# Patient Record
Sex: Female | Born: 1935 | Race: Black or African American | Hispanic: No | Smoking: Former smoker
Health system: Southern US, Community
[De-identification: ages and names within clinical notes are randomized; demographics above are authoritative.]

## PROBLEM LIST (undated history)

## (undated) DIAGNOSIS — G629 Polyneuropathy, unspecified: Secondary | ICD-10-CM

## (undated) DIAGNOSIS — C7931 Secondary malignant neoplasm of brain: Secondary | ICD-10-CM

## (undated) DIAGNOSIS — E785 Hyperlipidemia, unspecified: Secondary | ICD-10-CM

## (undated) DIAGNOSIS — M1990 Unspecified osteoarthritis, unspecified site: Secondary | ICD-10-CM

## (undated) DIAGNOSIS — Z905 Acquired absence of kidney: Secondary | ICD-10-CM

## (undated) DIAGNOSIS — G479 Sleep disorder, unspecified: Secondary | ICD-10-CM

## (undated) DIAGNOSIS — C3490 Malignant neoplasm of unspecified part of unspecified bronchus or lung: Secondary | ICD-10-CM

## (undated) DIAGNOSIS — R200 Anesthesia of skin: Secondary | ICD-10-CM

## (undated) DIAGNOSIS — Z5111 Encounter for antineoplastic chemotherapy: Secondary | ICD-10-CM

## (undated) DIAGNOSIS — K635 Polyp of colon: Secondary | ICD-10-CM

## (undated) DIAGNOSIS — C3492 Malignant neoplasm of unspecified part of left bronchus or lung: Principal | ICD-10-CM

## (undated) DIAGNOSIS — Z86711 Personal history of pulmonary embolism: Secondary | ICD-10-CM

## (undated) DIAGNOSIS — Z923 Personal history of irradiation: Secondary | ICD-10-CM

## (undated) HISTORY — DX: Malignant neoplasm of unspecified part of left bronchus or lung: C34.92

## (undated) HISTORY — DX: Polyp of colon: K63.5

## (undated) HISTORY — DX: Personal history of irradiation: Z92.3

## (undated) HISTORY — PX: ABDOMINAL HYSTERECTOMY: SHX81

## (undated) HISTORY — DX: Secondary malignant neoplasm of brain: C79.31

## (undated) HISTORY — DX: Encounter for antineoplastic chemotherapy: Z51.11

## (undated) HISTORY — PX: APPENDECTOMY: SHX54

## (undated) HISTORY — DX: Hyperlipidemia, unspecified: E78.5

## (undated) HISTORY — DX: Polyneuropathy, unspecified: G62.9

---

## 1991-10-21 HISTORY — PX: NEPHRECTOMY: SHX65

## 1992-10-20 DIAGNOSIS — Z905 Acquired absence of kidney: Secondary | ICD-10-CM

## 1992-10-20 HISTORY — DX: Acquired absence of kidney: Z90.5

## 2003-11-21 ENCOUNTER — Encounter: Admission: RE | Admit: 2003-11-21 | Discharge: 2003-11-21 | Payer: Self-pay | Admitting: Internal Medicine

## 2004-02-27 ENCOUNTER — Encounter: Admission: RE | Admit: 2004-02-27 | Discharge: 2004-02-27 | Payer: Self-pay | Admitting: Internal Medicine

## 2004-04-10 ENCOUNTER — Ambulatory Visit (HOSPITAL_COMMUNITY): Admission: RE | Admit: 2004-04-10 | Discharge: 2004-04-10 | Payer: Self-pay | Admitting: Gastroenterology

## 2005-03-06 ENCOUNTER — Encounter: Admission: RE | Admit: 2005-03-06 | Discharge: 2005-03-06 | Payer: Self-pay | Admitting: Internal Medicine

## 2005-10-31 ENCOUNTER — Encounter: Admission: RE | Admit: 2005-10-31 | Discharge: 2005-10-31 | Payer: Self-pay | Admitting: Internal Medicine

## 2006-04-03 ENCOUNTER — Encounter: Admission: RE | Admit: 2006-04-03 | Discharge: 2006-04-03 | Payer: Self-pay | Admitting: Internal Medicine

## 2007-05-10 ENCOUNTER — Encounter: Admission: RE | Admit: 2007-05-10 | Discharge: 2007-05-10 | Payer: Self-pay | Admitting: *Deleted

## 2008-04-01 ENCOUNTER — Emergency Department (HOSPITAL_COMMUNITY): Admission: EM | Admit: 2008-04-01 | Discharge: 2008-04-02 | Payer: Self-pay | Admitting: Emergency Medicine

## 2008-04-05 ENCOUNTER — Encounter: Admission: RE | Admit: 2008-04-05 | Discharge: 2008-04-05 | Payer: Self-pay | Admitting: Family Medicine

## 2008-05-10 ENCOUNTER — Encounter: Admission: RE | Admit: 2008-05-10 | Discharge: 2008-05-10 | Payer: Self-pay | Admitting: Family Medicine

## 2008-07-20 ENCOUNTER — Encounter: Admission: RE | Admit: 2008-07-20 | Discharge: 2008-08-21 | Payer: Self-pay | Admitting: Family Medicine

## 2009-10-20 DIAGNOSIS — Z86711 Personal history of pulmonary embolism: Secondary | ICD-10-CM

## 2009-10-20 HISTORY — DX: Personal history of pulmonary embolism: Z86.711

## 2009-11-05 ENCOUNTER — Encounter: Admission: RE | Admit: 2009-11-05 | Discharge: 2009-11-29 | Payer: Self-pay | Admitting: Family Medicine

## 2010-01-07 ENCOUNTER — Encounter: Admission: RE | Admit: 2010-01-07 | Discharge: 2010-01-07 | Payer: Self-pay | Admitting: Orthopedic Surgery

## 2010-02-23 ENCOUNTER — Encounter: Admission: RE | Admit: 2010-02-23 | Discharge: 2010-02-23 | Payer: Self-pay | Admitting: Orthopedic Surgery

## 2010-03-01 ENCOUNTER — Encounter: Admission: RE | Admit: 2010-03-01 | Discharge: 2010-03-01 | Payer: Self-pay | Admitting: Orthopedic Surgery

## 2010-03-15 ENCOUNTER — Encounter: Admission: RE | Admit: 2010-03-15 | Discharge: 2010-03-15 | Payer: Self-pay | Admitting: Orthopedic Surgery

## 2010-07-12 ENCOUNTER — Encounter: Admission: RE | Admit: 2010-07-12 | Discharge: 2010-07-12 | Payer: Self-pay | Admitting: Orthopedic Surgery

## 2010-08-06 ENCOUNTER — Inpatient Hospital Stay (HOSPITAL_COMMUNITY): Admission: EM | Admit: 2010-08-06 | Discharge: 2010-08-09 | Payer: Self-pay | Admitting: Emergency Medicine

## 2010-11-10 ENCOUNTER — Encounter: Payer: Self-pay | Admitting: Orthopedic Surgery

## 2011-01-01 LAB — POCT I-STAT, CHEM 8
BUN: 15 mg/dL (ref 6–23)
BUN: 17 mg/dL (ref 6–23)
Calcium, Ion: 1.04 mmol/L — ABNORMAL LOW (ref 1.12–1.32)
Calcium, Ion: 1.12 mmol/L (ref 1.12–1.32)
Chloride: 108 meq/L (ref 96–112)
Chloride: 111 mEq/L (ref 96–112)
Creatinine, Ser: 0.9 mg/dL (ref 0.4–1.2)
Creatinine, Ser: 1 mg/dL (ref 0.4–1.2)
Glucose, Bld: 102 mg/dL — ABNORMAL HIGH (ref 70–99)
Glucose, Bld: 121 mg/dL — ABNORMAL HIGH (ref 70–99)
HCT: 44 % (ref 36.0–46.0)
HCT: 45 % (ref 36.0–46.0)
Hemoglobin: 15 g/dL (ref 12.0–15.0)
Hemoglobin: 15.3 g/dL — ABNORMAL HIGH (ref 12.0–15.0)
Potassium: 3.9 meq/L (ref 3.5–5.1)
Potassium: 4 mEq/L (ref 3.5–5.1)
Sodium: 138 mEq/L (ref 135–145)
Sodium: 139 mEq/L (ref 135–145)
TCO2: 22 mmol/L (ref 0–100)
TCO2: 22 mmol/L (ref 0–100)

## 2011-01-01 LAB — BASIC METABOLIC PANEL
BUN: 16 mg/dL (ref 6–23)
CO2: 19 mEq/L (ref 19–32)
CO2: 25 mEq/L (ref 19–32)
Calcium: 9 mg/dL (ref 8.4–10.5)
Chloride: 109 mEq/L (ref 96–112)
Creatinine, Ser: 0.9 mg/dL (ref 0.4–1.2)
GFR calc Af Amer: >60 mL/min (ref >60)
Glucose, Bld: 106 mg/dL — ABNORMAL HIGH (ref 70–99)
Potassium: 4 mEq/L (ref 3.5–5.1)

## 2011-01-01 LAB — DIFFERENTIAL
Eosinophils Relative: 3 % (ref 0–5)
Lymphocytes Relative: 28 % (ref 12–46)
Lymphs Abs: 2.5 10*3/uL (ref 0.7–4.0)
Monocytes Relative: 11 % (ref 3–12)
Neutrophils Relative %: 58 % (ref 43–77)

## 2011-01-01 LAB — LUPUS ANTICOAGULANT PANEL
DRVVT: 49.7 s — ABNORMAL HIGH (ref 36.2–44.3)
Lupus Anticoagulant: NOT DETECTED
PTT Lupus Anticoagulant: 42.7 secs (ref 30.0–45.6)
dRVVT Incubated 1:1 Mix: 42.6 s (ref 36.2–44.3)

## 2011-01-01 LAB — POCT CARDIAC MARKERS
CKMB, poc: <1 ng/mL — ABNORMAL LOW (ref 1.0–8.0)
CKMB, poc: <1 ng/mL — ABNORMAL LOW (ref 1.0–8.0)
Myoglobin, poc: 62.6 ng/mL (ref 12–200)
Myoglobin, poc: 62.7 ng/mL (ref 12–200)
Troponin i, poc: <0.05 ng/mL (ref 0.00–0.09)
Troponin i, poc: <0.05 ng/mL (ref 0.00–0.09)

## 2011-01-01 LAB — CK TOTAL AND CKMB (NOT AT ARMC): CK, MB: 1.3 ng/mL (ref 0.3–4.0)

## 2011-01-01 LAB — BETA-2-GLYCOPROTEIN I ABS, IGG/M/A
Beta-2 Glyco I IgG: 1 G Units (ref <20)
Beta-2-Glycoprotein I IgA: 4 A Units (ref <20)
Beta-2-Glycoprotein I IgM: 1 M Units (ref <20)

## 2011-01-01 LAB — CARDIOLIPIN ANTIBODIES, IGG, IGM, IGA
Anticardiolipin IgA: 6 APL U/mL — ABNORMAL LOW (ref <22)
Anticardiolipin IgG: 31 GPL U/mL — ABNORMAL HIGH (ref <23)
Anticardiolipin IgM: 1 [MPL'U]/mL — ABNORMAL LOW (ref <11)

## 2011-01-01 LAB — CARDIAC PANEL(CRET KIN+CKTOT+MB+TROPI)
CK, MB: 1.3 ng/mL (ref 0.3–4.0)
Relative Index: INVALID (ref 0.0–2.5)
Relative Index: INVALID (ref 0.0–2.5)
Troponin I: 0.01 ng/mL (ref 0.00–0.06)

## 2011-01-01 LAB — PROTEIN C ACTIVITY: Protein C Activity: 127 % (ref 75–133)

## 2011-01-01 LAB — CBC
HCT: 41.8 % (ref 36.0–46.0)
HCT: 42.4 % (ref 36.0–46.0)
Hemoglobin: 14.7 g/dL (ref 12.0–15.0)
MCH: 31 pg (ref 26.0–34.0)
MCH: 32.1 pg (ref 26.0–34.0)
MCHC: 34.7 g/dL (ref 30.0–36.0)
MCV: 90 fL (ref 78.0–100.0)
MCV: 90.5 fL (ref 78.0–100.0)
Platelets: 184 10*3/uL (ref 150–400)
Platelets: 242 10*3/uL (ref 150–400)
RBC: 4.62 MIL/uL (ref 3.87–5.11)
RDW: 13.2 % (ref 11.5–15.5)
WBC: 8.2 10*3/uL (ref 4.0–10.5)
WBC: 8.9 10*3/uL (ref 4.0–10.5)

## 2011-01-01 LAB — TSH: TSH: 1.574 u[IU]/mL (ref 0.350–4.500)

## 2011-01-01 LAB — PROTIME-INR
INR: 1.12 (ref 0.00–1.49)
Prothrombin Time: 14.1 seconds (ref 11.6–15.2)
Prothrombin Time: 14.6 seconds (ref 11.6–15.2)
Prothrombin Time: 15.7 seconds — ABNORMAL HIGH (ref 11.6–15.2)

## 2011-01-01 LAB — ANTITHROMBIN III: AntiThromb III Func: 108 % (ref 76–126)

## 2011-01-01 LAB — LIPID PANEL
HDL: 45 mg/dL (ref >39)
Total CHOL/HDL Ratio: 3.8 RATIO

## 2011-01-01 LAB — PROTEIN C, TOTAL: Protein C, Total: 105 % (ref 70–140)

## 2011-01-01 LAB — APTT: aPTT: 33 seconds (ref 24–37)

## 2011-01-01 LAB — HOMOCYSTEINE: Homocysteine: 11 umol/L (ref 4.0–15.4)

## 2011-01-01 LAB — PROTHROMBIN GENE MUTATION

## 2011-01-01 LAB — TROPONIN I: Troponin I: 0.02 ng/mL (ref 0.00–0.06)

## 2011-01-01 LAB — PROTEIN S ACTIVITY: Protein S Activity: 117 % (ref 69–129)

## 2011-01-01 LAB — PROTEIN S, TOTAL: Protein S Ag, Total: 191 % — ABNORMAL HIGH (ref 70–140)

## 2011-01-01 LAB — FACTOR 5 LEIDEN

## 2011-03-07 NOTE — Op Note (Signed)
[name], [name] NO.:  [id number]   MEDICAL RECORD NO.:  [id number]                   PATIENT TYPE:  AMB   LOCATION:  ENDO                                 FACILITY:  [facility]   PHYSICIAN:  Graylin Shiver, M.D.                DATE OF BIRTH:  22-Jun-1928   DATE OF PROCEDURE:  04/10/2004  DATE OF DISCHARGE:                                 OPERATIVE REPORT   PROCEDURE PERFORMED:  Colonoscopy.   INDICATIONS FOR PROCEDURE:  History of colon polyps.  Family history of  colon cancer.   Informed consent was obtained after explanation of the risks of bleeding,  infection, and perforation.   PREMEDICATIONS:  Fentanyl 60 mcg  IV, Versed 6 mg IV.   DESCRIPTION OF PROCEDURE:  With the patient in the left lateral decubitus  position, a rectal exam was performed and no masses were felt.  The Olympus  colonoscope was inserted into the rectum and advanced around the colon to  the cecum.  Cecal landmarks were identified.  The cecum and ascending colon  were normal.  The transverse colon normal.  The descending colon normal.  The sigmoid showed diverticulosis.  The rectum was normal.  The patient  tolerated the procedure well without complications.   IMPRESSION:  Diverticulosis of the sigmoid.   RECOMMENDATIONS:  I would recommend a follow-up colonoscopy again in five  years.                                               Graylin Shiver, M.D.    [name]  D:  04/10/2004  T:  04/11/2004  Job:  [id number]   cc:   Marcene Duos, M.D.  [place]  [place]  [place] [address]  Fax: [phone]

## 2011-04-28 ENCOUNTER — Other Ambulatory Visit: Payer: Self-pay | Admitting: Family Medicine

## 2011-04-28 DIAGNOSIS — Z780 Asymptomatic menopausal state: Secondary | ICD-10-CM

## 2011-04-28 DIAGNOSIS — Z1231 Encounter for screening mammogram for malignant neoplasm of breast: Secondary | ICD-10-CM

## 2011-05-21 ENCOUNTER — Ambulatory Visit
Admission: RE | Admit: 2011-05-21 | Discharge: 2011-05-21 | Disposition: A | Discharge status: Home / Self Care | Payer: BC Managed Care – PPO | Source: Ambulatory Visit | Attending: Family Medicine | Admitting: Family Medicine

## 2011-05-21 DIAGNOSIS — Z1231 Encounter for screening mammogram for malignant neoplasm of breast: Secondary | ICD-10-CM

## 2011-05-21 DIAGNOSIS — Z780 Asymptomatic menopausal state: Secondary | ICD-10-CM

## 2012-06-09 ENCOUNTER — Other Ambulatory Visit: Payer: Self-pay | Admitting: Family Medicine

## 2012-06-09 DIAGNOSIS — Z1231 Encounter for screening mammogram for malignant neoplasm of breast: Secondary | ICD-10-CM

## 2012-06-15 ENCOUNTER — Ambulatory Visit
Admission: RE | Admit: 2012-06-15 | Discharge: 2012-06-15 | Disposition: A | Discharge status: Home / Self Care | Payer: Medicare Other | Source: Ambulatory Visit | Attending: Family Medicine | Admitting: Family Medicine

## 2012-06-15 DIAGNOSIS — Z1231 Encounter for screening mammogram for malignant neoplasm of breast: Secondary | ICD-10-CM

## 2013-01-05 ENCOUNTER — Other Ambulatory Visit: Payer: Self-pay | Admitting: Neurology

## 2013-01-05 ENCOUNTER — Other Ambulatory Visit: Payer: Self-pay

## 2013-01-05 ENCOUNTER — Ambulatory Visit (INDEPENDENT_AMBULATORY_CARE_PROVIDER_SITE_OTHER): Payer: Medicare Other | Admitting: Neurology

## 2013-01-05 ENCOUNTER — Encounter: Payer: Self-pay | Admitting: Neurology

## 2013-01-05 VITALS — BP 132/84 | HR 93 | Ht 67.0 in | Wt 273.0 lb

## 2013-01-05 DIAGNOSIS — G609 Hereditary and idiopathic neuropathy, unspecified: Secondary | ICD-10-CM

## 2013-01-05 NOTE — Patient Instructions (Addendum)
Laboratory evaluation today,  Return to clinic for EMG nerve conduction study. Tapering off Lyrica 75 mg over 1-2 weeks

## 2013-01-05 NOTE — Progress Notes (Signed)
[name] is apresent 79 years old right-handed Caucasian female, referred by her primary care physician Dr. Rosario Adie from [facility] for evaluation of peripheral neuropathy.  She has past medical history of bilateral pulmonary emboli, on chronic Coumadin treatment, hyperlipidemia, previous history of severe endometriosis, with total hysterectomy, also right nephrectomy due to erosion from endometriosis, chronic right hip pain, is considering right hip replacement  She reports 5 years history of carotid onset bilateral feet paresthesia, initial symptoms was left toes, plantar surface, over the past 4 years, gradually involving her right foot, occasionally tingling discomfort at the distal leg, she now complains of constant bilateral toes, plantar foot numbness, no pain, no needle prick sensation, no burning, she has mild gait difficulty due to the right hip pain, she denies significant neck pain, no incontinence, no finger paresthesia  Review of system: She complains of numbness, otherwise deny constitutional, cardiac, pulmonary, ENT, skin, respiratory, GI, endocrinology complains,   PHYSICAL EXAMINATOINS:  Generalized: In no acute distress  Neck: Supple, no carotid bruits   Cardiac: Regular rate rhythm  Pulmonary: Clear to auscultation bilaterally  Musculoskeletal: No deformity  Neurological examination  Mentation: Alert oriented to time, place, history taking, and causual conversation  Cranial nerve II-XII: Pupils were equal round reactive to light extraocular movements were full, visual field were full on confrontational test. facial sensation and strength were normal. hearing was intact to finger rubbing bilaterally. Uvula tongue midline.  head turning and shoulder shrug and were normal and symmetric.Tongue protrusion into cheek strength was normal.  Motor: normal tone, bulk and strength.  Sensory: mildly length dependent decreased light touch, pinprick, preserved vibratory  sensation, and proprioception at toes.  Coordination: Normal finger to nose, heel-to-shin bilaterally there was no truncal ataxia  Gait: Rising up from seated position by pushing in chair arm, normal stance, without trunk ataxia, moderate stride, dragging right leg, limp due to right hip pain. Romberg signs: Negative  Deep tendon reflexes: Brachioradialis 2/2, biceps 2/2, triceps 2/2, patellar 2/2, Achilles 2/2, plantar responses were flexor bilaterally.  Assessment and plan:  79 years old Caucasian female, with gradual onset bilateral feet paresthesia, most consistent with small fiber neuropathy, she wants to complete evaluation before proceeding to right hip replacement,   1, EMG nerve conduction study 2 laboratory evaluation.

## 2013-01-06 LAB — PROTEIN ELECTROPHORESIS, SERUM
A/G Ratio: 1.1 (ref 0.7–2.0)
Albumin ELP: 3.6 g/dL (ref 3.2–5.6)
Gamma Globulin: 1.4 g/dL (ref 0.5–1.6)
Globulin, Total: 3.3 g/dL (ref 2.0–4.5)
Total Protein: 6.9 g/dL (ref 6.0–8.5)

## 2013-01-06 LAB — CBC

## 2013-01-07 ENCOUNTER — Encounter: Payer: Self-pay | Admitting: Neurology

## 2013-01-07 LAB — CBC
HCT: 43.3 % (ref 34.0–46.6)
MCV: 92 fL (ref 79–97)
RBC: 4.71 x10E6/uL (ref 3.77–5.28)
RDW: 15.2 % (ref 12.3–15.4)
WBC: 7.8 10*3/uL (ref 3.4–10.8)

## 2013-01-07 LAB — SPECIMEN STATUS REPORT

## 2013-01-10 ENCOUNTER — Telehealth: Payer: Self-pay

## 2013-01-10 NOTE — Telephone Encounter (Signed)
Patient wants to no about her lab results what labs did she have done .

## 2013-01-10 NOTE — Telephone Encounter (Signed)
Please call her, CMP, CBC, protein electrophoresis, RPR, Vitamin B12 level were all normal.

## 2013-01-20 NOTE — Telephone Encounter (Signed)
Called and spoke to patient normal labs. Patient wants labs to be faxed to her PCP.

## 2013-01-28 ENCOUNTER — Encounter: Payer: Medicare Other | Admitting: Radiology

## 2013-01-28 ENCOUNTER — Ambulatory Visit (INDEPENDENT_AMBULATORY_CARE_PROVIDER_SITE_OTHER): Payer: Medicare Other | Admitting: Neurology

## 2013-01-28 ENCOUNTER — Encounter (INDEPENDENT_AMBULATORY_CARE_PROVIDER_SITE_OTHER): Payer: Medicare Other

## 2013-01-28 DIAGNOSIS — R200 Anesthesia of skin: Secondary | ICD-10-CM

## 2013-01-28 DIAGNOSIS — R209 Unspecified disturbances of skin sensation: Secondary | ICD-10-CM

## 2013-01-28 DIAGNOSIS — Z0289 Encounter for other administrative examinations: Secondary | ICD-10-CM

## 2013-01-28 NOTE — Procedures (Signed)
79 year old female, with chronic right hip pain, a candidate for right hip replacement, also presenting with two-month history of bilateral feet paresthesia, numbness tingling at the bottom of her feet, initially only involving her left foot, now also involving her right foot, she denies significant low back pain  Examination: Bilateral lower  Extremity motor strength is normal, mildly length dependent sensory changes, trace ankle reflex  Nerve conduction studies:  Bilateral peroneal sensory responses were normal. Bilateral peroneal to EDB motor responses were normal.  Bilateral tibial motor responses were normal.  bilateral tibial H. reflexes were normal and symmetric,  Electromyography:  Selected needle examination was performed at left lower extremity muscles, and left lumbosacral paraspinal muscles.  Needle examination of left tibialis anterior, tibialis posterior, peroneal longus, vastus lateralis was normal,  There was no spontaneous activity at left lumbosacral paraspinal muscles, left L4, L5, S1  In conclusion:  This is a normal study, there is no electrodiagnostic evidence of large fiber peripheral neuropathy, or left lumbosacral radiculopathy, patient's symptoms suggestive of a small fiber neuropathy

## 2013-02-09 ENCOUNTER — Other Ambulatory Visit (HOSPITAL_COMMUNITY): Payer: Self-pay | Admitting: Orthopaedic Surgery

## 2013-02-16 ENCOUNTER — Encounter (HOSPITAL_COMMUNITY): Payer: Self-pay | Admitting: Pharmacy Technician

## 2013-02-17 ENCOUNTER — Encounter (HOSPITAL_COMMUNITY): Payer: Self-pay

## 2013-02-17 ENCOUNTER — Other Ambulatory Visit (HOSPITAL_COMMUNITY): Payer: Self-pay | Admitting: *Deleted

## 2013-02-17 ENCOUNTER — Encounter (HOSPITAL_COMMUNITY)
Admission: RE | Admit: 2013-02-17 | Discharge: 2013-02-17 | Disposition: A | Discharge status: Home / Self Care | Payer: Medicare Other | Source: Ambulatory Visit | Attending: Orthopaedic Surgery | Admitting: Orthopaedic Surgery

## 2013-02-17 ENCOUNTER — Ambulatory Visit (HOSPITAL_COMMUNITY)
Admission: RE | Admit: 2013-02-17 | Discharge: 2013-02-17 | Disposition: A | Discharge status: Home / Self Care | Payer: Medicare Other | Source: Ambulatory Visit | Attending: Orthopaedic Surgery | Admitting: Orthopaedic Surgery

## 2013-02-17 DIAGNOSIS — Z01812 Encounter for preprocedural laboratory examination: Secondary | ICD-10-CM | POA: Insufficient documentation

## 2013-02-17 DIAGNOSIS — M169 Osteoarthritis of hip, unspecified: Secondary | ICD-10-CM | POA: Insufficient documentation

## 2013-02-17 DIAGNOSIS — Z01818 Encounter for other preprocedural examination: Secondary | ICD-10-CM | POA: Insufficient documentation

## 2013-02-17 DIAGNOSIS — Z86711 Personal history of pulmonary embolism: Secondary | ICD-10-CM | POA: Insufficient documentation

## 2013-02-17 DIAGNOSIS — M1610 Unilateral primary osteoarthritis, unspecified hip: Secondary | ICD-10-CM | POA: Insufficient documentation

## 2013-02-17 DIAGNOSIS — Z0183 Encounter for blood typing: Secondary | ICD-10-CM | POA: Insufficient documentation

## 2013-02-17 DIAGNOSIS — R82998 Other abnormal findings in urine: Secondary | ICD-10-CM | POA: Insufficient documentation

## 2013-02-17 DIAGNOSIS — R911 Solitary pulmonary nodule: Secondary | ICD-10-CM | POA: Insufficient documentation

## 2013-02-17 DIAGNOSIS — I700 Atherosclerosis of aorta: Secondary | ICD-10-CM | POA: Insufficient documentation

## 2013-02-17 DIAGNOSIS — Z87891 Personal history of nicotine dependence: Secondary | ICD-10-CM | POA: Insufficient documentation

## 2013-02-17 HISTORY — DX: Anesthesia of skin: R20.0

## 2013-02-17 HISTORY — DX: Personal history of pulmonary embolism: Z86.711

## 2013-02-17 HISTORY — DX: Sleep disorder, unspecified: G47.9

## 2013-02-17 HISTORY — DX: Acquired absence of kidney: Z90.5

## 2013-02-17 HISTORY — DX: Unspecified osteoarthritis, unspecified site: M19.90

## 2013-02-17 LAB — BASIC METABOLIC PANEL
Calcium: 9.6 mg/dL (ref 8.4–10.5)
GFR calc non Af Amer: 47 mL/min — ABNORMAL LOW (ref >90)
Potassium: 5.1 mEq/L (ref 3.5–5.1)
Sodium: 138 mEq/L (ref 135–145)

## 2013-02-17 LAB — URINALYSIS, ROUTINE W REFLEX MICROSCOPIC
Nitrite: NEGATIVE
Specific Gravity, Urine: 1.017 (ref 1.005–1.030)
pH: 5 (ref 5.0–8.0)

## 2013-02-17 LAB — PROTIME-INR
INR: 2.41 — ABNORMAL HIGH (ref 0.00–1.49)
Prothrombin Time: 25.1 seconds — ABNORMAL HIGH (ref 11.6–15.2)

## 2013-02-17 LAB — APTT: aPTT: 52 seconds — ABNORMAL HIGH (ref 24–37)

## 2013-02-17 LAB — CBC
Platelets: 249 10*3/uL (ref 150–400)
RBC: 4.86 MIL/uL (ref 3.87–5.11)
WBC: 7 10*3/uL (ref 4.0–10.5)

## 2013-02-17 LAB — URINE MICROSCOPIC-ADD ON

## 2013-02-17 NOTE — Patient Instructions (Addendum)
TIMBERLY YOTT  02/17/2013                           YOUR PROCEDURE IS SCHEDULED ON: 02/25/13               PLEASE REPORT TO SHORT STAY CENTER AT : 7:30 AM               CALL THIS NUMBER IF ANY PROBLEMS THE DAY OF SURGERY :               [phone]                      REMEMBER:   Do not eat food or drink liquids AFTER MIDNIGHT   Take these medicines the morning of surgery with A SIP OF WATER:  METOPROLOL   Do not wear jewelry, make-up   Do not wear lotions, powders, or perfumes.   Do not shave legs or underarms 12 hrs. before surgery (men may shave face)  Do not bring valuables to the hospital.  Contacts, dentures or bridgework may not be worn into surgery.  Leave suitcase in the car. After surgery it may be brought to your room.  For patients admitted to the hospital more than one night, checkout time is 11:00                          The day of discharge.   Patients discharged the day of surgery will not be allowed to drive home                             If going home same day of surgery, must have someone stay with you first                           24 hrs at home and arrange for some one to drive you home from hospital.    Special Instructions:   Please read over the following fact sheets that you were given:               1. MRSA  INFORMATION                      2.  PREPARING FOR SURGERY SHEET                                                X_____________________________________________________________________        Failure to follow these instructions may result in cancellation of your surgery

## 2013-02-18 NOTE — Progress Notes (Signed)
Abnormal UA faxed to Dr, Magnus Ivan -also spoke with Cordelia Pen at office - to review UA

## 2013-02-19 LAB — URINE CULTURE

## 2013-02-25 ENCOUNTER — Encounter (HOSPITAL_COMMUNITY): Payer: Self-pay | Admitting: Anesthesiology

## 2013-02-25 ENCOUNTER — Encounter (HOSPITAL_COMMUNITY)
Admission: RE | Disposition: A | Discharge status: Skilled Nursing Facility | Payer: Self-pay | Source: Ambulatory Visit | Attending: Orthopaedic Surgery

## 2013-02-25 ENCOUNTER — Inpatient Hospital Stay (HOSPITAL_COMMUNITY): Payer: Medicare Other

## 2013-02-25 ENCOUNTER — Inpatient Hospital Stay (HOSPITAL_COMMUNITY)
Admission: RE | Admit: 2013-02-25 | Discharge: 2013-02-28 | DRG: 470 | Disposition: A | Discharge status: Skilled Nursing Facility | Payer: Medicare Other | Source: Ambulatory Visit | Attending: Orthopaedic Surgery | Admitting: Orthopaedic Surgery

## 2013-02-25 ENCOUNTER — Encounter (HOSPITAL_COMMUNITY): Payer: Self-pay | Admitting: *Deleted

## 2013-02-25 ENCOUNTER — Inpatient Hospital Stay (HOSPITAL_COMMUNITY): Payer: Medicare Other | Admitting: Anesthesiology

## 2013-02-25 DIAGNOSIS — M1610 Unilateral primary osteoarthritis, unspecified hip: Principal | ICD-10-CM | POA: Diagnosis present

## 2013-02-25 DIAGNOSIS — G609 Hereditary and idiopathic neuropathy, unspecified: Secondary | ICD-10-CM | POA: Diagnosis present

## 2013-02-25 DIAGNOSIS — E785 Hyperlipidemia, unspecified: Secondary | ICD-10-CM | POA: Diagnosis present

## 2013-02-25 DIAGNOSIS — Z95810 Presence of automatic (implantable) cardiac defibrillator: Secondary | ICD-10-CM

## 2013-02-25 DIAGNOSIS — M169 Osteoarthritis of hip, unspecified: Secondary | ICD-10-CM

## 2013-02-25 DIAGNOSIS — Z880 Allergy status to penicillin: Secondary | ICD-10-CM

## 2013-02-25 DIAGNOSIS — Z86711 Personal history of pulmonary embolism: Secondary | ICD-10-CM

## 2013-02-25 DIAGNOSIS — D62 Acute posthemorrhagic anemia: Secondary | ICD-10-CM | POA: Diagnosis not present

## 2013-02-25 HISTORY — PX: TOTAL HIP ARTHROPLASTY: SHX124

## 2013-02-25 LAB — PROTIME-INR
INR: 1.15 (ref 0.00–1.49)
Prothrombin Time: 14.5 seconds (ref 11.6–15.2)

## 2013-02-25 SURGERY — ARTHROPLASTY, HIP, TOTAL, ANTERIOR APPROACH
Anesthesia: Spinal | Site: Hip | Laterality: Right | Wound class: Clean

## 2013-02-25 MED ORDER — POLYETHYLENE GLYCOL 3350 17 G PO PACK
17.0000 g | PACK | Freq: Every day | ORAL | Status: DC | PRN
  Filled 2013-02-25: qty 1

## 2013-02-25 MED ORDER — CLINDAMYCIN PHOSPHATE 900 MG/50ML IV SOLN
900.0000 mg | INTRAVENOUS | Status: AC
  Administered 2013-02-25: 900 mg via INTRAVENOUS
  Filled 2013-02-25: qty 50

## 2013-02-25 MED ORDER — WARFARIN - PHARMACIST DOSING INPATIENT: Freq: Every day | Status: DC

## 2013-02-25 MED ORDER — FLEET ENEMA 7-19 GM/118ML RE ENEM: 1.0000 | ENEMA | Freq: Once | RECTAL | Status: AC | PRN

## 2013-02-25 MED ORDER — ONDANSETRON HCL 4 MG PO TABS
4.0000 mg | ORAL_TABLET | Freq: Four times a day (QID) | ORAL | Status: DC | PRN
  Administered 2013-02-26: 4 mg via ORAL
  Filled 2013-02-25: qty 1

## 2013-02-25 MED ORDER — 0.9 % SODIUM CHLORIDE (POUR BTL) OPTIME
TOPICAL | Status: DC | PRN
  Administered 2013-02-25: 1000 mL

## 2013-02-25 MED ORDER — PHENOL 1.4 % MT LIQD
1.0000 | OROMUCOSAL | Status: DC | PRN
  Filled 2013-02-25: qty 177

## 2013-02-25 MED ORDER — DOCUSATE SODIUM 100 MG PO CAPS
100.0000 mg | ORAL_CAPSULE | Freq: Two times a day (BID) | ORAL | Status: DC
  Administered 2013-02-25 – 2013-02-28 (×7): 100 mg via ORAL
  Filled 2013-02-25 (×5): qty 1

## 2013-02-25 MED ORDER — METOCLOPRAMIDE HCL 5 MG/ML IJ SOLN: 5.0000 mg | Freq: Three times a day (TID) | INTRAMUSCULAR | Status: DC | PRN

## 2013-02-25 MED ORDER — HYDROMORPHONE HCL PF 1 MG/ML IJ SOLN: 0.5000 mg | INTRAMUSCULAR | Status: DC | PRN

## 2013-02-25 MED ORDER — DIPHENHYDRAMINE HCL 12.5 MG/5ML PO ELIX: 12.5000 mg | ORAL_SOLUTION | ORAL | Status: DC | PRN

## 2013-02-25 MED ORDER — ZOLPIDEM TARTRATE 5 MG PO TABS: 5.0000 mg | ORAL_TABLET | Freq: Every evening | ORAL | Status: DC | PRN

## 2013-02-25 MED ORDER — OXYCODONE HCL 5 MG PO TABS
5.0000 mg | ORAL_TABLET | ORAL | Status: DC | PRN
  Administered 2013-02-25 – 2013-02-26 (×2): 10 mg via ORAL
  Filled 2013-02-25 (×2): qty 2

## 2013-02-25 MED ORDER — WARFARIN SODIUM 7.5 MG PO TABS
7.5000 mg | ORAL_TABLET | Freq: Once | ORAL | Status: AC
  Administered 2013-02-25: 7.5 mg via ORAL
  Filled 2013-02-25: qty 1

## 2013-02-25 MED ORDER — METHOCARBAMOL 500 MG PO TABS
500.0000 mg | ORAL_TABLET | Freq: Four times a day (QID) | ORAL | Status: DC | PRN
  Administered 2013-02-25 – 2013-02-26 (×3): 500 mg via ORAL
  Filled 2013-02-25 (×3): qty 1

## 2013-02-25 MED ORDER — ACETAMINOPHEN 10 MG/ML IV SOLN
INTRAVENOUS | Status: DC | PRN
  Administered 2013-02-25: 1000 mg via INTRAVENOUS

## 2013-02-25 MED ORDER — SODIUM CHLORIDE 0.9 % IR SOLN
Status: DC | PRN
  Administered 2013-02-25: 1000 mL

## 2013-02-25 MED ORDER — ACETAMINOPHEN 650 MG RE SUPP: 650.0000 mg | Freq: Four times a day (QID) | RECTAL | Status: DC | PRN

## 2013-02-25 MED ORDER — OXYCODONE HCL ER 10 MG PO T12A
10.0000 mg | EXTENDED_RELEASE_TABLET | Freq: Two times a day (BID) | ORAL | Status: DC
  Administered 2013-02-25 – 2013-02-28 (×7): 10 mg via ORAL
  Filled 2013-02-25 (×8): qty 1

## 2013-02-25 MED ORDER — FENTANYL CITRATE 0.05 MG/ML IJ SOLN: 25.0000 ug | INTRAMUSCULAR | Status: DC | PRN

## 2013-02-25 MED ORDER — SODIUM CHLORIDE 0.9 % IV SOLN
INTRAVENOUS | Status: DC
  Administered 2013-02-25: 13:00:00 via INTRAVENOUS

## 2013-02-25 MED ORDER — FENTANYL CITRATE 0.05 MG/ML IJ SOLN
INTRAMUSCULAR | Status: DC | PRN
  Administered 2013-02-25: 50 ug via INTRAVENOUS

## 2013-02-25 MED ORDER — ONDANSETRON HCL 4 MG/2ML IJ SOLN
4.0000 mg | Freq: Four times a day (QID) | INTRAMUSCULAR | Status: DC | PRN
  Administered 2013-02-25: 4 mg via INTRAVENOUS
  Filled 2013-02-25: qty 2

## 2013-02-25 MED ORDER — ALUM & MAG HYDROXIDE-SIMETH 200-200-20 MG/5ML PO SUSP: 30.0000 mL | ORAL | Status: DC | PRN

## 2013-02-25 MED ORDER — PROPOFOL 10 MG/ML IV BOLUS
INTRAVENOUS | Status: DC | PRN
  Administered 2013-02-25: 25 mg via INTRAVENOUS

## 2013-02-25 MED ORDER — METOCLOPRAMIDE HCL 10 MG PO TABS: 5.0000 mg | ORAL_TABLET | Freq: Three times a day (TID) | ORAL | Status: DC | PRN

## 2013-02-25 MED ORDER — PROPOFOL INFUSION 10 MG/ML OPTIME
INTRAVENOUS | Status: DC | PRN
  Administered 2013-02-25: 140 ug/kg/min via INTRAVENOUS

## 2013-02-25 MED ORDER — PHENYLEPHRINE HCL 10 MG/ML IJ SOLN
10.0000 mg | INTRAVENOUS | Status: DC | PRN
  Administered 2013-02-25: 25 ug/min via INTRAVENOUS

## 2013-02-25 MED ORDER — ADULT MULTIVITAMIN W/MINERALS CH
1.0000 | ORAL_TABLET | Freq: Every day | ORAL | Status: DC
  Administered 2013-02-25 – 2013-02-28 (×4): 1 via ORAL
  Filled 2013-02-25 (×4): qty 1

## 2013-02-25 MED ORDER — BISACODYL 10 MG RE SUPP: 10.0000 mg | Freq: Every day | RECTAL | Status: DC | PRN

## 2013-02-25 MED ORDER — LACTATED RINGERS IV SOLN
INTRAVENOUS | Status: DC
  Administered 2013-02-25: 11:00:00 via INTRAVENOUS
  Administered 2013-02-25: 1000 mL via INTRAVENOUS
  Administered 2013-02-25: 11:00:00 via INTRAVENOUS

## 2013-02-25 MED ORDER — MEPERIDINE HCL 50 MG/ML IJ SOLN: 6.2500 mg | INTRAMUSCULAR | Status: DC | PRN

## 2013-02-25 MED ORDER — EPHEDRINE SULFATE 50 MG/ML IJ SOLN
INTRAMUSCULAR | Status: DC | PRN
  Administered 2013-02-25 (×3): 10 mg via INTRAVENOUS

## 2013-02-25 MED ORDER — MIDAZOLAM HCL 5 MG/5ML IJ SOLN
INTRAMUSCULAR | Status: DC | PRN
  Administered 2013-02-25: 2 mg via INTRAVENOUS

## 2013-02-25 MED ORDER — METOPROLOL SUCCINATE ER 25 MG PO TB24
25.0000 mg | ORAL_TABLET | Freq: Every morning | ORAL | Status: DC
  Administered 2013-02-27 – 2013-02-28 (×2): 25 mg via ORAL
  Filled 2013-02-25 (×4): qty 1

## 2013-02-25 MED ORDER — MENTHOL 3 MG MT LOZG
1.0000 | LOZENGE | OROMUCOSAL | Status: DC | PRN
  Filled 2013-02-25: qty 9

## 2013-02-25 MED ORDER — METHOCARBAMOL 100 MG/ML IJ SOLN
500.0000 mg | Freq: Four times a day (QID) | INTRAVENOUS | Status: DC | PRN
  Filled 2013-02-25: qty 5

## 2013-02-25 MED ORDER — FERROUS SULFATE 325 (65 FE) MG PO TABS
325.0000 mg | ORAL_TABLET | Freq: Three times a day (TID) | ORAL | Status: DC
  Administered 2013-02-25 – 2013-02-28 (×8): 325 mg via ORAL
  Filled 2013-02-25 (×12): qty 1

## 2013-02-25 MED ORDER — PROMETHAZINE HCL 25 MG/ML IJ SOLN: 6.2500 mg | INTRAMUSCULAR | Status: DC | PRN

## 2013-02-25 MED ORDER — CLINDAMYCIN PHOSPHATE 600 MG/50ML IV SOLN
600.0000 mg | Freq: Four times a day (QID) | INTRAVENOUS | Status: AC
  Administered 2013-02-25 (×2): 600 mg via INTRAVENOUS
  Filled 2013-02-25 (×3): qty 50

## 2013-02-25 MED ORDER — ACETAMINOPHEN 325 MG PO TABS: 650.0000 mg | ORAL_TABLET | Freq: Four times a day (QID) | ORAL | Status: DC | PRN

## 2013-02-25 MED ORDER — BUPIVACAINE HCL (PF) 0.5 % IJ SOLN
INTRAMUSCULAR | Status: DC | PRN
  Administered 2013-02-25: 3 mL

## 2013-02-25 SURGICAL SUPPLY — 36 items
BAG ZIPLOCK 12X15 (MISCELLANEOUS) ×2 IMPLANT
BLADE SAW SGTL 18X1.27X75 (BLADE) ×2 IMPLANT
CELLS DAT CNTRL 66122 CELL SVR (MISCELLANEOUS) ×1 IMPLANT
CLOTH BEACON ORANGE TIMEOUT ST (SAFETY) ×2 IMPLANT
DERMABOND ADVANCED (GAUZE/BANDAGES/DRESSINGS) ×1
DERMABOND ADVANCED .7 DNX12 (GAUZE/BANDAGES/DRESSINGS) ×1 IMPLANT
DRAPE C-ARM 42X72 X-RAY (DRAPES) ×2 IMPLANT
DRAPE STERI IOBAN 125X83 (DRAPES) ×2 IMPLANT
DRAPE U-SHAPE 47X51 STRL (DRAPES) ×6 IMPLANT
DRSG AQUACEL AG ADV 3.5X10 (GAUZE/BANDAGES/DRESSINGS) ×2 IMPLANT
DURAPREP 26ML APPLICATOR (WOUND CARE) ×2 IMPLANT
ELECT BLADE TIP CTD 4 INCH (ELECTRODE) ×2 IMPLANT
ELECT REM PT RETURN 9FT ADLT (ELECTROSURGICAL) ×2
ELECTRODE REM PT RTRN 9FT ADLT (ELECTROSURGICAL) ×1 IMPLANT
FACESHIELD LNG OPTICON STERILE (SAFETY) ×8 IMPLANT
GLOVE BIO SURGEON STRL SZ7.5 (GLOVE) ×2 IMPLANT
GLOVE BIOGEL PI IND STRL 8 (GLOVE) ×2 IMPLANT
GLOVE BIOGEL PI INDICATOR 8 (GLOVE) ×2
GLOVE ECLIPSE 8.0 STRL XLNG CF (GLOVE) ×2 IMPLANT
GOWN STRL REIN XL XLG (GOWN DISPOSABLE) ×6 IMPLANT
HANDPIECE INTERPULSE COAX TIP (DISPOSABLE) ×1
KIT BASIN OR (CUSTOM PROCEDURE TRAY) ×2 IMPLANT
PACK TOTAL JOINT (CUSTOM PROCEDURE TRAY) ×2 IMPLANT
PADDING CAST COTTON 6X4 STRL (CAST SUPPLIES) ×2 IMPLANT
RTRCTR WOUND ALEXIS 18CM MED (MISCELLANEOUS) ×2
SET HNDPC FAN SPRY TIP SCT (DISPOSABLE) ×1 IMPLANT
SUT ETHIBOND NAB CT1 #1 30IN (SUTURE) ×4 IMPLANT
SUT MNCRL AB 4-0 PS2 18 (SUTURE) ×2 IMPLANT
SUT NYLON 3 0 (SUTURE) IMPLANT
SUT VIC AB 0 CT1 36 (SUTURE) ×2 IMPLANT
SUT VIC AB 1 CT1 36 (SUTURE) ×2 IMPLANT
SUT VIC AB 2-0 CT1 27 (SUTURE) ×1
SUT VIC AB 2-0 CT1 TAPERPNT 27 (SUTURE) ×1 IMPLANT
TOWEL OR 17X26 10 PK STRL BLUE (TOWEL DISPOSABLE) ×2 IMPLANT
TOWEL OR NON WOVEN STRL DISP B (DISPOSABLE) ×2 IMPLANT
TRAY FOLEY CATH 14FRSI W/METER (CATHETERS) ×2 IMPLANT

## 2013-02-25 NOTE — Anesthesia Postprocedure Evaluation (Signed)
  Anesthesia Post-op Note  Patient: [name]  Procedure(s) Performed: Procedure(s) (LRB): RIGHT TOTAL HIP ARTHROPLASTY ANTERIOR APPROACH (Right)  Patient Location: PACU  Anesthesia Type: Spinal  Level of Consciousness: awake and alert   Airway and Oxygen Therapy: Patient Spontanous Breathing  Post-op Pain: mild  Post-op Assessment: Post-op Vital signs reviewed, Patient's Cardiovascular Status Stable, Respiratory Function Stable, Patent Airway and No signs of Nausea or vomiting  Last Vitals:  Filed Vitals:   02/25/13 1215  BP: 99/51  Pulse: 72  Temp:   Resp: 16    Post-op Vital Signs: stable   Complications: No apparent anesthesia complications

## 2013-02-25 NOTE — Progress Notes (Signed)
ANTICOAGULATION CONSULT NOTE - Initial Consult  Pharmacy Consult for Warfarin Indication: VTE prophylaxis  Allergies  Allergen Reactions  . Penicillins Rash    Patient Measurements: Height: 5\' 8"  (172.7 cm) Weight: 172 lb 2 oz (78.075 kg) IBW/kg (Calculated) : 63.9   Vital Signs: Temp: 97.5 F (36.4 C) (05/09 1344) Temp src: Oral (05/09 1344) BP: 119/66 mmHg (05/09 1344) Pulse Rate: 70 (05/09 1344)  Labs:  Recent Labs  02/25/13 0800  LABPROT 14.5  INR 1.15    Estimated Creatinine Clearance: 43.8 ml/min (by C-G formula based on Cr of 1.07).   Medical History: Past Medical History  Diagnosis Date  . Colonic polyp   . Hyperlipemia   . Peripheral neuropathy   . Personal history of PE (pulmonary embolism) 2011  . Numbness in feet   . 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 Arthritis   . Difficulty sleeping     Medications:  Scheduled:  . clindamycin (CLEOCIN) IV  600 mg Intravenous Q6H  . [COMPLETED] clindamycin (CLEOCIN) IV  900 mg Intravenous On Call to OR  . docusate sodium  100 mg Oral BID  . ferrous sulfate  325 mg Oral TID PC  . metoprolol succinate  25 mg Oral q morning - 10a  . multivitamin with minerals  1 tablet Oral Daily  . OxyCODONE  10 mg Oral Q12H   Infusions:  . sodium chloride 75 mL/hr at 02/25/13 1237  . [DISCONTINUED] lactated ringers     PRN: acetaminophen, acetaminophen, alum & mag hydroxide-simeth, bisacodyl, diphenhydrAMINE, HYDROmorphone (DILAUDID) injection, menthol-cetylpyridinium, methocarbamol (ROBAXIN) IV, methocarbamol, metoCLOPramide (REGLAN) injection, metoCLOPramide, ondansetron (ZOFRAN) IV, ondansetron, oxyCODONE, phenol, polyethylene glycol, sodium phosphate, zolpidem, [DISCONTINUED] 0.9 % irrigation (POUR BTL), [DISCONTINUED] fentaNYL [DISCONTINUED] meperidine (DEMEROL) injection, [DISCONTINUED] promethazine, [DISCONTINUED] sodium chloride irrigation  Assessment:  79 yo F with a history of PE in 2011 on chronic  anticoagulation with warfarin.  Patient is now s/p THA on 5/9 to resume warfarin.  Warfarin has been on hold since 5/4 in anticipation of surgery today.  Warfarin dose prior to admission = 5.5 mg daily   INR is at baseline (1.15)   Pre-op CBC WNL  Discussed prophylactic lovenox use until INR at goal with Dr. Magnus Ivan - Decision made to not cover with lovenox at this point but to let INR trend back to goal give that hx of PE is remote and not clearly defined.    Goal of Therapy:  INR 2-3 Monitor platelets by anticoagulation protocol: Yes   Plan:  1.) Warfarin 7.5 mg po x 1 tonight at 1800  2.) Daily PT/INR  Linnette Panella, Loma Messing PharmD Pager #: [phone] 2:16 PM 02/25/2013

## 2013-02-25 NOTE — Transfer of Care (Signed)
Immediate Anesthesia Transfer of Care Note  Patient: [name]  Procedure(s) Performed: Procedure(s): RIGHT TOTAL HIP ARTHROPLASTY ANTERIOR APPROACH (Right)  Patient Location: PACU  Anesthesia Type:Regional and Spinal  Level of Consciousness: awake, alert , oriented and patient cooperative  Airway & Oxygen Therapy: Patient Spontanous Breathing and Patient connected to face mask oxygen  Post-op Assessment: Report given to PACU RN and Post -op Vital signs reviewed and stable  Post vital signs: Reviewed and stable  Complications: No apparent anesthesia complications

## 2013-02-25 NOTE — Anesthesia Procedure Notes (Signed)
Spinal  Patient location during procedure: OR Staffing Anesthesiologist: Jacorion Klem Performed by: anesthesiologist  Preanesthetic Checklist Completed: patient identified, site marked, surgical consent, pre-op evaluation, timeout performed, IV checked, risks and benefits discussed and monitors and equipment checked Spinal Block Patient position: sitting Prep: Betadine Patient monitoring: heart rate, continuous pulse ox and blood pressure Approach: right paramedian Location: L2-3 Injection technique: single-shot Needle Needle type: Spinocan  Needle gauge: 22 G Needle length: 9 cm Additional Notes Expiration date of kit checked and confirmed. Patient tolerated procedure well, without complications.     

## 2013-02-25 NOTE — H&P (Signed)
TOTAL HIP ADMISSION H&P  Patient is admitted for right total hip arthroplasty.  Subjective:  Chief Complaint: right hip pain  HPI: [name], 79 y.o. female, has a history of pain and functional disability in the right hip(s) due to arthritis and patient has failed non-surgical conservative treatments for greater than 12 weeks to include NSAID's and/or analgesics, use of assistive devices, weight reduction as appropriate and activity modification.  Onset of symptoms was gradual starting 5 years ago with gradually worsening course since that time.The patient noted no past surgery on the right hip(s).  Patient currently rates pain in the right hip at 10 out of 10 with activity. Patient has night pain, worsening of pain with activity and weight bearing, trendelenberg gait, pain that interfers with activities of daily living, pain with passive range of motion and crepitus. Patient has evidence of subchondral cysts, subchondral sclerosis, periarticular osteophytes and joint space narrowing by imaging studies. This condition presents safety issues increasing the risk of falls.  There is no current active infection.  Patient Active Problem List   Diagnosis Date Noted  . Degenerative arthritis of hip 02/25/2013  . Peripheral neuropathy 01/05/2013  . Hip pain 01/05/2013   Past Medical History  Diagnosis Date  . Colonic polyp   . Hyperlipemia   . Peripheral neuropathy   . ICD (implantable cardiac defibrillator) in place     Had> heart rate >given metoprolol  . Personal history of PE (pulmonary embolism) 2011  . Numbness in feet   . 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 Arthritis   . Difficulty sleeping     Past Surgical History  Procedure Laterality Date  . Nephrectomy  1993    RT -   . Cesarean section    . Appendectomy    . Abdominal hysterectomy      No prescriptions prior to admission   Allergies  Allergen Reactions  . Penicillins Rash    History  Substance Use  Topics  . Smoking status: Former Smoker    Quit date: 02/18/1983  . Smokeless tobacco: Not on file  . Alcohol Use: Yes     Comment: rare    Family History  Problem Relation Age of Onset  . Hypertension Father   . Diabetes Father   . Hypertension Mother   . Colon cancer Sister      Review of Systems  Musculoskeletal: Positive for joint pain.  All other systems reviewed and are negative.    Objective:  Physical Exam  Constitutional: She is oriented to person, place, and time. She appears well-developed and well-nourished.  HENT:  Head: Normocephalic and atraumatic.  Eyes: EOM are normal. Pupils are equal, round, and reactive to light.  Neck: Normal range of motion. Neck supple.  Cardiovascular: Normal rate and regular rhythm.   Respiratory: Effort normal and breath sounds normal.  GI: Soft. Bowel sounds are normal.  Musculoskeletal:       Right hip: She exhibits decreased range of motion, decreased strength, bony tenderness and crepitus.  Neurological: She is alert and oriented to person, place, and time.  Skin: Skin is warm and dry.  Psychiatric: She has a normal mood and affect.    Vital signs in last 24 hours:    Labs:   Estimated body mass index is 42.75 kg/(m^2) as calculated from the following:   Height as of 01/05/13: 5\' 7"  (1.702 m).   Weight as of 01/05/13: 123.832 kg (273 lb).  Imaging Review Plain radiographs demonstrate severe degenerative joint disease of the right hip(s). The bone quality appears to be good for age and reported activity level.  Assessment/Plan:  End stage arthritis, right hip(s)  The patient history, physical examination, clinical judgement of the provider and imaging studies are consistent with end stage degenerative joint disease of the right hip(s) and total hip arthroplasty is deemed medically necessary. The treatment options including medical management, injection therapy, arthroscopy and arthroplasty were discussed at length.  The risks and benefits of total hip arthroplasty were presented and reviewed. The risks due to aseptic loosening, infection, stiffness, dislocation/subluxation,  thromboembolic complications and other imponderables were discussed.  The patient acknowledged the explanation, agreed to proceed with the plan and consent was signed. Patient is being admitted for inpatient treatment for surgery, pain control, PT, OT, prophylactic antibiotics, VTE prophylaxis, progressive ambulation and ADL's and discharge planning.The patient is planning to be discharged to skilled nursing facility

## 2013-02-25 NOTE — Progress Notes (Signed)
Utilization review completed.  

## 2013-02-25 NOTE — Brief Op Note (Signed)
02/25/2013  11:28 AM  PATIENT:  [name]  79 y.o. female  PRE-OPERATIVE DIAGNOSIS:  Right hip end-stage arthritis  POST-OPERATIVE DIAGNOSIS:  Right hip end-stage arthritis  PROCEDURE:  Procedure(s): RIGHT TOTAL HIP ARTHROPLASTY ANTERIOR APPROACH (Right)  SURGEON:  Surgeon(s) and Role:    * Kathryne Hitch, MD - Primary  PHYSICIAN ASSISTANT: Rexene Edison, PA-C  ANESTHESIA:   spinal  EBL:  Total I/O In: 2000 [I.V.:2000] Out: 375 [Urine:125; Blood:250]  BLOOD ADMINISTERED:none  DRAINS: none   LOCAL MEDICATIONS USED:  NONE  SPECIMEN:  No Specimen  DISPOSITION OF SPECIMEN:  N/A  COUNTS:  YES  TOURNIQUET:  * No tourniquets in log *  DICTATION: .Other Dictation: Dictation Number [id number]  PLAN OF CARE: Admit to inpatient   PATIENT DISPOSITION:  PACU - hemodynamically stable.   Delay start of Pharmacological VTE agent (>24hrs) due to surgical blood loss or risk of bleeding: no

## 2013-02-25 NOTE — Anesthesia Preprocedure Evaluation (Addendum)
Anesthesia Evaluation  Patient identified by MRN, date of birth, ID band Patient awake    Reviewed: Allergy & Precautions, H&P , NPO status , Patient's Chart, lab work & pertinent test results  Airway Mallampati: II TM Distance: >3 FB Neck ROM: Full    Dental no notable dental hx. (+) Partial Upper   Pulmonary neg pulmonary ROS, PE breath sounds clear to auscultation  Pulmonary exam normal       Cardiovascular negative cardio ROS  - Cardiac Defibrillator Rhythm:Regular Rate:Normal     Neuro/Psych negative neurological ROS  negative psych ROS   GI/Hepatic negative GI ROS, Neg liver ROS,   Endo/Other  negative endocrine ROS  Renal/GU negative Renal ROS  negative genitourinary   Musculoskeletal negative musculoskeletal ROS (+)   Abdominal   Peds negative pediatric ROS (+)  Hematology negative hematology ROS (+)   Anesthesia Other Findings   Reproductive/Obstetrics negative OB ROS                          Anesthesia Physical Anesthesia Plan  ASA: II  Anesthesia Plan: Spinal   Post-op Pain Management:    Induction: Intravenous  Airway Management Planned: Simple Face Mask  Additional Equipment:   Intra-op Plan:   Post-operative Plan: Extubation in OR  Informed Consent: I have reviewed the patients History and Physical, chart, labs and discussed the procedure including the risks, benefits and alternatives for the proposed anesthesia with the patient or authorized representative who has indicated his/her understanding and acceptance.   Dental advisory given  Plan Discussed with: CRNA  Anesthesia Plan Comments: (Pt does not have and has never had an AICD!)       Anesthesia Quick Evaluation

## 2013-02-26 LAB — BASIC METABOLIC PANEL
Calcium: 8.4 mg/dL (ref 8.4–10.5)
GFR calc Af Amer: 69 mL/min — ABNORMAL LOW (ref >90)
GFR calc non Af Amer: 59 mL/min — ABNORMAL LOW (ref >90)
Glucose, Bld: 140 mg/dL — ABNORMAL HIGH (ref 70–99)
Potassium: 4.1 mEq/L (ref 3.5–5.1)
Sodium: 137 mEq/L (ref 135–145)

## 2013-02-26 LAB — CBC
Hemoglobin: 11.5 g/dL — ABNORMAL LOW (ref 12.0–15.0)
MCH: 29.9 pg (ref 26.0–34.0)
MCHC: 34.1 g/dL (ref 30.0–36.0)
Platelets: 178 10*3/uL (ref 150–400)
RDW: 14.2 % (ref 11.5–15.5)

## 2013-02-26 LAB — PROTIME-INR: INR: 1.15 (ref 0.00–1.49)

## 2013-02-26 MED ORDER — ENOXAPARIN SODIUM 40 MG/0.4ML ~~LOC~~ SOLN
40.0000 mg | Freq: Two times a day (BID) | SUBCUTANEOUS | Status: DC
  Administered 2013-02-26 – 2013-02-27 (×3): 40 mg via SUBCUTANEOUS
  Filled 2013-02-26 (×6): qty 0.4

## 2013-02-26 MED ORDER — ENOXAPARIN SODIUM 80 MG/0.8ML ~~LOC~~ SOLN: 1.0000 mg/kg | SUBCUTANEOUS | Status: DC

## 2013-02-26 MED ORDER — WARFARIN SODIUM 7.5 MG PO TABS
7.5000 mg | ORAL_TABLET | Freq: Once | ORAL | Status: AC
  Administered 2013-02-26: 7.5 mg via ORAL
  Filled 2013-02-26: qty 1

## 2013-02-26 NOTE — Progress Notes (Signed)
Subjective: 1 Day Post-Op Procedure(s) (LRB): RIGHT TOTAL HIP ARTHROPLASTY ANTERIOR APPROACH (Right) Patient reports pain as mild.    Objective: Vital signs in last 24 hours: Temp:  [94 F (34.4 C)-98.4 F (36.9 C)] 98.4 F (36.9 C) (05/10 0533) Pulse Rate:  [68-90] 75 (05/10 0533) Resp:  [14-18] 16 (05/10 0533) BP: (94-128)/(51-78) 94/55 mmHg (05/10 0533) SpO2:  [99 %-100 %] 100 % (05/10 0533) Weight:  [78.075 kg (172 lb 2 oz)] 78.075 kg (172 lb 2 oz) (05/09 1344)  Intake/Output from previous day: 05/09 0701 - 05/10 0700 In: 4723.8 [P.O.:480; I.V.:4193.8; IV Piggyback:50] Out: 1625 [Urine:1375; Blood:250] Intake/Output this shift: Total I/O In: 720 [P.O.:120; I.V.:600] Out: 1150 [Urine:1150]  No results found for this basename: HGB,  in the last 72 hours No results found for this basename: WBC, RBC, HCT, PLT,  in the last 72 hours No results found for this basename: NA, K, CL, CO2, BUN, CREATININE, GLUCOSE, CALCIUM,  in the last 72 hours  Recent Labs  02/25/13 0800  INR 1.15    Neurologically intact dressing dry.  Hgb pending  Assessment/Plan: 1 Day Post-Op Procedure(s) (LRB): RIGHT TOTAL HIP ARTHROPLASTY ANTERIOR APPROACH (Right) Up with therapy SL IV .   YATES,MARK C 02/26/2013, 6:14 AM

## 2013-02-26 NOTE — Progress Notes (Signed)
ANTICOAGULATION CONSULT NOTE - Follow Up Consult  Pharmacy Consult for Warfarin Indication: VTE prophylaxis and history of PE  Allergies  Allergen Reactions  . Penicillins Rash    Patient Measurements: Height: 5\' 8"  (172.7 cm) Weight: 172 lb 2 oz (78.075 kg) IBW/kg (Calculated) : 63.9  Vital Signs: Temp: 98.4 F (36.9 C) (05/10 0533) Temp src: Oral (05/10 0533) BP: 94/55 mmHg (05/10 0533) Pulse Rate: 75 (05/10 0533)  Labs:  Recent Labs  02/25/13 0800 02/26/13 0553  HGB  --  11.5*  HCT  --  33.7*  PLT  --  178  LABPROT 14.5 14.5  INR 1.15 1.15  CREATININE  --  0.88    Estimated Creatinine Clearance: 53.2 ml/min (by C-G formula based on Cr of 0.88).   Medications:  Scheduled:  . [COMPLETED] clindamycin (CLEOCIN) IV  600 mg Intravenous Q6H  . [COMPLETED] clindamycin (CLEOCIN) IV  900 mg Intravenous On Call to OR  . docusate sodium  100 mg Oral BID  . enoxaparin (LOVENOX) injection  1 mg/kg Subcutaneous Q24H  . ferrous sulfate  325 mg Oral TID PC  . metoprolol succinate  25 mg Oral q morning - 10a  . multivitamin with minerals  1 tablet Oral Daily  . OxyCODONE  10 mg Oral Q12H  . [COMPLETED] warfarin  7.5 mg Oral ONCE-1800  . Warfarin - Pharmacist Dosing Inpatient   Does not apply q1800   Infusions:  . sodium chloride 75 mL/hr at 02/25/13 1237  . [DISCONTINUED] lactated ringers      Assessment: 79 yo F with a history of PE in 2011 on chronic anticoagulation with warfarin. Patient is now s/p THA on 5/9 and warfarin resumed postop.  Warfarin dose PTA: 5.5mg  daily, stopped 5/4 in anticipation of surgery  Given boosted dose of warfarin 7.5mg  last night  INR unchanged today as expected (1.15)  CBC decreased as expected postop, no bleeding/complications reported.  MD has ordered Lovenox 40mg  sq q12 as bridge until INR therapeutic - this is somewhere between treatment and prophylactic dose. Verified with Dr. Ophelia Charter that this is the intention due to concerns for  postop bleeding.   Goal of Therapy:  INR 2-3 Monitor platelets by anticoagulation protocol: Yes   Plan:   Repeat warfarin 7.5mg  today  Daily PT/INR  Lovenox per MD  Loralee Pacas, PharmD, BCPS Pager: [phone] 02/26/2013,9:44 AM

## 2013-02-26 NOTE — Evaluation (Signed)
Physical Therapy Evaluation Patient Details Name: AARIAN CLEAVER MRN: [id number] DOB: Feb 16, 1928 Today's Date: 02/26/2013 Time: 7829-5621 PT Time Calculation (min): 28 min  PT Assessment / Plan / Recommendation Clinical Impression  Pt s/p R THR presents with decreased R LE strength/ROM and post op pain and nausea limiting functional mobility    PT Assessment  Patient needs continued PT services    Follow Up Recommendations  SNF    Does the patient have the potential to tolerate intense rehabilitation      Barriers to Discharge None      Equipment Recommendations  Rolling walker with 5" wheels    Recommendations for Other Services OT consult   Frequency 7X/week    Precautions / Restrictions Precautions Precautions: Fall Restrictions Weight Bearing Restrictions: No Other Position/Activity Restrictions: WBAT   Pertinent Vitals/Pain 1/10; pt premedicated, ice packs provided      Mobility  Bed Mobility Bed Mobility: Supine to Sit Supine to Sit: 1: +2 Total assist Supine to Sit: Patient Percentage: 60% Details for Bed Mobility Assistance: cues for sequence and use of L LE to self assist Transfers Transfers: Sit to Stand;Stand to Sit Sit to Stand: 1: +2 Total assist;From bed;With upper extremity assist;From elevated surface Sit to Stand: Patient Percentage: 60% Stand to Sit: 1: +2 Total assist;To chair/3-in-1;With armrests;With upper extremity assist Stand to Sit: Patient Percentage: 60% Details for Transfer Assistance: cues for LE management and use of UEs to self assist Ambulation/Gait Ambulation/Gait Assistance: 1: +2 Total assist Ambulation/Gait: Patient Percentage: 70% Ambulation Distance (Feet): 8 Feet Assistive device: Rolling walker Ambulation/Gait Assistance Details: cues for sequence, posture, position from RW, and stride length Gait Pattern: Step-to pattern;Decreased step length - left;Decreased stance time - right General Gait Details: Ltd by onset of  nausea - RN aware Stairs: No    Exercises Total Joint Exercises Ankle Circles/Pumps: AROM;15 reps;Supine;Both Quad Sets: AROM;Both;10 reps;Supine Heel Slides: AAROM;15 reps;Supine;Right Hip ABduction/ADduction: AAROM;Right;10 reps;Supine   PT Diagnosis: Difficulty walking  PT Problem List: Decreased strength;Pain;Decreased range of motion;Decreased activity tolerance;Decreased mobility;Decreased balance;Decreased knowledge of use of DME PT Treatment Interventions: DME instruction;Gait training;Functional mobility training;Therapeutic activities;Therapeutic exercise;Patient/family education   PT Goals Acute Rehab PT Goals PT Goal Formulation: With patient Time For Goal Achievement: 03/10/13 Potential to Achieve Goals: Good Pt will go Supine/Side to Sit: with supervision PT Goal: Supine/Side to Sit - Progress: Goal set today Pt will go Sit to Supine/Side: with supervision PT Goal: Sit to Supine/Side - Progress: Goal set today Pt will go Sit to Stand: with supervision PT Goal: Sit to Stand - Progress: Goal set today Pt will go Stand to Sit: with supervision PT Goal: Stand to Sit - Progress: Goal set today Pt will Ambulate: 51 - 150 feet;with supervision;with rolling walker PT Goal: Ambulate - Progress: Goal set today  Visit Information  Last PT Received On: 02/26/13 Assistance Needed: +2    Subjective Data  Subjective: I;m ready to get up and move Patient Stated Goal: Rehab and resume previous lifestyle with decreased pain   Prior Functioning  Home Living Lives With: Alone Home Adaptive Equipment: Straight cane Prior Function Level of Independence: Independent with assistive device(s) Able to Take Stairs?: Yes Driving: Yes Vocation: Retired Musician: No difficulties Dominant Hand: Right    Cognition  Cognition Arousal/Alertness: Awake/alert Behavior During Therapy: WFL for tasks assessed/performed Overall Cognitive Status: Within Functional Limits  for tasks assessed    Extremity/Trunk Assessment Right Upper Extremity Assessment RUE ROM/Strength/Tone: [facility] for tasks assessed Left Upper Extremity  Assessment LUE ROM/Strength/Tone: WFL for tasks assessed Right Lower Extremity Assessment RLE ROM/Strength/Tone: Deficits RLE ROM/Strength/Tone Deficits: Hip strength 2/5 with AAROM at hip to 90 flex and 15 abd Left Lower Extremity Assessment LLE ROM/Strength/Tone: WFL for tasks assessed Trunk Assessment Trunk Assessment: Normal   Balance    End of Session PT - End of Session Equipment Utilized During Treatment: Gait belt Activity Tolerance: Patient tolerated treatment well;Patient limited by fatigue;Other (comment) (nausea) Patient left: in chair;with call bell/phone within reach Nurse Communication: Mobility status  GP     Denarius Sesler 02/26/2013, 11:55 AM

## 2013-02-26 NOTE — Op Note (Signed)
[name], [name] NO.:  [id number]  MEDICAL RECORD NO.:  [id number]  LOCATION:  1617                         FACILITY:  [facility]  PHYSICIAN:  Vanita Panda. Magnus Ivan, M.D.DATE OF BIRTH:  November 08, 1927  DATE OF PROCEDURE:  02/25/2013 DATE OF DISCHARGE:                              OPERATIVE REPORT   PREOPERATIVE DIAGNOSES:  Severe end-stage arthritis and degenerative joint disease, right hip.  POSTOPERATIVE DIAGNOSES:  Severe end-stage arthritis and degenerative joint disease, right hip.  PROCEDURE:  Right total hip arthroplasty through direct anterior approach.  IMPLANTS:  DePuy Sector Gription acetabular component, size 50, size 32+ 4 neutral polyethylene liner, size 12 Corail femoral component with standard offset, size 32+ 1 metal hip ball.  SURGEON:  Vanita Panda. Magnus Ivan, M.D.  ASSISTANT:  Richardean Canal, P.A.  ANESTHESIA:  Spinal.  ANTIBIOTICS:  900 mg IV clindamycin.  BLOOD LOSS:  Less than 300 mL.  COMPLICATIONS:  None.  INDICATIONS:  Ms. Sherpa is a very pleasant 79 year old female with severe debilitating end-stage arthritis of her right hip.  This has hurt her for many years now and she said she is almost wheelchair bound because her pain has significantly increased over the last year.  Her mobility is severely limited and her quality of life.  She says it is bit more.  She has gotten the point where she wished to proceed with a total hip arthroplasty due to the facts on her activities of daily living and her quality of life.  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  She has been on chronic Coumadin therapy. So, this week we have bridged her with Lovenox.  Although, her primary care physician is dissuaded her from having the surgery, her renal physician said that he felt this would be appropriate to her.  I talked to her in great detail about this being elective surgery and she does wish to proceed with this  due to the poor impact on her quality of life. I believe with anterior hip surgery and increased mobility right away that we can get her moving and now on to a better quality of life that she understands this too.  The goals are decreased pain and increased mobility.  PROCEDURE DESCRIPTION:  After informed consent was obtained and appropriate right hip was marked, she was brought to the operating room and spinal anesthesia was obtained while she was on her stretcher.  She was laid back on the stretcher in supine position, a Foley catheter was placed and then traction boots were placed on both of her feet.  Next, she was placed supine on the Hana fracture table with the perineal post in place and both legs in inline skeletal traction with no traction applied.  The right operative hip was prepped and draped with DuraPrep and sterile drapes.  Time-out was called to identify correct patient and correct right hip.  We then made an incision just inferior and posterior to the anterior-superior iliac spine and carried this obliquely down the leg.  I dissected down to the tensor fascia lata.  The tensor fascia was divided longitudinally.  We then proceeded with a direct anterior  approach to the hip.  A Cobra retractor was placed around the lateral neck and up underneath the rectus femoris around the medial neck.  I cauterized the lateral femoral circumflex vessels and then I opened up the hip capsule in a L-type format placing the Cobra retractors within the hip capsule tear without significant disease of her hip.  I then made an femoral neck cut with an oscillating saw just proximal to the lesser trochanter and completed this with an osteotome.  We used a Corkscrew guide to remove the femoral head in its entirety and found it to be completely devoid of cartilage.  I placed the Bent Hohmann medially and a Cobra retractor laterally within the acetabulum and debris and remnants of the labrum and  then began reaming with size 42 and 2-mm increments all the way up to size 50.  With all reamers were placed under direct visualization and the last reamer was also placed under direct fluoroscopy showed standard depth of reaming, inclination and anteversion.  Once I was pleased with this, I placed the real DePuy Sector Gription acetabular component size 50, the apex hole eliminator guide and a 32+ 4 neutral polyethylene liner.  Attention was then turned to the femur with the leg externally rotated to about 95 degrees, extended and adducted.  We placed a hook underneath the femur temporarily, a Mueller retractor medially and a bent Hohmann behind the greater trochanter.  I released the lateral hip capsule and then used a rongeur and a box cutting guide to open the femoral canal and to lateralize.  I then began broaching from a size 8 broach up to a size 12.  The 12 was felt to be stable.  So, I trialed a 32+ 1 metal hip ball and brought the leg back over and up, and with traction and internal rotation reducing the pelvis and it was stable with internal and external rotation.  Minimal Shuck and leg lengths were measured equal under direct fluoroscopy.  We then dislocated the hip and removed the trial components.  I placed the real Corail component size 12 standard offset and the real 32+ 1 metal hip ball.  We reduced this back to the acetabulum and again it was stable.  We copiously irrigated the soft tissue and the joint with normal saline solution using pulsatile lavage. We closed the joint capsule with interrupted #1 Ethibond suture followed by running #1 Vicryl in the tensor fascia, 0 Vicryl deep, 2-0 Vicryl in subcutaneous tissue, 4-0 Monocryl subcuticular stitch and Dermabond on the skin as well as an Aquacel dressing.  She was then taken off of the Hana table and to the recovery room in stable condition.  All final counts were correct and there were no complications noted.  Of  note, Richardean Canal, PA-C was present for the entire case and his presence was crucial for patient positioning, assistance with exposure of the hip joint, retracting of tissues and then closure of the wound.     Vanita Panda. Magnus Ivan, M.D.     CYB/MEDQ  D:  02/25/2013  T:  02/26/2013  Job:  [id number]

## 2013-02-26 NOTE — Progress Notes (Signed)
Physical Therapy Treatment Patient Details Name: [name] MRN: [id number] DOB: [birth year] Today's Date: 02/26/2013 Time: 4098-1191 PT Time Calculation (min): 28 min  PT Assessment / Plan / Recommendation Comments on Treatment Session  Pt at sink to brush teeth with min assist to maintain balance    Follow Up Recommendations  SNF     Does the patient have the potential to tolerate intense rehabilitation     Barriers to Discharge        Equipment Recommendations  Rolling walker with 5" wheels    Recommendations for Other Services OT consult  Frequency 7X/week   Plan Discharge plan remains appropriate    Precautions / Restrictions Precautions Precautions: Fall Restrictions Weight Bearing Restrictions: No Other Position/Activity Restrictions: WBAT   Pertinent Vitals/Pain     Mobility  Bed Mobility Bed Mobility: Sit to Supine Sit to Supine: 3: Mod assist Details for Bed Mobility Assistance: cues for technique Transfers Transfers: Sit to Stand;Stand to Sit Sit to Stand: 1: +2 Total assist;From bed;With upper extremity assist;From elevated surface Sit to Stand: Patient Percentage: 60% Stand to Sit: 1: +2 Total assist;With upper extremity assist;To bed Stand to Sit: Patient Percentage: 60% Details for Transfer Assistance: cues for LE and UE placement and sequence Ambulation/Gait Ambulation/Gait Assistance: 1: +2 Total assist Ambulation/Gait: Patient Percentage: 70% Ambulation Distance (Feet): 30 Feet (and 5) Assistive device: Rolling walker Ambulation/Gait Assistance Details: cues for sequence, posture, position from RW, stride length  Gait Pattern: Step-to pattern;Decreased step length - left;Decreased stance time - right Stairs: No    Exercises     PT Diagnosis:    PT Problem List:   PT Treatment Interventions:     PT Goals Acute Rehab PT Goals PT Goal Formulation: With patient Time For Goal Achievement: 03/10/13 Potential to Achieve Goals: Good Pt  will go Supine/Side to Sit: with supervision PT Goal: Supine/Side to Sit - Progress: Goal set today Pt will go Sit to Supine/Side: with supervision PT Goal: Sit to Supine/Side - Progress: Goal set today Pt will go Sit to Stand: with supervision PT Goal: Sit to Stand - Progress: Goal set today Pt will go Stand to Sit: with supervision PT Goal: Stand to Sit - Progress: Goal set today Pt will Ambulate: 51 - 150 feet;with supervision;with rolling walker PT Goal: Ambulate - Progress: Goal set today  Visit Information  Last PT Received On: 02/26/13 Assistance Needed: +2    Subjective Data  Subjective: I think I'm rooting to the chair Patient Stated Goal: Rehab and resume previous lifestyle with decreased pain   Cognition  Cognition Arousal/Alertness: Awake/alert Behavior During Therapy: WFL for tasks assessed/performed Overall Cognitive Status: Within Functional Limits for tasks assessed    Balance     End of Session PT - End of Session Equipment Utilized During Treatment: Gait belt Activity Tolerance: Patient tolerated treatment well Patient left: in bed;with call bell/phone within reach Nurse Communication: Mobility status   GP     [name] 02/26/2013, 4:48 PM

## 2013-02-26 NOTE — Evaluation (Signed)
Occupational Therapy Evaluation Patient Details Name: [name] MRN: [id number] DOB: May 07, 1928 Today's Date: 02/26/2013 Time: 4098-1191 OT Time Calculation (min): 31 min  OT Assessment / Plan / Recommendation Clinical Impression  This 79 year old female was admitted for R anterior direct THA.  She will benefit from continued OT to increase independence with adls through further education on AE, strengthening, activity tolerance and balance interventions.    OT Assessment  Patient needs continued OT Services    Follow Up Recommendations  SNF    Barriers to Discharge      Equipment Recommendations  3 in 1 bedside comode    Recommendations for Other Services    Frequency  Min 2X/week    Precautions / Restrictions Precautions Precautions: Fall Restrictions Other Position/Activity Restrictions: WBAT   Pertinent Vitals/Pain R hip sore; repositioned and ice reapplied    ADL  Grooming: Minimal assistance;Teeth care Where Assessed - Grooming: Supported standing Upper Body Bathing: Set up Where Assessed - Upper Body Bathing: Unsupported sitting Lower Body Bathing: +2 Total assistance Lower Body Bathing: Patient Percentage: 60% Where Assessed - Lower Body Bathing: Supported sit to stand (with AE) Upper Body Dressing: Set up Where Assessed - Upper Body Dressing: Unsupported sitting Lower Body Dressing: +2 Total assistance (with AE) Lower Body Dressing: Patient Percentage: 40% Where Assessed - Lower Body Dressing: Supported sit to stand Toilet Transfer: Simulated;+2 Total assistance Toilet Transfer: Patient Percentage: 60% Statistician Method: Sit to stand Toileting - Architect and Hygiene: +2 Total assistance Toileting - Architect and Hygiene: Patient Percentage: 70% Where Assessed - Toileting Clothing Manipulation and Hygiene: Sit to stand from 3-in-1 or toilet Equipment Used: Rolling walker;Sock aid;Reacher Transfers/Ambulation Related to  ADLs: ambulated to sink then hall before returning to bed.  Pt lost balance backwards several times requiring assist to recover ADL Comments: Educated on AE.  Pt used sock aid with mod A.  Needs further practice    OT Diagnosis: Generalized weakness  OT Problem List: Decreased strength;Decreased range of motion;Impaired balance (sitting and/or standing);Decreased knowledge of use of DME or AE OT Treatment Interventions: Self-care/ADL training;DME and/or AE instruction;Therapeutic activities;Patient/family education;Balance training   OT Goals Acute Rehab OT Goals OT Goal Formulation: With patient Time For Goal Achievement: 03/12/13 Potential to Achieve Goals: Good ADL Goals Pt Will Perform Lower Body Bathing: with min assist;Sit to stand from chair;with adaptive equipment ADL Goal: Lower Body Bathing - Progress: Goal set today Pt Will Perform Lower Body Dressing: with min assist;Sit to stand from chair;with adaptive equipment ADL Goal: Lower Body Dressing - Progress: Goal set today Pt Will Transfer to Toilet: with min assist;Ambulation;3-in-1 ADL Goal: Toilet Transfer - Progress: Goal set today Pt Will Perform Toileting - Hygiene: with min assist;Sit to stand from 3-in-1/toilet ADL Goal: Toileting - Hygiene - Progress: Goal set today  Visit Information  Last OT Received On: 02/26/13 Assistance Needed: +2 PT/OT Co-Evaluation/Treatment: Yes    Subjective Data  Subjective: Yes, I'd like to brush my teeth Patient Stated Goal: rehab at [facility] then home   Prior Functioning     Home Living Lives With: Alone Available Help at Discharge: Skilled Nursing Facility Home Adaptive Equipment: Straight cane Prior Function Level of Independence: Independent with assistive device(s) Able to Take Stairs?: Yes Driving: Yes Vocation: Retired Musician: No difficulties Dominant Hand: Right         Vision/Perception     Cognition  Cognition Arousal/Alertness:  Awake/alert Behavior During Therapy: WFL for tasks assessed/performed Overall Cognitive Status:  Within Functional Limits for tasks assessed    Extremity/Trunk Assessment Right Upper Extremity Assessment RUE ROM/Strength/Tone: [facility] for tasks assessed Left Upper Extremity Assessment LUE ROM/Strength/Tone: WFL for tasks assessed     Mobility Bed Mobility Sit to Supine: 3: Mod assist Details for Bed Mobility Assistance: cues for technique Transfers Sit to Stand: 1: +2 Total assist;From bed;With upper extremity assist;From elevated surface Sit to Stand: Patient Percentage: 60% Details for Transfer Assistance: cues for LE and UE placement and sequence     Exercise     Balance     End of Session OT - End of Session Activity Tolerance: Patient tolerated treatment well Patient left: in bed;with call bell/phone within reach  GO     [name] 02/26/2013, 2:45 PM Marica Otter, OTR/L [phone] 02/26/2013

## 2013-02-27 LAB — PROTIME-INR: INR: 1.62 — ABNORMAL HIGH (ref 0.00–1.49)

## 2013-02-27 LAB — CBC
HCT: 32.6 % — ABNORMAL LOW (ref 36.0–46.0)
MCHC: 34 g/dL (ref 30.0–36.0)
Platelets: 180 10*3/uL (ref 150–400)
RDW: 14.3 % (ref 11.5–15.5)
WBC: 10.8 10*3/uL — ABNORMAL HIGH (ref 4.0–10.5)

## 2013-02-27 MED ORDER — METHOCARBAMOL 500 MG PO TABS: 500.0000 mg | ORAL_TABLET | Freq: Four times a day (QID) | ORAL | Status: DC | PRN

## 2013-02-27 MED ORDER — WARFARIN SODIUM 5 MG PO TABS
5.5000 mg | ORAL_TABLET | Freq: Once | ORAL | Status: AC
  Administered 2013-02-27: 5.5 mg via ORAL
  Filled 2013-02-27: qty 1

## 2013-02-27 MED ORDER — OXYCODONE-ACETAMINOPHEN 5-325 MG PO TABS: 1.0000 | ORAL_TABLET | ORAL | Status: DC | PRN

## 2013-02-27 MED ORDER — ENOXAPARIN SODIUM 40 MG/0.4ML ~~LOC~~ SOLN: 40.0000 mg | Freq: Two times a day (BID) | SUBCUTANEOUS | Status: DC

## 2013-02-27 NOTE — Progress Notes (Signed)
Clinical Social Work Department BRIEF PSYCHOSOCIAL ASSESSMENT 02/27/2013  Patient:  [name], [name]     Account Number:  [id number]     Admit date:  02/25/2013  Clinical Social Worker:  Doroteo Glassman  Date/Time:  02/27/2013 12:34 PM  Referred by:  Physician  Date Referred:  02/27/2013 Referred for  SNF Placement   Other Referral:   Interview type:  Patient Other interview type:   Pt's son, Jeannett Senior    PSYCHOSOCIAL DATA Living Status:  ALONE Admitted from facility:   Level of care:   Primary support name:  Jeannett Senior Primary support relationship to patient:  CHILD, ADULT Degree of support available:   strong    CURRENT CONCERNS Current Concerns  Post-Acute Placement   Other Concerns:    SOCIAL WORK ASSESSMENT / PLAN Met with Pt, son and other visitors at bedside.    Pt is amenable to SNF and would like to go to [facility]. She indicated that she has contacted [facility] and that they are aware of her admission there.  Pt's son is available to sign Pt into [facility] tomorrow between 10 and 11.    CSW to pass this information on to weekday CSW, Asher Muir.    CSW thanked Pt and her family/visitors for their time.   Assessment/plan status:   Other assessment/ plan:   Information/referral to community resources:   n/a--Pt wants [facility].    PATIENT'S/FAMILY'S RESPONSE TO PLAN OF CARE: Pt and family thanked CSW for time and assistance.   Providence Crosby, LCSWA Clinical Social Work [phone]

## 2013-02-27 NOTE — Progress Notes (Signed)
Subjective: 2 Days Post-Op Procedure(s) (LRB): RIGHT TOTAL HIP ARTHROPLASTY ANTERIOR APPROACH (Right) Patient reports pain as mild.    Objective: Vital signs in last 24 hours: Temp:  [97.7 F (36.5 C)-98.8 F (37.1 C)] 98.7 F (37.1 C) (05/11 0534) Pulse Rate:  [80-98] 98 (05/11 0534) Resp:  [15-20] 20 (05/11 0534) BP: (103-137)/(61-75) 137/75 mmHg (05/11 0534) SpO2:  [95 %-99 %] 97 % (05/11 0534)  Intake/Output from previous day: 05/10 0701 - 05/11 0700 In: 960 [P.O.:960] Out: 925 [Urine:925] Intake/Output this shift:     Recent Labs  02/26/13 0553 02/27/13 0450  HGB 11.5* 11.1*    Recent Labs  02/26/13 0553 02/27/13 0450  WBC 10.3 10.8*  RBC 3.84* 3.76*  HCT 33.7* 32.6*  PLT 178 180    Recent Labs  02/26/13 0553  NA 137  K 4.1  CL 105  CO2 27  BUN 10  CREATININE 0.88  GLUCOSE 140*  CALCIUM 8.4    Recent Labs  02/26/13 0553 02/27/13 0450  INR 1.15 1.62*    Neurologically intact  Assessment/Plan: 2 Days Post-Op Procedure(s) (LRB): RIGHT TOTAL HIP ARTHROPLASTY ANTERIOR APPROACH (Right) Plan for discharge tomorrow Discharge to SNF     D/C  Saline lock.   Markey Deady C 02/27/2013, 8:17 AM

## 2013-02-27 NOTE — Progress Notes (Signed)
Patient adamant that her evening dose of Lovenox was administered around 4 pm. RN educated patient on our computer documentation, and that the Lovenox was scheduled every 12 hours and was scanned at 1000. Patient sternly refused administration of 2200 Lovenox. Margurete Guaman [place], [place] 02/27/2013 1:23 AM

## 2013-02-27 NOTE — Progress Notes (Signed)
ANTICOAGULATION CONSULT NOTE - Follow Up Consult  Pharmacy Consult for Warfarin Indication: VTE prophylaxis and history of PE  Allergies  Allergen Reactions  . Penicillins Rash    Patient Measurements: Height: 5\' 8"  (172.7 cm) Weight: 172 lb 2 oz (78.075 kg) IBW/kg (Calculated) : 63.9  Vital Signs: Temp: 98.7 F (37.1 C) (05/11 0534) Temp src: Oral (05/11 0534) BP: 137/75 mmHg (05/11 0534) Pulse Rate: 98 (05/11 0534)  Labs:  Recent Labs  02/25/13 0800 02/26/13 0553 02/27/13 0450  HGB  --  11.5* 11.1*  HCT  --  33.7* 32.6*  PLT  --  178 180  LABPROT 14.5 14.5 18.7*  INR 1.15 1.15 1.62*  CREATININE  --  0.88  --     Estimated Creatinine Clearance: 53.2 ml/min (by C-G formula based on Cr of 0.88).   Medications:  Scheduled:  . docusate sodium  100 mg Oral BID  . enoxaparin (LOVENOX) injection  40 mg Subcutaneous Q12H  . ferrous sulfate  325 mg Oral TID PC  . metoprolol succinate  25 mg Oral q morning - 10a  . multivitamin with minerals  1 tablet Oral Daily  . OxyCODONE  10 mg Oral Q12H  . [COMPLETED] warfarin  7.5 mg Oral ONCE-1800  . Warfarin - Pharmacist Dosing Inpatient   Does not apply q1800  . [DISCONTINUED] enoxaparin (LOVENOX) injection  1 mg/kg Subcutaneous Q24H   Infusions:  . sodium chloride 75 mL/hr at 02/25/13 1237  Inpatient warfarin doses: 7.5mg  (5/9), 7.5mg  (5/10)  Assessment: 79 yo F with a history of PE in 2011 on chronic anticoagulation with warfarin. Patient is now s/p THA on 5/9 and warfarin resumed postop.  Warfarin dose PTA: 5.5mg  daily, stopped 5/4 in anticipation of surgery  INR now responding (1.62)  CBC decreased as expected postop but stable today, no bleeding/complications reported.  MD has ordered Lovenox 40mg  sq q12 as bridge until INR therapeutic - this is somewhere between treatment and prophylactic dose. Verified with Dr. Ophelia Charter that this is the intention due to concerns for postop bleeding. Of note, patient refused 10pm  dose last night.   Goal of Therapy:  INR 2-3 Monitor platelets by anticoagulation protocol: Yes   Plan:   Warfarin 5.5mg  today per usual home dose  Daily PT/INR  Lovenox per MD  Loralee Pacas, PharmD, BCPS Pager: [phone] 02/27/2013,7:19 AM

## 2013-02-27 NOTE — Progress Notes (Signed)
Clinical Social Work Department CLINICAL SOCIAL WORK PLACEMENT NOTE 02/27/2013  Patient:  [name], [name]  Account Number:  [id number] Admit date:  02/25/2013  Clinical Social Worker:  Doroteo Glassman  Date/time:  02/27/2013 12:48 PM  Clinical Social Work is seeking post-discharge placement for this patient at the following level of care:   SKILLED NURSING   (*CSW will update this form in Epic as items are completed)   02/27/2013  Patient/family provided with [facility] Department of Clinical Social Work's list of facilities offering this level of care within the geographic area requested by the patient (or if unable, by the patient's family).  02/27/2013  Patient/family informed of their freedom to choose among providers that offer the needed level of care, that participate in Medicare, Medicaid or managed care program needed by the patient, have an available bed and are willing to accept the patient.  02/27/2013  Patient/family informed of [facility]' ownership interest in [facility], as well as of the fact that they are under no obligation to receive care at this facility.  PASARR submitted to EDS on 02/27/2013 PASARR number received from EDS on 02/27/2013  FL2 transmitted to all facilities in geographic area requested by pt/family on  02/27/2013 FL2 transmitted to all facilities within larger geographic area on   Patient informed that his/her managed care company has contracts with or will negotiate with  certain facilities, including the following:     Patient/family informed of bed offers received:   Patient chooses bed at  Physician recommends and patient chooses bed at    Patient to be transferred to  on   Patient to be transferred to facility by   The following physician request were entered in Epic:   Additional Comments:  Providence Crosby, Theresia Majors Clinical Social Work [phone]

## 2013-02-27 NOTE — Progress Notes (Signed)
Received in morning report from [facility], RN that pt refused night dose of lovenox stating received in late afternoon yesterday. Went in with [facility], Charity fundraiser and spoke with pt reminding that lovenox dose was given around 10am while myself and [place], NT were bathing her.  Pt was reassured and stated that she was probably confused.

## 2013-02-27 NOTE — Progress Notes (Signed)
Physical Therapy Treatment Patient Details Name: [name] MRN: [id number] DOB: April 21, 1928 Today's Date: 02/27/2013 Time: 4098-1191 PT Time Calculation (min): 38 min  PT Assessment / Plan / Recommendation Comments on Treatment Session  POD # 2 R Direct Anterior THR.  Assisted pt OOB to amb in hallway, assisted to BR to void then to recliner to perform TE's.  Pt required increased time.  Pt plans to D/C to [facility] for ST Rehab.    Follow Up Recommendations  SNF     Does the patient have the potential to tolerate intense rehabilitation     Barriers to Discharge        Equipment Recommendations  Rolling walker with 5" wheels    Recommendations for Other Services    Frequency 7X/week   Plan Discharge plan remains appropriate    Precautions / Restrictions Precautions Precautions: Fall Precaution Comments: Direct Anterior Restrictions Weight Bearing Restrictions: No Other Position/Activity Restrictions: WBAT   Pertinent Vitals/Pain C/o "soreness , "stiffness" ICE applied repositioned    Mobility  Bed Mobility Bed Mobility: Sit to Supine Sit to Supine: 4: Min assist Details for Bed Mobility Assistance: min assist to support R LE off bed Transfers Transfers: Sit to Stand;Stand to Sit Sit to Stand: 3: Mod assist;From bed;From toilet Stand to Sit: 3: Mod assist;To chair/3-in-1;To toilet Details for Transfer Assistance: 25% VC'a on proper tech and handplacement plus safety with turns Ambulation/Gait Ambulation/Gait Assistance: 4: Min assist Ambulation Distance (Feet): 65 Feet Assistive device: Rolling walker Ambulation/Gait Assistance Details: 25% VC's on safety with turns and increased time Gait Pattern: Step-to pattern;Decreased step length - left;Decreased stance time - right Gait velocity: decreased    Exercises   Total Hip Replacement TE's 10 reps ankle pumps 10 reps knee presses 10 reps heel slides 10 reps SAQ's 10 reps ABD Followed by ICE   PT  Goals                                                      progressing    Visit Information  Last PT Received On: 02/27/13 Assistance Needed: +1    Subjective Data      Cognition    good   Balance   fair  End of Session PT - End of Session Equipment Utilized During Treatment: Gait belt Activity Tolerance: Patient tolerated treatment well Patient left: in chair;with call bell/phone within reach;with family/visitor present   Felecia Shelling  PTA [facility]  Acute  Rehab Pager      [phone]

## 2013-02-28 ENCOUNTER — Encounter (HOSPITAL_COMMUNITY): Payer: Self-pay | Admitting: Orthopaedic Surgery

## 2013-02-28 LAB — CBC
Hemoglobin: 10.9 g/dL — ABNORMAL LOW (ref 12.0–15.0)
MCH: 28.9 pg (ref 26.0–34.0)
MCHC: 33.6 g/dL (ref 30.0–36.0)
Platelets: 169 10*3/uL (ref 150–400)
RDW: 14.3 % (ref 11.5–15.5)

## 2013-02-28 LAB — PROTIME-INR
INR: 1.71 — ABNORMAL HIGH (ref 0.00–1.49)
Prothrombin Time: 19.5 seconds — ABNORMAL HIGH (ref 11.6–15.2)

## 2013-02-28 NOTE — Progress Notes (Signed)
Physical Therapy Treatment Patient Details Name: [name] MRN: [id number] DOB: 08/12/1928 Today's Date: 02/28/2013 Time: 4098-1191 PT Time Calculation (min): 25 min  PT Assessment / Plan / Recommendation Comments on Treatment Session  POD # 3 R Direct Anterior THR.  Assisted OOB to BR to void then amb in hallway.  Pt progressing well and plans to D/C to SNF for ST Rehab.    Follow Up Recommendations  SNF     Does the patient have the potential to tolerate intense rehabilitation     Barriers to Discharge        Equipment Recommendations  Rolling walker with 5" wheels    Recommendations for Other Services    Frequency 7X/week   Plan Discharge plan remains appropriate    Precautions / Restrictions Precautions Precautions: Fall Precaution Comments: Direct Anterior Restrictions Weight Bearing Restrictions: No Other Position/Activity Restrictions: WBAT   Pertinent Vitals/Pain C/o "stiffness" ICE applied    Mobility  Bed Mobility Bed Mobility: Supine to Sit Supine to Sit: 4: Min assist Details for Bed Mobility Assistance: min assist to support R LE off bed Transfers Transfers: Sit to Stand;Stand to Sit Sit to Stand: 4: Min guard;With upper extremity assist;From chair/3-in-1 Stand to Sit: 4: Min guard;With upper extremity assist;To chair/3-in-1 Details for Transfer Assistance: min verbal cues for safety Ambulation/Gait Ambulation/Gait Assistance: 4: Min assist Ambulation Distance (Feet): 85 Feet Assistive device: Rolling walker Ambulation/Gait Assistance Details: <25% Vc's on safety with turns and backward gait to chair Gait Pattern: Step-to pattern;Decreased step length - left;Decreased stance time - right Gait velocity: decreased     PT Goals                                                     progressing    Visit Information  Last PT Received On: 02/28/13 Assistance Needed: +1    Subjective Data      Cognition  Cognition Arousal/Alertness:  Awake/alert Behavior During Therapy: WFL for tasks assessed/performed Overall Cognitive Status: Within Functional Limits for tasks assessed    Balance  Balance Balance Assessed: Yes Dynamic Standing Balance Dynamic Standing - Level of Assistance: 4: Min assist  End of Session PT - End of Session Equipment Utilized During Treatment: Gait belt Activity Tolerance: Patient tolerated treatment well Patient left: in chair;with call bell/phone within reach;with family/visitor present Nurse Communication: Mobility status   Felecia Shelling  PTA [facility]  Acute  Rehab Pager      [phone]

## 2013-02-28 NOTE — Progress Notes (Signed)
ANTICOAGULATION CONSULT NOTE - Follow Up Consult  Pharmacy Consult for Warfarin Indication: VTE prophylaxis and history of PE  Allergies  Allergen Reactions  . Penicillins Rash    Patient Measurements: Height: 5\' 8"  (172.7 cm) Weight: 172 lb 2 oz (78.075 kg) IBW/kg (Calculated) : 63.9  Vital Signs: Temp: 98.9 F (37.2 C) (05/12 0530) Temp src: Oral (05/12 0530) BP: 116/58 mmHg (05/12 0530) Pulse Rate: 88 (05/12 0530)  Labs:  Recent Labs  02/26/13 0553 02/27/13 0450 02/28/13 0413  HGB 11.5* 11.1* 10.9*  HCT 33.7* 32.6* 32.4*  PLT 178 180 169  LABPROT 14.5 18.7* 19.5*  INR 1.15 1.62* 1.71*  CREATININE 0.88  --   --     Estimated Creatinine Clearance: 53.2 ml/min (by C-G formula based on Cr of 0.88).   Medications:  Scheduled:  . docusate sodium  100 mg Oral BID  . ferrous sulfate  325 mg Oral TID PC  . metoprolol succinate  25 mg Oral q morning - 10a  . multivitamin with minerals  1 tablet Oral Daily  . OxyCODONE  10 mg Oral Q12H  . [COMPLETED] warfarin  5.5 mg Oral ONCE-1800  . Warfarin - Pharmacist Dosing Inpatient   Does not apply q1800  . [DISCONTINUED] enoxaparin (LOVENOX) injection  40 mg Subcutaneous Q12H   Infusions:  . [DISCONTINUED] sodium chloride 75 mL/hr at 02/25/13 1237  Inpatient warfarin doses: 7.5mg  (5/9), 7.5mg  (5/10), 5.5 mg (5/11)  Assessment: 79 yo F with a history of PE in 2011, on chronic anticoagulation with warfarin. Patient is now s/p THA on 5/9 and warfarin resumed postop.  Warfarin dose PTA: 5.5mg  daily, stopped 5/4 in anticipation of surgery  INR responding after reinitiation of warfarin 5/9.  No bleeding reported in chart notes.  MD has ordered Lovenox 40mg  sq q12 as bridge until INR therapeutic - this is somewhere between treatment and prophylactic dose. Verified with Dr. Ophelia Charter that this is the intention due to concerns for postop bleeding. Of note, patient refused 10pm dose last night.   Goal of Therapy:  INR  2-3 Monitor platelets by anticoagulation protocol: Yes   Recommend:   Warfarin 5.5 mg PO today as per usual home dosage  Daily PT/INR  Lovenox 40 mg SQ q 12 h as ordered by ortho; discontinue when INR 2.0 or above.  Reduce frequency of INR monitoring to 3x per week when INR therapeutic and stabilizing.  Elie Goody, PharmD, BCPS Pager: [phone] 02/28/2013  8:32 AM

## 2013-02-28 NOTE — Progress Notes (Signed)
Subjective: 3 Days Post-Op Procedure(s) (LRB): RIGHT TOTAL HIP ARTHROPLASTY ANTERIOR APPROACH (Right) Patient reports pain as mild.  Asymptomatic acute blood loss anemia.  Objective: Vital signs in last 24 hours: Temp:  [98.1 F (36.7 C)-99.5 F (37.5 C)] 98.9 F (37.2 C) (05/12 0530) Pulse Rate:  [88-89] 88 (05/12 0530) Resp:  [16-20] 20 (05/12 0530) BP: (116-131)/(54-63) 116/58 mmHg (05/12 0530) SpO2:  [96 %-98 %] 98 % (05/12 0530)  Intake/Output from previous day: 05/11 0701 - 05/12 0700 In: 300 [P.O.:300] Out: -  Intake/Output this shift:     Recent Labs  02/26/13 0553 02/27/13 0450 02/28/13 0413  HGB 11.5* 11.1* 10.9*    Recent Labs  02/27/13 0450 02/28/13 0413  WBC 10.8* 7.7  RBC 3.76* 3.77*  HCT 32.6* 32.4*  PLT 180 169    Recent Labs  02/26/13 0553  NA 137  K 4.1  CL 105  CO2 27  BUN 10  CREATININE 0.88  GLUCOSE 140*  CALCIUM 8.4    Recent Labs  02/27/13 0450 02/28/13 0413  INR 1.62* 1.71*    Sensation intact distally Intact pulses distally Dorsiflexion/Plantar flexion intact Incision: dressing C/D/I No cellulitis present Compartment soft  Assessment/Plan: 3 Days Post-Op Procedure(s) (LRB): RIGHT TOTAL HIP ARTHROPLASTY ANTERIOR APPROACH (Right) Discharge to SNF  [name] Y 02/28/2013, 7:23 AM

## 2013-02-28 NOTE — Discharge Summary (Signed)
Patient ID: [name] MRN: [id number] DOB/AGE: [birth year] 79 y.o.  Admit date: 02/25/2013 Discharge date: 02/28/2013  Admission Diagnoses:  Principal Problem:   Degenerative arthritis of hip   Discharge Diagnoses:  Same  Past Medical History  Diagnosis Date  . Colonic polyp   . Hyperlipemia   . Peripheral neuropathy   . Personal history of PE (pulmonary embolism) 2011  . Numbness in feet   . 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 Arthritis   . Difficulty sleeping     Surgeries: Procedure(s): RIGHT TOTAL HIP ARTHROPLASTY ANTERIOR APPROACH on 02/25/2013   Consultants:    Discharged Condition: Improved  Hospital Course: [name] is an 79 y.o. female who was admitted 02/25/2013 for operative treatment ofDegenerative arthritis of hip. Patient has severe unremitting pain that affects sleep, daily activities, and work/hobbies. After pre-op clearance the patient was taken to the operating room on 02/25/2013 and underwent  Procedure(s): RIGHT TOTAL HIP ARTHROPLASTY ANTERIOR APPROACH.    Patient was given perioperative antibiotics: Anti-infectives   Start     Dose/Rate Route Frequency Ordered Stop   02/25/13 1600  clindamycin (CLEOCIN) IVPB 600 mg     600 mg 100 mL/hr over 30 Minutes Intravenous Every 6 hours 02/25/13 1353 02/25/13 2326   02/25/13 0723  clindamycin (CLEOCIN) IVPB 900 mg     900 mg 100 mL/hr over 30 Minutes Intravenous On call to O.R. 02/25/13 0723 02/25/13 1013       Patient was given sequential compression devices, early ambulation, and chemoprophylaxis to prevent DVT.  Patient benefited maximally from hospital stay and there were no complications.    Recent vital signs: Patient Vitals for the past 24 hrs:  BP Temp Temp src Pulse Resp SpO2  02/28/13 0530 116/58 mmHg 98.9 F (37.2 C) Oral 88 20 98 %  02/27/13 2234 131/63 mmHg 99.5 F (37.5 C) Oral 89 20 98 %  02/27/13 2000 - - - - 16 -  02/27/13 1600 - - - - 18 98 %  02/27/13 1425  116/54 mmHg 98.1 F (36.7 C) Oral 89 18 96 %  02/27/13 1200 - - - - 20 98 %  02/27/13 0800 - - - - 20 98 %     Recent laboratory studies:  Recent Labs  02/26/13 0553 02/27/13 0450 02/28/13 0413  WBC 10.3 10.8* 7.7  HGB 11.5* 11.1* 10.9*  HCT 33.7* 32.6* 32.4*  PLT 178 180 169  NA 137  --   --   K 4.1  --   --   CL 105  --   --   CO2 27  --   --   BUN 10  --   --   CREATININE 0.88  --   --   GLUCOSE 140*  --   --   INR 1.15 1.62* 1.71*  CALCIUM 8.4  --   --      Discharge Medications:     Medication List    STOP taking these medications       acetaminophen 325 MG tablet  Commonly known as:  TYLENOL      TAKE these medications       enoxaparin 40 MG/0.4ML injection  Commonly known as:  LOVENOX  Inject 0.4 mLs (40 mg total) into the skin every 12 (twelve) hours.     methocarbamol 500 MG tablet  Commonly known as:  ROBAXIN  Take 1 tablet (500 mg total) by mouth every 6 (six) hours  as needed.     metoprolol succinate 25 MG 24 hr tablet  Commonly known as:  TOPROL-XL  Take 25 mg by mouth every morning.     multivitamin with minerals Tabs  Take 1 tablet by mouth daily.     oxyCODONE-acetaminophen 5-325 MG per tablet  Commonly known as:  ROXICET  Take 1-2 tablets by mouth every 4 (four) hours as needed for pain.     VIACTIV 500-500-40 MG-UNT-MCG Chew  Generic drug:  Calcium-Vitamin D-Vitamin K     warfarin 5 MG tablet  Commonly known as:  COUMADIN  Take 5 tablets by mouth daily. Take with 0.5mg  to make 5.5 mg dose     warfarin 1 MG tablet  Commonly known as:  COUMADIN  Take 0.5 mg by mouth daily. Take with 5mg  to make 5.5 mg dose        Diagnostic Studies: Dg Chest 2 View  02/17/2013  *RADIOLOGY REPORT*  Clinical Data: Preoperative evaluation prior to right total hip replacement.  History of pulmonary embolism.  CHEST - 2 VIEW  Comparison: Chest x-ray 08/06/2010.  Findings: There is a 1.1 cm nodular density in the left apex projecting between the  posterior aspects of the left third and fourth ribs just medial to lateral aspect of the the left first rib, which is more conspicuous than than prior examination.  Lung volumes are normal.  No acute consolidative airspace disease.  No pleural effusions.  Pulmonary vasculature and the cardiomediastinal silhouette are within normal limits.  Atherosclerosis in the thoracic aorta.  Multiple surgical clips project over the upper abdomen.  IMPRESSION: 1.  No radiographic evidence of acute cardiopulmonary disease. 2.  Increased size of a nodular density projecting over the left apex.  This may simply represent an area of post infectious or inflammatory scarring, however, the possibility of a small neoplasm is not excluded.  Correlation with non emergent chest CT is recommended at this time. 3.  Atherosclerosis.  These results will be called to the ordering clinician or representative by the Radiologist Assistant, and communication documented in the PACS Dashboard.   Original Report Authenticated By: Trudie Reed, M.D.    Dg Hip Complete Right  02/25/2013  *RADIOLOGY REPORT*  Clinical Data: Osteoarthritis right hip.  RIGHT HIP - COMPLETE 2+ VIEW  Fluoroscopic time:  20-second  Comparison: 10/29/2009.  Findings: Four C-arm views submitted for review after surgery.  Total right hip replacement appears in satisfactory position on the single projection obtained.  IMPRESSION: Total right hip replacement appears in satisfactory position on the single projection obtained.   Original Report Authenticated By: Lacy Duverney, M.D.    Dg Pelvis Portable  02/25/2013  *RADIOLOGY REPORT*  Clinical Data: Post right hip replacement.  PORTABLE PELVIS,PORTABLE RIGHT HIP - 1 VIEW  Comparison: 10/29/2009.  Findings: Total right hip replacement appears in satisfactory position without complication noted.  Left hip joint degenerative changes and degenerative changes lower lumbar spine.  IMPRESSION: Total right hip replacement appears in  satisfactory position without complication noted. Frontal view barely includes the tip of the femoral component.   Original Report Authenticated By: Lacy Duverney, M.D.    Dg Hip Portable 1 View Right  02/25/2013  *RADIOLOGY REPORT*  Clinical Data: Post right hip replacement.  PORTABLE PELVIS,PORTABLE RIGHT HIP - 1 VIEW  Comparison: 10/29/2009.  Findings: Total right hip replacement appears in satisfactory position without complication noted.  Left hip joint degenerative changes and degenerative changes lower lumbar spine.  IMPRESSION: Total right hip replacement appears  in satisfactory position without complication noted. Frontal view barely includes the tip of the femoral component.   Original Report Authenticated By: Lacy Duverney, M.D.    Dg C-arm 1-60 Min-no Report  02/25/2013  CLINICAL DATA: anterior hip   C-ARM 1-60 MINUTES  Fluoroscopy was utilized by the requesting physician.  No radiographic  interpretation.      Disposition: to short-term skilled nursing facility      Discharge Orders   Future Orders Complete By Expires     Call MD / Call 911  As directed     Comments:      If you experience chest pain or shortness of breath, CALL 911 and be transported to the hospital emergency room.  If you develope a fever above 101 F, pus (white drainage) or increased drainage or redness at the wound, or calf pain, call your surgeon's office.    Constipation Prevention  As directed     Comments:      Drink plenty of fluids.  Prune juice may be helpful.  You may use a stool softener, such as Colace (over the counter) 100 mg twice a day.  Use MiraLax (over the counter) for constipation as needed.    Diet - low sodium heart healthy  As directed     Discharge patient  As directed     Discharge wound care:  As directed     Comments:      Keep dressing clean dry and intact until follow-up in 2 weeks. Can get current dressing wet in the shower.    Increase activity slowly as tolerated  As directed      Weight bearing as tolerated  As directed        Follow-up Information   Follow up with Kathryne Hitch, MD. Schedule an appointment as soon as possible for a visit in 2 weeks.   Contact information:   [address] [place] [address] [phone]        Signed: Kathryne Hitch 02/28/2013, 7:25 AM

## 2013-02-28 NOTE — Progress Notes (Addendum)
Occupational Therapy Treatment Patient Details Name: [name] MRN: [id number] DOB: 1928/05/16 Today's Date: 02/28/2013 Time: 9629-5284 OT Time Calculation (min): 21 min  OT Assessment / Plan / Recommendation Comments on Treatment Session Pt supposed to d/c to SNF today. Progressing well with ADL.    Follow Up Recommendations  SNF;Supervision/Assistance - 24 hour    Barriers to Discharge       Equipment Recommendations  3 in 1 bedside comode    Recommendations for Other Services    Frequency Min 2X/week   Plan Discharge plan remains appropriate    Precautions / Restrictions Precautions Precautions: Fall Precaution Comments: Direct Anterior Restrictions Weight Bearing Restrictions: No Other Position/Activity Restrictions: WBAT        ADL  Lower Body Bathing: Simulated;Minimal assistance (with LHS) Where Assessed - Lower Body Bathing: Supported sit to stand Lower Body Dressing: Performed;Minimal assistance (with AE. see below) Where Assessed - Lower Body Dressing: Supported sit to stand Toilet Transfer: Mining engineer Method: Stand pivot Equipment Used: Rolling walker ADL Comments: Used walker to pivot around to sink to brush teeth and transfer back to recliner. Min verbal cues for aligning with chair before sitting. Pt used all AE to practice LB ADL and did well with it today. She doffed sock with reacher and had some difficulty due to not being able to pick R foot up off the floor well. But did well with reacher to don underwear. PRacticed with long shoe horn, sponge and sock aid also. Pt supposed to d/c SNF today. Explained AE coverage to pt and family. Also let pt know she can see how she can progress with reaching down to perform LB ADL and can always purchase a kit later if she is still having difficulty. Pt verbalized understanding.     OT Diagnosis:    OT Problem List:   OT Treatment Interventions:     OT Goals ADL Goals ADL  Goal: Lower Body Bathing - Progress: Progressing toward goals ADL Goal: Lower Body Dressing - Progress: Progressing toward goals ADL Goal: Toilet Transfer - Progress: Progressing toward goals  Visit Information  Last OT Received On: 02/28/13 Assistance Needed: +1    Subjective Data  Subjective: I can use all the practice I can get Patient Stated Goal: wants to return to independence   Prior Functioning       Cognition  Cognition Arousal/Alertness: Awake/alert Behavior During Therapy: WFL for tasks assessed/performed Overall Cognitive Status: Within Functional Limits for tasks assessed    Mobility  Transfers Transfers: Sit to Stand;Stand to Sit Sit to Stand: 4: Min guard;With upper extremity assist;From chair/3-in-1 Stand to Sit: 4: Min guard;With upper extremity assist;To chair/3-in-1 Details for Transfer Assistance: min verbal cues for safety    Exercises      Balance Balance Balance Assessed: Yes Dynamic Standing Balance Dynamic Standing - Level of Assistance: 4: Min assist   End of Session OT - End of Session Activity Tolerance: Patient tolerated treatment well Patient left: in chair;with call bell/phone within reach;with family/visitor present  GO     Lennox Laity [phone] 02/28/2013, 10:23 AM

## 2013-02-28 NOTE — Progress Notes (Signed)
Clinical Social Work Department CLINICAL SOCIAL WORK PLACEMENT NOTE 02/28/2013  Patient:  [name], [name]  Account Number:  [id number] Admit date:  02/25/2013  Clinical Social Worker:  Doroteo Glassman  Date/time:  02/27/2013 12:48 PM  Clinical Social Work is seeking post-discharge placement for this patient at the following level of care:   SKILLED NURSING   (*CSW will update this form in Epic as items are completed)   02/27/2013  Patient/family provided with [facility] Department of Clinical Social Work's list of facilities offering this level of care within the geographic area requested by the patient (or if unable, by the patient's family).  02/27/2013  Patient/family informed of their freedom to choose among providers that offer the needed level of care, that participate in Medicare, Medicaid or managed care program needed by the patient, have an available bed and are willing to accept the patient.  02/27/2013  Patient/family informed of [facility]' ownership interest in [facility], as well as of the fact that they are under no obligation to receive care at this facility.  PASARR submitted to EDS on 02/27/2013 PASARR number received from EDS on 02/27/2013  FL2 transmitted to all facilities in geographic area requested by pt/family on  02/27/2013 FL2 transmitted to all facilities within larger geographic area on   Patient informed that his/her managed care company has contracts with or will negotiate with  certain facilities, including the following:     Patient/family informed of bed offers received:  02/28/2013 Patient chooses bed at [facility] Physician recommends and patient chooses bed at    Patient to be transferred to [facility] on  02/28/2013 Patient to be transferred to facility by [facility]  The following physician request were entered in Epic:   Additional Comments: Cori Razor LCSW [phone]

## 2013-03-24 ENCOUNTER — Ambulatory Visit
Admission: RE | Admit: 2013-03-24 | Discharge: 2013-03-24 | Disposition: A | Discharge status: Home / Self Care | Payer: Medicare Other | Source: Ambulatory Visit | Attending: Family Medicine | Admitting: Family Medicine

## 2013-03-24 ENCOUNTER — Other Ambulatory Visit: Payer: Self-pay | Admitting: Family Medicine

## 2013-05-20 ENCOUNTER — Other Ambulatory Visit: Payer: Self-pay

## 2013-05-20 DIAGNOSIS — Z1231 Encounter for screening mammogram for malignant neoplasm of breast: Secondary | ICD-10-CM

## 2013-06-17 ENCOUNTER — Ambulatory Visit
Admission: RE | Admit: 2013-06-17 | Discharge: 2013-06-17 | Disposition: A | Discharge status: Home / Self Care | Payer: Medicare Other | Source: Ambulatory Visit

## 2013-06-17 DIAGNOSIS — Z1231 Encounter for screening mammogram for malignant neoplasm of breast: Secondary | ICD-10-CM

## 2013-09-30 ENCOUNTER — Telehealth: Payer: Self-pay | Admitting: Neurology

## 2013-09-30 NOTE — Telephone Encounter (Signed)
Left message for patient to schedule follow up appointment with Dr. Terrace Arabia.

## 2013-10-04 ENCOUNTER — Telehealth: Payer: Self-pay | Admitting: Neurology

## 2013-10-04 NOTE — Telephone Encounter (Signed)
Patient scheduled/confirmed appt

## 2013-10-07 ENCOUNTER — Ambulatory Visit (INDEPENDENT_AMBULATORY_CARE_PROVIDER_SITE_OTHER): Payer: Medicare Other | Admitting: Neurology

## 2013-10-07 ENCOUNTER — Encounter: Payer: Self-pay | Admitting: Neurology

## 2013-10-07 VITALS — BP 133/84 | HR 77 | Ht 67.0 in | Wt 273.0 lb

## 2013-10-07 DIAGNOSIS — M25551 Pain in right hip: Secondary | ICD-10-CM

## 2013-10-07 DIAGNOSIS — M25559 Pain in unspecified hip: Secondary | ICD-10-CM

## 2013-10-07 DIAGNOSIS — M169 Osteoarthritis of hip, unspecified: Secondary | ICD-10-CM

## 2013-10-07 DIAGNOSIS — M1610 Unilateral primary osteoarthritis, unspecified hip: Secondary | ICD-10-CM

## 2013-10-07 DIAGNOSIS — G609 Hereditary and idiopathic neuropathy, unspecified: Secondary | ICD-10-CM

## 2013-10-07 MED ORDER — GABAPENTIN 100 MG PO CAPS: ORAL_CAPSULE | ORAL | Status: DC

## 2013-10-07 NOTE — Progress Notes (Signed)
[name] is apresent 79 years old right-handed Caucasian female, referred by her primary care physician Dr. Rosario Adie from [place] [facility] for evaluation of peripheral neuropathy.  She has past medical history of bilateral pulmonary emboli, on chronic Coumadin treatment, hyperlipidemia, previous history of severe endometriosis, with total hysterectomy, also right nephrectomy due to erosion from endometriosis, chronic right hip pain, is considering right hip replacement  She reports 5 years history of carotid onset bilateral feet paresthesia, initial symptoms was left toes, plantar surface, over the past 4 years, gradually involving her right foot, occasionally tingling discomfort at the distal leg, she now complains of constant bilateral toes, plantar foot numbness, no pain, no needle prick sensation, no burning, she has mild gait difficulty due to the right hip pain, she denies significant neck pain, no incontinence, no finger paresthesia  EMG/NCS was normal in April 2014.  Labs, normal B12, RPR, IPEP, CBC, CMP, She had right hip replacement in May 2014 by Dr. Magnus Ivan, she recovered well, ambulating is much better.  She still has mild bilateral toes, plantar feet paresthesia, cold.no pain, walking on blocks.  She has heel burning, difficulty sleep sometimes.  She does bed exercise,   Review of system: She complains of numbness, otherwise deny constitutional, cardiac, pulmonary, ENT, skin, respiratory, GI, endocrinology complains,   PHYSICAL EXAMINATOINS:  Generalized: In no acute distress  Neck: Supple, no carotid bruits   Cardiac: Regular rate rhythm  Pulmonary: Clear to auscultation bilaterally  Musculoskeletal: No deformity  Neurological examination  Mentation: Alert oriented to time, place, history taking, and causual conversation  Cranial nerve II-XII: Pupils were equal round reactive to light extraocular movements were full, visual field were full on confrontational test.  facial sensation and strength were normal. hearing was intact to finger rubbing bilaterally. Uvula tongue midline.  head turning and shoulder shrug and were normal and symmetric.Tongue protrusion into cheek strength was normal.  Motor: normal tone, bulk and strength.  Sensory: mildly length dependent decreased light touch, pinprick, preserved vibratory sensation, and proprioception at toes.  Coordination: Normal finger to nose, heel-to-shin bilaterally there was no truncal ataxia  Gait: Rising up from seated position by pushing in chair arm, normal stance, without trunk ataxia, moderate stride, dragging right leg, limp due to right hip pain. Romberg signs: Negative  Deep tendon reflexes: Brachioradialis 2/2, biceps 2/2, triceps 2/2, patellar 2/2, Achilles 1/1, plantar responses were flexor bilaterally.  Assessment and plan:  79 years old Caucasian female, with gradual onset bilateral feet paresthesia, most consistent with small fiber neuropathy, she wants to complete evaluation before proceeding to right hip replacement,   1, lab fail to demonstrate etiology. 2 neurontin 100mg  1-3 tabs qhs. 3. Skin biopsy

## 2013-11-09 ENCOUNTER — Telehealth: Payer: Self-pay

## 2013-11-09 NOTE — Telephone Encounter (Signed)
Called and left patient a message asking her if she still wanted to skin biopsy done.

## 2013-11-14 ENCOUNTER — Encounter (INDEPENDENT_AMBULATORY_CARE_PROVIDER_SITE_OTHER): Payer: Self-pay

## 2013-11-14 ENCOUNTER — Ambulatory Visit (INDEPENDENT_AMBULATORY_CARE_PROVIDER_SITE_OTHER): Payer: Medicare Other | Admitting: Neurology

## 2013-11-14 ENCOUNTER — Encounter: Payer: Self-pay | Admitting: Neurology

## 2013-11-14 VITALS — BP 128/80 | HR 85 | Ht 67.0 in | Wt 176.0 lb

## 2013-11-14 DIAGNOSIS — G609 Hereditary and idiopathic neuropathy, unspecified: Secondary | ICD-10-CM

## 2013-11-14 DIAGNOSIS — G629 Polyneuropathy, unspecified: Secondary | ICD-10-CM

## 2013-11-14 NOTE — Progress Notes (Signed)
80 years old female, with 5 years history of gradual onset bilateral feet paresthesia, had normal EMG nerve conduction study, laboratory failed to find etiology, came in for skin biopsy looking for potential cause of her presumed small fiber peripheral neuropathy  Patient was in right lateral recombinant position. Sterile technique. 1% lidocaine with epinephrine was used for local anesthesia. Punctuated skin biopsy was performed. 3 mm skin sample were obtained at at left foot, above left extensor digitorum brevis, and left lateral calf, 10 cm above lateral malleolus. 20 cm below superior iliac spine.  Patient tolerated the procedure well.  The wound was covered with neosporin antibiotic cream and bandage.

## 2013-11-29 ENCOUNTER — Telehealth: Payer: Self-pay | Admitting: Neurology

## 2013-11-29 NOTE — Telephone Encounter (Signed)
Pt called stating that she would like to get the results from the tests she had done.  Please call.  Thank you

## 2013-11-30 NOTE — Telephone Encounter (Signed)
I have called her, left a message, skin biopsy showed no significant evidence of nerve damage,  Even though on the skin biopsy report, left foot, and left leg skin biopsy was reported normal, but there was significantly reduced epiderm nerve fiber density at the left thigh, which does not consistent with the length dependent pattern, patient's skin was very fragile during the procedure, more than likely above findings are due to technical difficulties

## 2013-11-30 NOTE — Telephone Encounter (Signed)
I called [facility] and spoke to [place], he is faxing over the report.  The report is sent to Dr. Krista Blue for review.

## 2013-12-21 ENCOUNTER — Encounter: Payer: Self-pay | Admitting: Neurology

## 2014-02-20 ENCOUNTER — Other Ambulatory Visit: Payer: Self-pay

## 2014-02-20 ENCOUNTER — Telehealth: Payer: Self-pay | Admitting: *Deleted

## 2014-02-20 MED ORDER — METOPROLOL SUCCINATE ER 25 MG PO TB24: 25.0000 mg | ORAL_TABLET | Freq: Every morning | ORAL | Status: DC

## 2014-02-20 NOTE — Telephone Encounter (Signed)
Patient is out of her metoprolol and would like it to be sent to [facility] on [address]. Thanks, MI

## 2014-02-23 NOTE — Telephone Encounter (Signed)
Rx refilled.

## 2014-03-15 ENCOUNTER — Encounter: Payer: Self-pay | Admitting: Nurse Practitioner

## 2014-03-15 ENCOUNTER — Ambulatory Visit (INDEPENDENT_AMBULATORY_CARE_PROVIDER_SITE_OTHER): Payer: Medicare Other | Admitting: Nurse Practitioner

## 2014-03-15 ENCOUNTER — Encounter (INDEPENDENT_AMBULATORY_CARE_PROVIDER_SITE_OTHER): Payer: Self-pay

## 2014-03-15 VITALS — BP 128/77 | HR 65 | Ht 67.0 in | Wt 172.0 lb

## 2014-03-15 DIAGNOSIS — G609 Hereditary and idiopathic neuropathy, unspecified: Secondary | ICD-10-CM

## 2014-03-15 NOTE — Patient Instructions (Signed)
Continue Gabapentin at current dose, does not need refills Continue daily exercise  F/U in 6 months

## 2014-03-15 NOTE — Progress Notes (Signed)
[facility]  PATIENT: [name] DOB: 1928-06-02   REASON FOR VISIT: follow up for neuropathy   HISTORY OF PRESENT ILLNESS: [name], 80 year old female returns for followup. She has a history of 5 years bilateral feet paresthesias, occasional tingling discomfort distally in the legs intermittent plantar foot numbness denies any burning pain,  tingling. She denies any bowel, bladder incontinence. No symptoms in the upper extremities. She denies any specific gait abnormality, no falls. She had nerve biopsy by Dr. Krista Blue 11/14/2013 which was essentially normal. She returns for reevaluation. She is currently on gabapentin for her symptoms without side effects   HISTORY:evaluation of peripheral neuropathy.  She has past medical history of bilateral pulmonary emboli, on chronic Coumadin treatment, hyperlipidemia, previous history of severe endometriosis, with total hysterectomy, also right nephrectomy due to erosion from endometriosis, chronic right hip pain, is considering right hip replacement  She reports 5 years history of carotid onset bilateral feet paresthesia, initial symptoms was left toes, plantar surface, over the past 4 years, gradually involving her right foot, occasionally tingling discomfort at the distal leg, she now complains of constant bilateral toes, plantar foot numbness, no pain, no needle prick sensation, no burning, she has mild gait difficulty due to the right hip pain, she denies significant neck pain, no incontinence, no finger paresthesia  EMG/NCS was normal in April 2014.  Labs, normal B12, RPR, IPEP, CBC, CMP, She had right hip replacement in May 2014 by Dr. Ninfa Linden, she recovered well, ambulating is much better. She still has mild bilateral toes, plantar feet paresthesia, cold.no pain, walking on blocks. She has heel burning, difficulty sleep sometimes.  She does bed exercise,    REVIEW OF SYSTEMS: Full 14 system review of systems performed  and notable only for those listed, all others are neg:  Constitutional: N/A  Cardiovascular: N/A  Ear/Nose/Throat: N/A  Skin: N/A  Eyes: N/A  Respiratory: N/A  Gastroitestinal: N/A  Hematology/Lymphatic: N/A  Endocrine: N/A Musculoskeletal:N/A  Allergy/Immunology: N/A  Neurological: Numbness of the feet  Psychiatric: N/A Sleep : NA   ALLERGIES: Allergies  Allergen Reactions  . Penicillins Rash    HOME MEDICATIONS: Outpatient Prescriptions Prior to Visit  Medication Sig Dispense Refill  . Calcium-Vitamin D-Vitamin K (VIACTIV) [id number] MG-UNT-MCG CHEW       . gabapentin (NEURONTIN) 100 MG capsule 1-3 tabs po qhs.  90 capsule  12  . metoprolol succinate (TOPROL-XL) 25 MG 24 hr tablet Take 1 tablet (25 mg total) by mouth every morning.  30 tablet  3  . Multiple Vitamin (MULTIVITAMIN WITH MINERALS) TABS Take 1 tablet by mouth daily.      Marland Kitchen warfarin (COUMADIN) 5 MG tablet Take 5 tablets by mouth daily. Take with 0.5mg  to make 5.5 mg dose       No facility-administered medications prior to visit.    PAST MEDICAL HISTORY: Past Medical History  Diagnosis Date  . Colonic polyp   . Hyperlipemia   . Peripheral neuropathy   . Personal history of PE (pulmonary embolism) 2011  . Numbness in feet   . 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 Arthritis   . Difficulty sleeping     PAST SURGICAL HISTORY: Past Surgical History  Procedure Laterality Date  . Nephrectomy  1993    RT -   . Cesarean section    . Appendectomy    . Abdominal hysterectomy    . Total hip arthroplasty Right 02/25/2013    Procedure:  RIGHT TOTAL HIP ARTHROPLASTY ANTERIOR APPROACH;  Surgeon: Mcarthur Rossetti, MD;  Location: [facility];  Service: Orthopedics;  Laterality: Right;    FAMILY HISTORY: Family History  Problem Relation Age of Onset  . Hypertension Father   . Diabetes Father   . Hypertension Mother   . Colon cancer Sister     SOCIAL HISTORY: History   Social History  . Marital  Status: Widowed    Spouse Name: N/A    Number of Children: 2  . Years of Education: masters   Occupational History  .      retired   Social History Main Topics  . Smoking status: Former Smoker    Quit date: 02/18/1983  . Smokeless tobacco: Never Used  . Alcohol Use: No  . Drug Use: No  . Sexual Activity: Not on file   Other Topics Concern  . Not on file   Social History Narrative   Patient lives at home alone . - Widow.   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   Caffeine- One cup daily.      PHYSICAL EXAM  Filed Vitals:   03/15/14 0836  BP: 128/77  Pulse: 65  Height: 5\' 7"  (1.702 m)  Weight: 172 lb (78.019 kg)   Body mass index is 26.93 kg/(m^2).  Generalized: Well developed, in no acute distress  Head: normocephalic and atraumatic,. Oropharynx benign  Neck: Supple, no carotid bruits  Cardiac: Regular rate rhythm, no murmur  Musculoskeletal: No deformity   Neurological examination   Mentation: Alert oriented to time, place, history taking. Follows all commands speech and language fluent  Cranial nerve II-XII: .Pupils were equal round reactive to light extraocular movements were full, visual field were full on confrontational test. Facial sensation and strength were normal. hearing was intact to finger rubbing bilaterally. Uvula tongue midline. head turning and shoulder shrug were normal and symmetric.Tongue protrusion into cheek strength was normal. Motor: normal bulk and tone, full strength in the BUE, BLE,  No focal weakness Sensory: Mildly decreased light touch and pinprick in the feet, preserved vibratory and proprioception.   Coordination: finger-nose-finger, heel-to-shin bilaterally, no dysmetria Reflexes: Brachioradialis 2/2, biceps 2/2, triceps 2/2, patellar 2/2, Achilles 1/1, plantar responses were flexor bilaterally. Gait and Station: Rising up from seated position without assistance, normal stance,  moderate stride, good arm swing, smooth  turning, able to perform tiptoe, and heel walking without difficulty. Tandem gait is steady  DIAGNOSTIC DATA (LABS, IMAGING, TESTING) -    ASSESSMENT AND PLAN  80 y.o. year old female  has a past medical history of bilateral feet  paresthesias most consistent with small fiber neuropathy. Nerve biopsy was normal. Review results with patient  Continue Gabapentin at current dose, does not need refills Continue daily exercise  F/U in 6 months Dennie Bible, [facility], [facility], APRN  [facility] [address], [place], [place] [address] [phone]

## 2014-04-17 ENCOUNTER — Encounter: Payer: Self-pay | Admitting: Cardiology

## 2014-05-01 ENCOUNTER — Encounter: Payer: Self-pay | Admitting: Cardiology

## 2014-05-11 ENCOUNTER — Other Ambulatory Visit: Payer: Self-pay

## 2014-05-11 DIAGNOSIS — Z1231 Encounter for screening mammogram for malignant neoplasm of breast: Secondary | ICD-10-CM

## 2014-05-25 ENCOUNTER — Ambulatory Visit: Payer: Medicare Other | Admitting: Cardiology

## 2014-06-07 ENCOUNTER — Ambulatory Visit: Payer: Medicare Other | Admitting: Cardiology

## 2014-06-12 ENCOUNTER — Other Ambulatory Visit: Payer: Self-pay | Admitting: Cardiology

## 2014-06-16 ENCOUNTER — Encounter: Payer: Self-pay | Admitting: *Deleted

## 2014-06-19 ENCOUNTER — Encounter: Payer: Self-pay | Admitting: Cardiology

## 2014-06-19 ENCOUNTER — Ambulatory Visit (INDEPENDENT_AMBULATORY_CARE_PROVIDER_SITE_OTHER): Payer: Medicare Other | Admitting: Cardiology

## 2014-06-19 VITALS — BP 126/88 | HR 64 | Ht 67.0 in | Wt 166.0 lb

## 2014-06-19 DIAGNOSIS — Z7901 Long term (current) use of anticoagulants: Secondary | ICD-10-CM | POA: Insufficient documentation

## 2014-06-19 DIAGNOSIS — R000 Tachycardia, unspecified: Secondary | ICD-10-CM | POA: Insufficient documentation

## 2014-06-19 DIAGNOSIS — Z86711 Personal history of pulmonary embolism: Secondary | ICD-10-CM | POA: Insufficient documentation

## 2014-06-19 NOTE — Progress Notes (Signed)
[facility]. [address] [place], [place]  [address] Phone: [phone] Fax:  [phone]  Date:  06/19/2014   ID:  [name], [name] May 04, 1928, MRN [id number]  PCP:  Debbie Evener, MD   History of Present Illness: [name] is a 80 y.o. female with hyperlipidemia, prior TIA in 2009, no longer on Plavix, bilateral PE in 2011 on lifelong Coumadin (Dr. Zadie Orozco), here for follow up. Prior to hip replacement, she had tachycardia and I prescribed her metoprolol succinate ER 25 mg once a day. She has been doing very well with this. Her hip surgery went very well. She is not complaining of any chest pain, shortness of breath.  She does occasionally feel substernal/epigastric burning-like sensation that is relieved with belching. Prior to her PE, was getting out of breath. In March of 2013 she underwent a nuclear stress test that was low risk with no ischemia. EKG personally reviewed previously showed sinus tachycardia rate approximately 100 beats per minute. No ST segment changes.   Wt Readings from Last 3 Encounters:  06/19/14 166 lb (75.297 kg)  03/15/14 172 lb (78.019 kg)  11/14/13 176 lb (79.833 kg)     Past Medical History  Diagnosis Date  . Colonic polyp   . Hyperlipemia   . Peripheral neuropathy   . Personal history of PE (pulmonary embolism) 2011  . Numbness in feet   . 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 Arthritis   . Difficulty sleeping     Past Surgical History  Procedure Laterality Date  . Nephrectomy  1993    RT -   . Cesarean section    . Appendectomy    . Abdominal hysterectomy    . Total hip arthroplasty Right 02/25/2013    Procedure: RIGHT TOTAL HIP ARTHROPLASTY ANTERIOR APPROACH;  Surgeon: Debbie Rossetti, MD;  Location: [facility];  Service: Orthopedics;  Laterality: Right;    Current Outpatient Prescriptions  Medication Sig Dispense Refill  . Calcium-Vitamin D-Vitamin K (VIACTIV) [id number] MG-UNT-MCG CHEW       .  gabapentin (NEURONTIN) 100 MG capsule Take 300 mg by mouth at bedtime. .      . metoprolol succinate (TOPROL-XL) 25 MG 24 hr tablet TAKE 1 TABLET BY MOUTH EVERY MORNING  30 tablet  3  . Multiple Vitamin (MULTIVITAMIN WITH MINERALS) TABS Take 1 tablet by mouth daily.      Marland Kitchen warfarin (COUMADIN) 5 MG tablet Take 5 tablets by mouth daily. Take with 0.5mg  to make 5.5 mg dose       No current facility-administered medications for this visit.    Allergies:    Allergies  Allergen Reactions  . Penicillins Rash    Social History:  The patient  reports that she has never smoked. She has never used smokeless tobacco. She reports that she does not drink alcohol or use illicit drugs.   Family History  Problem Relation Age of Onset  . Hypertension Father   . Diabetes Father   . Hypertension Mother   . Colon cancer Sister     ROS:  Please see the history of present illness.   Denies any syncope, bleeding, orthopnea, PND   All other systems reviewed and negative.   PHYSICAL EXAM: VS:  BP 126/88  Pulse 64  Ht 5\' 7"  (1.702 m)  Wt 166 lb (75.297 kg)  BMI 25.99 kg/m2 Well nourished, well developed, in no acute distress HEENT: normal, [place]/AT, EOMI  Neck: no JVD, normal carotid upstroke, no bruit Cardiac:  normal S1, S2; RRR; no murmur Lungs:  clear to auscultation bilaterally, no wheezing, rhonchi or rales Abd: soft, nontender, no hepatomegaly, no bruits Ext: no edema, 2+ distal pulses Skin: warm and dry GU: deferred Neuro: no focal abnormalities noted, AAO x 3  EKG:  06/19/14-sinus rhythm, nonspecific T-wave changes     ASSESSMENT AND PLAN:  1. Tachycardia-well-controlled with low-dose metoprolol. Reassurance. 2. Chronic anticoagulation-Coumadin, therapeutic, history of PEs. Managed by Dr. Radene Orozco. 3. We'll see back in one year.  Signed, Debbie Furbish, MD [facility]  06/19/2014 11:20 AM

## 2014-06-19 NOTE — Patient Instructions (Signed)
The current medical regimen is effective;  continue present plan and medications.  Follow up in 1 year with Dr Skains.  You will receive a letter in the mail 2 months before you are due.  Please call us when you receive this letter to schedule your follow up appointment.  

## 2014-06-22 ENCOUNTER — Ambulatory Visit
Admission: RE | Admit: 2014-06-22 | Discharge: 2014-06-22 | Disposition: A | Discharge status: Home / Self Care | Payer: Medicare Other | Source: Ambulatory Visit

## 2014-06-22 DIAGNOSIS — Z1231 Encounter for screening mammogram for malignant neoplasm of breast: Secondary | ICD-10-CM

## 2014-06-29 ENCOUNTER — Other Ambulatory Visit: Payer: Self-pay | Admitting: Family Medicine

## 2014-06-29 ENCOUNTER — Encounter: Payer: Self-pay | Admitting: Cardiology

## 2014-06-29 DIAGNOSIS — R928 Other abnormal and inconclusive findings on diagnostic imaging of breast: Secondary | ICD-10-CM

## 2014-07-07 ENCOUNTER — Ambulatory Visit
Admission: RE | Admit: 2014-07-07 | Discharge: 2014-07-07 | Disposition: A | Discharge status: Home / Self Care | Payer: Medicare Other | Source: Ambulatory Visit | Attending: Family Medicine | Admitting: Family Medicine

## 2014-07-07 DIAGNOSIS — R928 Other abnormal and inconclusive findings on diagnostic imaging of breast: Secondary | ICD-10-CM

## 2014-09-06 ENCOUNTER — Encounter: Payer: Self-pay | Admitting: Neurology

## 2014-09-12 ENCOUNTER — Encounter: Payer: Self-pay | Admitting: Neurology

## 2014-09-18 ENCOUNTER — Ambulatory Visit: Payer: Medicare Other | Admitting: Nurse Practitioner

## 2014-09-19 HISTORY — PX: OTHER SURGICAL HISTORY: SHX169

## 2014-09-26 ENCOUNTER — Ambulatory Visit (INDEPENDENT_AMBULATORY_CARE_PROVIDER_SITE_OTHER): Payer: Medicare Other | Admitting: Neurology

## 2014-09-26 ENCOUNTER — Encounter: Payer: Self-pay | Admitting: Neurology

## 2014-09-26 VITALS — BP 123/77 | HR 67 | Ht 67.0 in | Wt 169.0 lb

## 2014-09-26 DIAGNOSIS — G2581 Restless legs syndrome: Secondary | ICD-10-CM

## 2014-09-26 NOTE — Progress Notes (Addendum)
[facility]  PATIENT: [name] DOB: 1928/04/03   REASON FOR VISIT: follow up for neuropathy   HISTORY OF PRESENT ILLNESS:   She has past medical history of bilateral pulmonary emboli, on chronic Coumadin treatment, hyperlipidemia, previous history of severe endometriosis, with total hysterectomy, also right nephrectomy due to erosion from endometriosis   Since 2010, she had gradual onset bilateral feet paresthesia, initial symptoms was left toes, plantar surface, over the past 4 years, her symptoms gradually involved her right foot, occasionally tingling discomfort at the distal leg, she now complains of constant bilateral toes, plantar foot numbness, no pain, no needle prick sensation, no burning, she has mild gait difficulty due to the right hip pain, she denies significant neck pain, no incontinence, no finger paresthesia  EMG/NCS was normal in April 2014.  Nerve biopsy in 11/14/2013 which was essentially normal  Labs, normal B12, RPR, IPEP,  , CMP, anemia, Hg 10.9 in May 2015, She had right hip replacement in May 2014 by Dr. Ninfa Linden, she recovered well,  [facility] better. She still has mild bilateral toes, plantar feet paresthesia, cold.no pain, walking on blocks. She has heel burning, difficulty sleep sometimes.   She does bed exercise,   UPDATE 09/26/2014: Last visit was May 2015  with [name], her symptoms overall has been fairly stable, she continued to has bilateral plantar surface, heel, toes, numbness cold sensation, most noticeable if she sits still for a while, all at nighttime when she tried to go to sleep, she is taking gabapentin 100 mg 3 tablets every night, which has been helpful, if she takes gabapentin during the daytime, she complains of lightheadedness, dizziness, she sleeps well. she denies neck pain, low back pain.,   No bowel and bladder incontinence,  REVIEW OF SYSTEMS: Full 14 system review of systems performed and notable only for those  listed, all others are neg: As above    ALLERGIES: Allergies  Allergen Reactions  . Penicillins Rash    HOME MEDICATIONS: Outpatient Prescriptions Prior to Visit  Medication Sig Dispense Refill  . Calcium-Vitamin D-Vitamin K (VIACTIV) [id number] MG-UNT-MCG CHEW     . gabapentin (NEURONTIN) 100 MG capsule Take 300 mg by mouth at bedtime. .    . metoprolol succinate (TOPROL-XL) 25 MG 24 hr tablet TAKE 1 TABLET BY MOUTH EVERY MORNING 30 tablet 3  . Multiple Vitamin (MULTIVITAMIN WITH MINERALS) TABS Take 1 tablet by mouth daily.    Marland Kitchen warfarin (COUMADIN) 5 MG tablet Take 5 tablets by mouth daily. Take with 0.5mg  to make 5.5 mg dose     No facility-administered medications prior to visit.    PAST MEDICAL HISTORY: Past Medical History  Diagnosis Date  . Colonic polyp   . Hyperlipemia   . Peripheral neuropathy   . Personal history of PE (pulmonary embolism) 2011  . Numbness in feet   . 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 Arthritis   . Difficulty sleeping     PAST SURGICAL HISTORY: Past Surgical History  Procedure Laterality Date  . Nephrectomy  1993    RT -   . Cesarean section    . Appendectomy    . Abdominal hysterectomy    . Total hip arthroplasty Right 02/25/2013    Procedure: RIGHT TOTAL HIP ARTHROPLASTY ANTERIOR APPROACH;  Surgeon: Mcarthur Rossetti, MD;  Location: [facility];  Service: Orthopedics;  Laterality: Right;    FAMILY HISTORY: Family History  Problem Relation Age of Onset  .  Hypertension Father   . Diabetes Father   . Hypertension Mother   . Colon cancer Sister     SOCIAL HISTORY: History   Social History  . Marital Status: Widowed    Spouse Name: N/A    Number of Children: 2  . Years of Education: masters   Occupational History  .      retired   Social History Main Topics  . Smoking status: Never Smoker   . Smokeless tobacco: Never Used  . Alcohol Use: No  . Drug Use: No  . Sexual Activity: Not on file   Other Topics  Concern  . Not on file   Social History Narrative   Patient lives at home alone . - Widow.   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   Caffeine- One cup daily.      PHYSICAL EXAM  Filed Vitals:   09/26/14 1054  BP: 123/77  Pulse: 67  Height: 5\' 7"  (1.702 m)  Weight: 169 lb (76.658 kg)   Body mass index is 26.46 kg/(m^2).  Generalized: Well developed, in no acute distress  Head: normocephalic and atraumatic,. Oropharynx benign  Neck: Supple, no carotid bruits  Cardiac: Regular rate rhythm, no murmur  Musculoskeletal: No deformity   Neurological examination   Mentation: Alert oriented to time, place, history taking. Follows all commands speech and language fluent  Cranial nerve II-XII: .Pupils were equal round reactive to light extraocular movements were full, visual field were full on confrontational test. Facial sensation and strength were normal. hearing was intact to finger rubbing bilaterally. Uvula tongue midline. head turning and shoulder shrug were normal and symmetric.Tongue protrusion into cheek strength was normal. Motor: normal bulk and tone, full strength in the BUE, BLE,  No focal weakness Sensory: Mildly decreased light touch and pinprick in the feet, preserved vibratory and proprioception.   Coordination: finger-nose-finger, heel-to-shin bilaterally, no dysmetria Reflexes: Brachioradialis 2/2, biceps 2/2, triceps 2/2, patellar 2/2, Achilles 1/1, plantar responses were flexor bilaterally. Gait and Station: Rising up from seated position without assistance, cautious, wide-based small stride   DIAGNOSTIC DATA (LABS, IMAGING, TESTING)  ASSESSMENT AND PLAN  80 y.o. year old female  has a past medical history of bilateral feet  Paresthesia, symptoms consistent with restless leg syndrome, EMG nerve conduction study and nerve biopsy was normal, Laboratory showed anemia 10.9 in May 2015, she is on chronic Coumadin, due to previous history of bilateral pulmonary  emboli,   May consider laboratory evaluation, iron panel, ferritin, iron supplement, patient will have her primary care physician follow-up soon, may draw lab together at her yearly checkup.  She has cautious mildly unsteady gait, this could to to combination of aging, hip preplacement, deconditioning,   there could also be a component of superimposed cervical myelopathy, She is still highly function, will hold off further evaluation at this point, Continue gabapentin 100 mg 3 tablets every night Return to clinic in 6 months with Rhae Hammock, M.D. Ph.D. [facility] [address], [place], [place] [address] [phone]

## 2014-10-10 ENCOUNTER — Other Ambulatory Visit: Payer: Self-pay | Admitting: Cardiology

## 2014-10-27 ENCOUNTER — Other Ambulatory Visit: Payer: Self-pay | Admitting: Neurology

## 2015-04-03 ENCOUNTER — Ambulatory Visit (INDEPENDENT_AMBULATORY_CARE_PROVIDER_SITE_OTHER): Payer: Medicare Other | Admitting: Nurse Practitioner

## 2015-04-03 ENCOUNTER — Encounter: Payer: Self-pay | Admitting: Nurse Practitioner

## 2015-04-03 VITALS — BP 116/74 | HR 71 | Ht 68.0 in | Wt 167.5 lb

## 2015-04-03 DIAGNOSIS — Z86711 Personal history of pulmonary embolism: Secondary | ICD-10-CM

## 2015-04-03 DIAGNOSIS — M169 Osteoarthritis of hip, unspecified: Secondary | ICD-10-CM | POA: Diagnosis not present

## 2015-04-03 DIAGNOSIS — G609 Hereditary and idiopathic neuropathy, unspecified: Secondary | ICD-10-CM

## 2015-04-03 MED ORDER — GABAPENTIN 100 MG PO CAPS: ORAL_CAPSULE | ORAL | Status: DC

## 2015-04-03 NOTE — Progress Notes (Signed)
[facility]  PATIENT: [name] DOB: 21-Aug-1928   REASON FOR VISIT: Follow-up for neuropathy HISTORY FROM: Patient    HISTORY OF PRESENT ILLNESS:  HISTORY: She has past medical history of bilateral pulmonary emboli, on chronic Coumadin treatment, hyperlipidemia, previous history of severe endometriosis, with total hysterectomy, also right nephrectomy due to erosion from endometriosis  Since 2010, she had gradual onset bilateral feet paresthesia, initial symptoms was left toes, plantar surface, over the past 4 years, her symptoms gradually involved her right foot, occasionally tingling discomfort at the distal leg, she now complains of constant bilateral toes, plantar foot numbness, no pain, no needle prick sensation, no burning, she has mild gait difficulty due to the right hip pain, she denies significant neck pain, no incontinence, no finger paresthesia  EMG/NCS was normal in April 2014.  Nerve biopsy in 11/14/2013 which was essentially normal Labs, normal B12, RPR, IPEP, , CMP, anemia, Hg 10.9 in May 2015, She had right hip replacement in May 2014 by Dr. Ninfa Linden, she recovered well, [facility] better. She still has mild bilateral toes, plantar feet paresthesia, cold.no pain, walking on blocks. She has heel burning, difficulty sleep sometimes. She does bed exercise,   UPDATE 04/03/2015: Ms. Racine, 81 year old female returns for follow-up. She has a history of neuropathy since 2010. Her symptoms are fairly well controlled on gabapentin which she takes at bedtime. She has tried taking it during the day with drowsiness. Her symptoms overall has been fairly stable. She continued to has bilateral plantar surface, heel, toes, numbness cold sensation, most noticeable if she sits still for a while, or at nighttime when she tried to go to sleep, she is taking gabapentin 100 mg 3 tablets every night, which has been helpful. She complains of lightheadedness, dizziness,when  taking gabapentin during the day but she has never tried 100 mg. She denies neck pain, low back pain. She remains very active doing volunteer work for [employer], playing bridge etc. No bowel and bladder incontinence. She returns for reevaluation    REVIEW OF SYSTEMS: Full 14 system review of systems performed and notable only for those listed, all others are neg:  Constitutional: neg  Cardiovascular: neg Ear/Nose/Throat: neg  Skin: neg Eyes: neg Respiratory: neg Gastroitestinal: neg  Hematology/Lymphatic: neg  Endocrine: neg Musculoskeletal:neg Allergy/Immunology: neg Neurological: Numbness in the feet  Psychiatric: neg Sleep : neg   ALLERGIES: Allergies  Allergen Reactions  . Penicillins Rash    HOME MEDICATIONS: Outpatient Prescriptions Prior to Visit  Medication Sig Dispense Refill  . Calcium-Vitamin D-Vitamin K (VIACTIV) [phone] MG-UNT-MCG CHEW     . gabapentin (NEURONTIN) 100 MG capsule Take 300 mg by mouth at bedtime. .    . metoprolol succinate (TOPROL-XL) 25 MG 24 hr tablet take 1 tablet by mouth every morning 30 tablet 6  . Multiple Vitamin (MULTIVITAMIN WITH MINERALS) TABS Take 1 tablet by mouth daily.    Marland Kitchen gabapentin (NEURONTIN) 100 MG capsule TAKE 1 TO 3 CAPSULES BY MOUTH AT BEDTIME 90 capsule 6  . warfarin (COUMADIN) 5 MG tablet Take 5 tablets by mouth daily. Take with 0.'5mg'$  to make 5.5 mg dose     No facility-administered medications prior to visit.    PAST MEDICAL HISTORY: Past Medical History  Diagnosis Date  . Colonic polyp   . Hyperlipemia   . Peripheral neuropathy   . Personal history of PE (pulmonary embolism) 2011  . Numbness in feet   . 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  Arthritis   . Difficulty sleeping     PAST SURGICAL HISTORY: Past Surgical History  Procedure Laterality Date  . Nephrectomy  1993    RT -   . Cesarean section    . Appendectomy    . Abdominal hysterectomy    . Total hip arthroplasty Right 02/25/2013     Procedure: RIGHT TOTAL HIP ARTHROPLASTY ANTERIOR APPROACH;  Surgeon: Mcarthur Rossetti, MD;  Location: [facility];  Service: Orthopedics;  Laterality: Right;  . Cataracts Bilateral 09/2014    removal    FAMILY HISTORY: Family History  Problem Relation Age of Onset  . Hypertension Father   . Diabetes Father   . Hypertension Mother   . Colon cancer Sister     SOCIAL HISTORY: History   Social History  . Marital Status: Widowed    Spouse Name: N/A  . Number of Children: 2  . Years of Education: masters   Occupational History  .      retired   Social History Main Topics  . Smoking status: Never Smoker   . Smokeless tobacco: Never Used  . Alcohol Use: No  . Drug Use: No  . Sexual Activity: Not on file   Other Topics Concern  . Not on file   Social History Narrative   Patient lives at home alone . - Widow.   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   Caffeine- One cup daily.      PHYSICAL EXAM  Filed Vitals:   04/03/15 0930  BP: 116/74  Pulse: 71  Height: '5\' 8"'$  (1.727 m)  Weight: 167 lb 8 oz (75.978 kg)   Body mass index is 25.47 kg/(m^2). Generalized: Well developed, in no acute distress  Head: normocephalic and atraumatic,. Oropharynx benign  Neck: Supple, no carotid bruits  Cardiac: Regular rate rhythm, no murmur  Musculoskeletal: No deformity   Neurological examination   Mentation: Alert oriented to time, place, history taking. Follows all commands speech and language fluent  Cranial nerve II-XII: .Pupils were equal round reactive to light extraocular movements were full, visual field were full on confrontational test. Facial sensation and strength were normal. hearing was intact to finger rubbing bilaterally. Uvula tongue midline. head turning and shoulder shrug were normal and symmetric.Tongue protrusion into cheek strength was normal. Motor: normal bulk and tone, full strength in the BUE, BLE, No focal weakness Sensory: Mildly decreased  light touch and pinprick in the feet, preserved vibratory and proprioception.  Coordination: finger-nose-finger, heel-to-shin bilaterally, no dysmetria Reflexes: Brachioradialis 2/2, biceps 2/2, triceps 2/2, patellar 2/2, Achilles 1/1, plantar responses were flexor bilaterally. Gait and Station: Rising up from seated position without assistance, cautious, wide-based small stride , tandem gait is unsteady, no assistive device DIAGNOSTIC DATA (LABS, IMAGING, TESTING) -   ASSESSMENT AND PLAN  81 y.o. year old female  has a past medical history of bilateral feet paresthesias, symptoms consistent with restless leg syndrome. EMG nerve conduction study and nerve biopsy were normal in the past. She has anemia and is on chronic Coumadin therapy due to previous history of bilateral pulmonary emboli. She has cautious mildly unsteady gait which could be a combination of her age,  hip replacement deconditioning. She remains highly functional  Try 100 mg of gabapentin in the morning and continue 300 mg at night Refill medication Continue being as active as possible Follow-up in 6-8 months Dennie Bible, [facility], [facility], [facility] [address] [place], [place] [address] [phone])  [phone]  

## 2015-04-03 NOTE — Patient Instructions (Signed)
Try 100 mg of gabapentin in the morning and continue 300 mg at night Refill medication Continue being as active as possible Follow-up in 6-8 months

## 2015-04-05 NOTE — Progress Notes (Signed)
I have reviewed and agreed above plan. 

## 2015-04-29 ENCOUNTER — Other Ambulatory Visit: Payer: Self-pay | Admitting: Cardiology

## 2015-06-19 ENCOUNTER — Encounter: Payer: Self-pay | Admitting: Cardiology

## 2015-06-19 ENCOUNTER — Ambulatory Visit (INDEPENDENT_AMBULATORY_CARE_PROVIDER_SITE_OTHER): Payer: Medicare Other | Admitting: Cardiology

## 2015-06-19 VITALS — BP 122/80 | HR 83 | Ht 68.0 in | Wt 165.0 lb

## 2015-06-19 DIAGNOSIS — R000 Tachycardia, unspecified: Secondary | ICD-10-CM | POA: Diagnosis not present

## 2015-06-19 DIAGNOSIS — Z7901 Long term (current) use of anticoagulants: Secondary | ICD-10-CM | POA: Diagnosis not present

## 2015-06-19 DIAGNOSIS — Z86711 Personal history of pulmonary embolism: Secondary | ICD-10-CM

## 2015-06-19 MED ORDER — METOPROLOL SUCCINATE ER 25 MG PO TB24: 12.5000 mg | ORAL_TABLET | Freq: Every day | ORAL | Status: DC

## 2015-06-19 NOTE — Progress Notes (Signed)
[facility] [address], [place], [place]  [address] Phone: [phone] Fax:  [phone]  Date:  06/19/2015   ID:  Debbie, Orozco 1928/09/04, MRN [id number]  PCP:  Milagros Evener, MD   History of Present Illness: [name] is a 81 y.o. female with hyperlipidemia, prior TIA in 2009, no longer on Plavix, bilateral PE in 2011 on lifelong Coumadin (Dr. Zadie Rhine), here for follow up. Prior to hip replacement, she had tachycardia and I prescribed her metoprolol succinate ER 25 mg once a day. She has been doing very well with this. Her hip surgery went very well. She is not complaining of any chest pain, shortness of breath.  Been having neck issues. Since warfarin increased been feeling discomfort.  Prior to her PE, was getting out of breath. In March of 2013 she underwent a nuclear stress test that was low risk with no ischemia. EKG personally reviewed previously showed sinus tachycardia rate approximately 100 beats per minute. No ST segment changes.  Overall no CP, no SOB, feels like a teen ager since new hip.    Wt Readings from Last 3 Encounters:  06/19/15 165 lb (74.844 kg)  04/03/15 167 lb 8 oz (75.978 kg)  09/26/14 169 lb (76.658 kg)     Past Medical History  Diagnosis Date  . Colonic polyp   . Hyperlipemia   . Peripheral neuropathy   . Personal history of PE (pulmonary embolism) 2011  . Numbness in feet   . 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 Arthritis   . Difficulty sleeping     Past Surgical History  Procedure Laterality Date  . Nephrectomy  1993    RT -   . Cesarean section    . Appendectomy    . Abdominal hysterectomy    . Total hip arthroplasty Right 02/25/2013    Procedure: RIGHT TOTAL HIP ARTHROPLASTY ANTERIOR APPROACH;  Surgeon: Mcarthur Rossetti, MD;  Location: [facility];  Service: Orthopedics;  Laterality: Right;  . Cataracts Bilateral 09/2014    removal    Current Outpatient Prescriptions  Medication Sig Dispense  Refill  . gabapentin (NEURONTIN) 100 MG capsule TAKE 1 TABLET BY MOUTH IN THE AM & 3 TABS BY MOUTH IN THE PM    . metoprolol succinate (TOPROL-XL) 25 MG 24 hr tablet Take 12.5 mg by mouth daily.    . Multiple Vitamin (MULTIVITAMIN WITH MINERALS) TABS Take 1 tablet by mouth daily.    Marland Kitchen warfarin (COUMADIN) 1 MG tablet Takes 5.'5mg'$  po daily until 04-04-15, will get INR check 04-04-15.  0   No current facility-administered medications for this visit.    Allergies:    Allergies  Allergen Reactions  . Penicillins Rash    Social History:  The patient  reports that she has never smoked. She has never used smokeless tobacco. She reports that she does not drink alcohol or use illicit drugs.   Family History  Problem Relation Age of Onset  . Hypertension Father   . Diabetes Father   . Hypertension Mother   . Colon cancer Sister     ROS:  Please see the history of present illness.   Denies any syncope, bleeding, orthopnea, PND   All other systems reviewed and negative.   PHYSICAL EXAM: VS:  BP 122/80 mmHg  Pulse 83  Ht '5\' 8"'$  (1.727 m)  Wt 165 lb (74.844 kg)  BMI 25.09 kg/m2 Well nourished, well developed, in  no acute distress HEENT: normal, [place]/AT, EOMI Neck: no JVD, normal carotid upstroke, no bruit Cardiac:  normal S1, S2; RRR; no murmur Lungs:  clear to auscultation bilaterally, no wheezing, rhonchi or rales Abd: soft, nontender, no hepatomegaly, no bruits Ext: no edema, 2+ distal pulses Skin: warm and dry GU: deferred Neuro: no focal abnormalities noted, AAO x 3  EKG:  06/19/14-sinus rhythm, nonspecific T-wave changes     ASSESSMENT AND PLAN:  1. Tachycardia-well-controlled with low-dose metoprolol. Reduced dose due to hypotension.  Doing well. Reassurance. 2. Chronic anticoagulation-Coumadin, therapeutic, history of PEs. Managed by Dr. Radene Ou. 3. Neck pain - improved with massage. Working with Dr. Radene Ou. 4. We'll see back in one year.  Signed, Candee Furbish, MD [facility]    06/19/2015 8:06 AM

## 2015-06-19 NOTE — Patient Instructions (Signed)
Medication Instructions:  Your physician recommends that you continue on your current medications as directed. Please refer to the Current Medication list given to you today.  Follow-Up: Follow up in 1 year with Dr. Skains.  You will receive a letter in the mail 2 months before you are due.  Please call us when you receive this letter to schedule your follow up appointment.  Thank you for choosing  [facility]!!       

## 2015-06-20 ENCOUNTER — Ambulatory Visit
Admission: RE | Admit: 2015-06-20 | Discharge: 2015-06-20 | Disposition: A | Discharge status: Home / Self Care | Payer: Medicare Other | Source: Ambulatory Visit | Attending: Family Medicine | Admitting: Family Medicine

## 2015-06-20 ENCOUNTER — Other Ambulatory Visit: Payer: Self-pay | Admitting: Family Medicine

## 2015-06-20 DIAGNOSIS — M542 Cervicalgia: Secondary | ICD-10-CM

## 2015-07-31 ENCOUNTER — Ambulatory Visit: Payer: Medicare Other | Attending: Orthopaedic Surgery | Admitting: Physical Therapy

## 2015-07-31 DIAGNOSIS — M542 Cervicalgia: Secondary | ICD-10-CM | POA: Diagnosis present

## 2015-07-31 DIAGNOSIS — R2991 Unspecified symptoms and signs involving the musculoskeletal system: Secondary | ICD-10-CM | POA: Insufficient documentation

## 2015-07-31 DIAGNOSIS — M436 Torticollis: Secondary | ICD-10-CM | POA: Diagnosis present

## 2015-07-31 DIAGNOSIS — G4485 Primary stabbing headache: Secondary | ICD-10-CM | POA: Diagnosis present

## 2015-07-31 DIAGNOSIS — Z7409 Other reduced mobility: Secondary | ICD-10-CM

## 2015-07-31 NOTE — Therapy (Signed)
[place], [place], [address] Phone: [phone]   Fax:  [phone]  Physical Therapy Evaluation  Patient Details  Name: [name] MRN: [id number] Date of Birth: 03-Jul-1928 Referring Provider:  Mcarthur Rossetti*  Encounter Date: 07/31/2015      PT End of Session - 07/31/15 1228    Visit Number 1   Number of Visits 16   Date for PT Re-Evaluation 09/25/15   PT Start Time 1103   PT Stop Time 1156   PT Time Calculation (min) 53 min   Activity Tolerance Patient tolerated treatment well   Behavior During Therapy [facility] for tasks assessed/performed      Past Medical History  Diagnosis Date  . Colonic polyp   . Hyperlipemia   . Peripheral neuropathy   . Personal history of PE (pulmonary embolism) 2011  . Numbness in feet   . 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 Arthritis   . Difficulty sleeping     Past Surgical History  Procedure Laterality Date  . Nephrectomy  1993    RT -   . Cesarean section    . Appendectomy    . Abdominal hysterectomy    . Total hip arthroplasty Right 02/25/2013    Procedure: RIGHT TOTAL HIP ARTHROPLASTY ANTERIOR APPROACH;  Surgeon: Mcarthur Rossetti, MD;  Location: [facility];  Service: Orthopedics;  Laterality: Right;  . Cataracts Bilateral 09/2014    removal    There were no vitals filed for this visit.  Visit Diagnosis:  Pain in neck  Stiffness of cervical spine  Impaired transfers  Primary stabbing headache      Subjective Assessment - 07/31/15 1109    Subjective Patient presents with now chronic neck pain (Aug 2016) which began without trauma or injury.  She c/o difficulty turning head, new onset of gradual head pain in base of skull, occ sharp shooting pains.  He has to wear a neck brace when she sleeps, esp turning onto her side and getting out of bed.     Pertinent History change of meds just prior to onset of sx., neuropathy, Rt.  THR, history of PE   Limitations Other (comment);House hold activities;Lifting;Reading   Diagnostic tests Mild progression of degenerative change at C4-5, XR done in Aug 2016.    Patient Stated Goals pt would like to be able to tunr her head normally, get rid of pain   Currently in Pain? Yes  only when she moves her head   Pain Score 7    Pain Location Head   Pain Orientation Posterior   Pain Descriptors / Indicators Sharp;Shooting;Tightness   Pain Type Chronic pain   Pain Onset More than a month ago   Pain Frequency Intermittent   Aggravating Factors  turning head, sleeping   Pain Relieving Factors being upright, heat and wearing neck brace   Effect of Pain on Daily Activities moves very cautiously and with stiffness, has not exercised   Multiple Pain Sites No            [facility] PT Assessment - 07/31/15 1118    Assessment   Medical Diagnosis cervicalgia   Precautions   Precautions None   Balance Screen   Has the patient fallen in the past 6 months No   Has the patient had a decrease in activity level because of a fear of falling?  No   Is the patient reluctant to leave their home  because of a fear of falling?  No   Prior Function   Level of Independence Independent   Leisure bridge, used to exercise all the time, but has not    Cognition   Overall Cognitive Status Within Functional Limits for tasks assessed   Sensation   Light Touch Appears Intact   Posture/Postural Control   Postural Limitations Forward head   Posture Comments low Rt. shoulder    AROM   Cervical Flexion 20   Cervical Extension 27   Cervical - Right Side Bend 20   Cervical - Left Side Bend 15  sits with neck tipped to Rt.    Cervical - Right Rotation 40   Cervical - Left Rotation 35                   [facility] Adult PT Treatment/Exercise - 07/31/15 1118    Self-Care   ADL's support head    Posture head neck alignment   Heat/Ice Application moist heat, frequency   Shoulder Exercises:  Supine   Other Supine Exercises chin tuckj/retraction x 10 with pain    Moist Heat Therapy   Number Minutes Moist Heat 15 Minutes   Moist Heat Location Cervical                PT Education - 07/31/15 1227    Education provided Yes   Education Details PT/POC, HEP, posture, neck pillow and positioning   Person(s) Educated Patient   Methods Explanation   Comprehension Verbalized understanding;Returned demonstration;Need further instruction          PT Short Term Goals - 07/31/15 1235    PT SHORT TERM GOAL #1   Title Patient will be I with initial HEP    Time 4   Period Weeks   Status New   PT SHORT TERM GOAL #2   Title Pt will understand posture and body mechanics in order to decrease neck strain, especially with bed mobilty   Time 4   Period Weeks   Status New   PT SHORT TERM GOAL #3   Title Pt will resume walking program as able without pain increase in neck   Time 4   Period Weeks   Status New   PT SHORT TERM GOAL #4   Title Pt will report ability to turn head with less shooting pain into head   Time 4   Period Weeks   Status New           PT Long Term Goals - 07/31/15 1237    PT LONG TERM GOAL #1   Title Pt wil be I with more advanced HEP as of last visit   Time 8   Period Weeks   Status New   PT LONG TERM GOAL #2   Title Pt will be able to rotate C-spine to 50 deg or more with min pain in neck   Time 8   Period Weeks   Status New   PT LONG TERM GOAL #3   Title Pt will be able to get in and out of bed without increase in neck pain    Time 8   Period Weeks   Status New   PT LONG TERM GOAL #4   Title Pt will be able to resume full exercise plan with no increase in neck pain    Time 8   Period Weeks   Status New   PT LONG TERM GOAL #5   Title Pt will report no  increased neck pain while reading   Time 8   Period Weeks   Status New               Plan - August 05, 2015 1229    Clinical Impression Statement Patient presents with  limitations in functional mobility due to cervical and head pain, stiffness. Contributing factors include using her neck brace several hours a day, stopping her exercise program and degenerative process in cervical spine.  She responded well to heat but continues to have pain in neck which sends shooting type pain into her head.    Pt will benefit from skilled therapeutic intervention in order to improve on the following deficits Decreased knowledge of precautions;Decreased strength;Increased muscle spasms;Postural dysfunction;Improper body mechanics;Impaired flexibility;Decreased mobility;Decreased activity tolerance;Impaired sensation;Pain;Hypomobility;Impaired UE functional use;Increased fascial restricitons   Rehab Potential Good   PT Frequency 2x / week   PT Duration 8 weeks   PT Treatment/Interventions Functional mobility training;Cryotherapy;Therapeutic activities;Manual techniques;Electrical Stimulation;Therapeutic exercise;Moist Heat;Patient/family education;Taping;Dry needling;Passive range of motion;Neuromuscular re-education   PT Next Visit Plan manual, C-ROM, chin tuck and self care for positioning, sleeping, repeat MHP   PT Home Exercise Plan OA nod    Consulted and Agree with Plan of Care Patient          G-Codes - 2015/08/05 1253    Functional Assessment Tool Used FOTO   Functional Limitation Changing and maintaining body position   Changing and Maintaining Body Position Current Status (K8127) At least 60 percent but less than 80 percent impaired, limited or restricted   Changing and Maintaining Body Position Goal Status (N1700) At least 40 percent but less than 60 percent impaired, limited or restricted       Problem List Patient Active Problem List   Diagnosis Date Noted  . Tachycardia 06/19/2014  . Chronic anticoagulation 06/19/2014  . History of pulmonary embolism 06/19/2014  . Degenerative arthritis of hip 02/25/2013  . Hereditary and idiopathic peripheral neuropathy  01/05/2013  . Hip pain 01/05/2013    Nealy Karapetian 08/05/2015, 1:04 PM  [facility] [address] [place], [place], [address] Phone: [phone]   Fax:  [phone]  Raeford Razor, PT 05-Aug-2015 1:05 PM Phone: [phone] Fax: [phone]

## 2015-07-31 NOTE — Patient Instructions (Addendum)
Sleeping on Back   Place pillow under knees. A pillow with cervical support and a roll around waist are also helpful. Copyright  VHI. All rights reserved.  Sleeping on Side Place pillow between knees. Use cervical support under neck and a roll around waist as needed. Copyright  VHI. All rights reserved.   Sleeping on Stomach   If this is the only desirable sleeping position, place pillow under lower legs, and under stomach or chest as needed.  Posture - Sitting   Sit upright, head facing forward. Try using a roll to support lower back. Keep shoulders relaxed, and avoid rounded back. Keep hips level with knees. Avoid crossing legs for long periods. Stand to Sit / Sit to Stand   To sit: Bend knees to lower self onto front edge of chair, then scoot back on seat. To stand: Reverse sequence by placing one foot forward, and scoot to front of seat. Use rocking motion to stand up.   Work Height and Reach  Ideal work height is no more than 2 to 4 inches below elbow level when standing, and at elbow level when sitting. Reaching should be limited to arm's length, with elbows slightly bent.  Bending  Bend at hips and knees, not back. Keep feet shoulder-width apart.    Posture - Standing   Good posture is important. Avoid slouching and forward head thrust. Maintain curve in low back and align ears over shoul- ders, hips over ankles.  Alternating Positions   Alternate tasks and change positions frequently to reduce fatigue and muscle tension. Take rest breaks. Computer Work   Position work to Programmer, multimedia. Use proper work and seat height. Keep shoulders back and down, wrists straight, and elbows at right angles. Use chair that provides full back support. Add footrest and lumbar roll as needed.  Getting Into / Out of Car  Lower self onto seat, scoot back, then bring in one leg at a time. Reverse sequence to get out.  Dressing  Lie on back to pull socks or slacks over feet, or sit  and bend leg while keeping back straight.    Housework - Sink  Place one foot on ledge of cabinet under sink when standing at sink for prolonged periods.   Pushing / Pulling  Pushing is preferable to pulling. Keep back in proper alignment, and use leg muscles to do the work.  Deep Squat   Squat and lift with both arms held against upper trunk. Tighten stomach muscles without holding breath. Use smooth movements to avoid jerking.  Avoid Twisting   Avoid twisting or bending back. Pivot around using foot movements, and bend at knees if needed when reaching for articles.  Carrying Luggage   Distribute weight evenly on both sides. Use a cart whenever possible. Do not twist trunk. Move body as a unit.   Lifting Principles .Maintain proper posture and head alignment. .Slide object as close as possible before lifting. .Move obstacles out of the way. .Test before lifting; ask for help if too heavy. .Tighten stomach muscles without holding breath. .Use smooth movements; do not jerk. .Use legs to do the work, and pivot with feet. .Distribute the work load symmetrically and close to the center of trunk. .Push instead of pull whenever possible.   Ask For Help   Ask for help and delegate to others when possible. Coordinate your movements when lifting together, and maintain the low back curve.  Log Roll   Lying on back, bend left knee and place  left arm across chest. Roll all in one movement to the right. Reverse to roll to the left. Always move as one unit. Housework - Sweeping  Use long-handled equipment to avoid stooping.   Housework - Wiping  Position yourself as close as possible to reach work surface. Avoid straining your back.  Laundry - Unloading Wash   To unload small items at bottom of washer, lift leg opposite to arm being used to reach.  [place] close to area to be raked. Use arm movements to do the work. Keep back straight and avoid  twisting.     Cart  When reaching into cart with one arm, lift opposite leg to keep back straight.   Getting Into / Out of Bed  Lower self to lie down on one side by raising legs and lowering head at the same time. Use arms to assist moving without twisting. Bend both knees to roll onto back if desired. To sit up, start from lying on side, and use same move-ments in reverse. Housework - Vacuuming  Hold the vacuum with arm held at side. Step back and forth to move it, keeping head up. Avoid twisting.   Laundry - IT consultant so that bending and twisting can be avoided.   Laundry - Unloading Dryer  Squat down to reach into clothes dryer or use a reacher.  Gardening - Weeding / Probation officer or Kneel. Knee pads may be helpful.                  Head Press With Howe chin SLIGHTLY toward chest, keep mouth closed. Feel weight on back of head. Increase weight by pressing head down. Hold _5-10__ seconds. Relax. Repeat _10__ times. Surface: floor   Copyright  VHI. All rights reserved.

## 2015-08-07 ENCOUNTER — Ambulatory Visit: Payer: Medicare Other | Admitting: Physical Therapy

## 2015-08-07 DIAGNOSIS — Z7409 Other reduced mobility: Secondary | ICD-10-CM

## 2015-08-07 DIAGNOSIS — G4485 Primary stabbing headache: Secondary | ICD-10-CM

## 2015-08-07 DIAGNOSIS — M436 Torticollis: Secondary | ICD-10-CM

## 2015-08-07 DIAGNOSIS — M542 Cervicalgia: Secondary | ICD-10-CM

## 2015-08-07 DIAGNOSIS — R2991 Unspecified symptoms and signs involving the musculoskeletal system: Secondary | ICD-10-CM

## 2015-08-07 NOTE — Patient Instructions (Addendum)
Stretch Break - Neck Rotation    Turn head slowly to look over left shoulder. Return to starting position. Then turn to look over right shoulder. Repeat _3-4___ times every __6-8__ hours.  Copyright  VHI. All rights reserved.   Flexibility: Upper Trapezius Stretch   Gently grasp right side of head while reaching behind back with other hand. Tilt head away until a gentle stretch is felt. Hold _30_ seconds. Repeat __3__ times per set. Do _1___ sets per session. Do _2_ sessions per day.  http://orth.exer.us/340    http://[email or web address].exer.us/30   Scapular Retraction (Standing)   With arms at sides, pinch shoulder blades together.  Can also do in bed/lying down. Repeat _10___ times per set. Do __1__ sets per session. Do __2__ sessions per day.  http://orth.exer.us/944   Flexibility: Neck Retraction   Pull head straight back, keeping eyes and jaw level. Repeat ___10_ times per set. Do _1___ sets per session. Do __1-2__ sessions per day.  http://orth.exer.us/344   Posture - Sitting   Sit upright, head facing forward. Try using a roll to support lower back. Keep shoulders relaxed, and avoid rounded back. Keep hips level with knees. Avoid crossing legs for long periods.   http://orth.exer.us/342   Copyright  VHI. All rights reserved.  AROM: Neck Extension   CLASP HANDS BEHIND NECK AND LIFT CHIN TOWARDS CEILING Bend head backward  Hold __5-10__ seconds. STOP IF YOU GET DIZZY Repeat _5___ times per set. Do __1__ sets per session. Do __1__ sessions per day.  http://orth.exer.us/301   Copyright  VHI. All rights reserved.

## 2015-08-07 NOTE — Therapy (Signed)
[place], [place], [address] Phone: [phone]   Fax:  [phone]  Physical Therapy Treatment  Patient Details  Name: [name] MRN: [id number] Date of Birth: 1927/11/22 No Data Recorded  Encounter Date: 08/07/2015      PT End of Session - 08/07/15 1038    Visit Number 2   Number of Visits 16   Date for PT Re-Evaluation 09/25/15   PT Start Time 0928   PT Stop Time 1035   PT Time Calculation (min) 67 min   Activity Tolerance Patient tolerated treatment well   Behavior During Therapy [facility] for tasks assessed/performed      Past Medical History  Diagnosis Date  . Colonic polyp   . Hyperlipemia   . Peripheral neuropathy   . Personal history of PE (pulmonary embolism) 2011  . Numbness in feet   . 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 Arthritis   . Difficulty sleeping     Past Surgical History  Procedure Laterality Date  . Nephrectomy  1993    RT -   . Cesarean section    . Appendectomy    . Abdominal hysterectomy    . Total hip arthroplasty Right 02/25/2013    Procedure: RIGHT TOTAL HIP ARTHROPLASTY ANTERIOR APPROACH;  Surgeon: Mcarthur Rossetti, MD;  Location: [facility];  Service: Orthopedics;  Laterality: Right;  . Cataracts Bilateral 09/2014    removal    There were no vitals filed for this visit.  Visit Diagnosis:  Pain in neck  Stiffness of cervical spine  Primary stabbing headache  Impaired transfers      Subjective Assessment - 08/07/15 0942    Subjective No pain today, I only hurt after sleeping (morning or middle of the night, after a nap).  Still hasnt got a new pillow.    Currently in Pain? No/denies          [facility] Adult PT Treatment/Exercise - 08/07/15 0943    Shoulder Exercises: Supine   Other Supine Exercises chin tuckj/retraction x 10 with pain    Shoulder Exercises: Seated   Other Seated Exercises chin tuck with corrected posture   Shoulder  Exercises: ROM/Strengthening   UBE (Upper Arm Bike) 5 min, level 1 corrected posture   Moist Heat Therapy   Number Minutes Moist Heat 15 Minutes   Moist Heat Location Cervical   Manual Therapy   Manual Therapy Joint mobilization;Soft tissue mobilization;Myofascial release   Manual therapy comments gentle suboccipital release, PRom into rotation   Joint Mobilization C spine gentle lateral glides and P/A Gr. 1-2 along  mid to upper cervical    Soft tissue mobilization post cervicals, suboccipitals, upper trap   Myofascial Release into scalp   Neck Exercises: Stretches   Upper Trapezius Stretch 2 reps;30 seconds   Upper Trapezius Stretch Limitations used hand on top of head    Other Neck Stretches AROM rotation    Other Neck Stretches extension hands behind head            PT Education - 08/07/15 1038    Education provided Yes   Education Details HEP, posture, relaxing head, neck, sleeping position, pillow    Person(s) Educated Patient   Methods Explanation;Demonstration;Handout   Comprehension Returned demonstration;Verbalized understanding          PT Short Term Goals - 08/07/15 1040    PT SHORT TERM GOAL #1   Title Patient will be I with  initial HEP    Status On-going   PT SHORT TERM GOAL #2   Title Pt will understand posture and body mechanics in order to decrease neck strain, especially with bed mobilty   Status On-going   PT SHORT TERM GOAL #3   Title Pt will resume walking program as able without pain increase in neck   Status On-going   PT SHORT TERM GOAL #4   Title Pt will report ability to turn head with less shooting pain into head   Status On-going           PT Long Term Goals - 08/07/15 1041    PT LONG TERM GOAL #1   Title Pt wil be I with more advanced HEP as of last visit   Status On-going   PT LONG TERM GOAL #2   Title Pt will be able to rotate C-spine to 50 deg or more with min pain in neck   Status On-going   PT LONG TERM GOAL #3   Title Pt  will be able to get in and out of bed without increase in neck pain    Status On-going   PT LONG TERM GOAL #4   Title Pt will be able to resume full exercise plan with no increase in neck pain    Status On-going   PT LONG TERM GOAL #5   Title Pt will report no increased neck pain while reading   Status On-going               Plan - 08/07/15 1039    Clinical Impression Statement Patient with pain during palpation (L side of occipitalridge) and soft tissue work, able to say she felt better after explanation and treatment.  No goals met, 2nd visit. She was given info on a new orthopedic neck pillow, recommended she resume biking or walking in her fitness center and listen to her body with respect to pain.     PT Next Visit Plan manual, C-ROM, chin tuck and self care for positioning, sleeping, repeat MHP   PT Home Exercise Plan OA nod and other posture (retract, C AROM)   Consulted and Agree with Plan of Care Patient        Problem List Patient Active Problem List   Diagnosis Date Noted  . Tachycardia 06/19/2014  . Chronic anticoagulation 06/19/2014  . History of pulmonary embolism 06/19/2014  . Degenerative arthritis of hip 02/25/2013  . Hereditary and idiopathic peripheral neuropathy 01/05/2013  . Hip pain 01/05/2013    Makya Yurko 08/07/2015, 12:01 PM  [facility] [address] [place], [place], [address] Phone: [phone]   Fax:  [phone]  Name: LYNETTE TOPETE MRN: [id number] Date of Birth: 1928/08/20    Raeford Razor, PT 08/07/2015 12:01 PM Phone: [phone] Fax: [phone]

## 2015-08-09 ENCOUNTER — Ambulatory Visit: Payer: Medicare Other | Admitting: Physical Therapy

## 2015-08-09 DIAGNOSIS — Z7409 Other reduced mobility: Secondary | ICD-10-CM

## 2015-08-09 DIAGNOSIS — M436 Torticollis: Secondary | ICD-10-CM

## 2015-08-09 DIAGNOSIS — M542 Cervicalgia: Secondary | ICD-10-CM | POA: Diagnosis not present

## 2015-08-09 DIAGNOSIS — G4485 Primary stabbing headache: Secondary | ICD-10-CM

## 2015-08-09 DIAGNOSIS — R2991 Unspecified symptoms and signs involving the musculoskeletal system: Secondary | ICD-10-CM

## 2015-08-09 NOTE — Therapy (Signed)
[place], [place], [address] Phone: [phone]   Fax:  [phone]  Physical Therapy Treatment  Patient Details  Name: [name] MRN: [id number] Date of Birth: [birth year] No Data Recorded  Encounter Date: 08/09/2015      PT End of Session - 08/09/15 1029    Visit Number 3   Number of Visits 16   Date for PT Re-Evaluation 09/25/15   PT Start Time 1884   PT Stop Time 1111   PT Time Calculation (min) 53 min      Past Medical History  Diagnosis Date  . Colonic polyp   . Hyperlipemia   . Peripheral neuropathy   . Personal history of PE (pulmonary embolism) 2011  . Numbness in feet   . 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 Arthritis   . Difficulty sleeping     Past Surgical History  Procedure Laterality Date  . Nephrectomy  1993    RT -   . Cesarean section    . Appendectomy    . Abdominal hysterectomy    . Total hip arthroplasty Right 02/25/2013    Procedure: RIGHT TOTAL HIP ARTHROPLASTY ANTERIOR APPROACH;  Surgeon: Mcarthur Rossetti, MD;  Location: [facility];  Service: Orthopedics;  Laterality: Right;  . Cataracts Bilateral 09/2014    removal    There were no vitals filed for this visit.  Visit Diagnosis:  Pain in neck  Stiffness of cervical spine  Primary stabbing headache  Impaired transfers      Subjective Assessment - 08/09/15 1022    Subjective I think I slept wrong. My neck is getting better. I got the neck pillow that was recommended.    Currently in Pain? No/denies   Pain Descriptors / Indicators Sore                         [facility] Adult PT Treatment/Exercise - 08/09/15 1022    Shoulder Exercises: Supine   Other Supine Exercises chin tuckj/retraction x 10 with pain    Other Supine Exercises shoulder press x 10   Shoulder Exercises: Seated   Other Seated Exercises chin tuck with corrected posture   Other Seated Exercises scap  squeezes x 10   Shoulder Exercises: ROM/Strengthening   UBE (Upper Arm Bike) 5 min, level 1 corrected posture   Moist Heat Therapy   Moist Heat Location Cervical   Manual Therapy   Manual therapy comments gentle suboccipital release, PRom into rotation   Soft tissue mobilization post cervicals, suboccipitals, upper trap   Myofascial Release into scalp   Neck Exercises: Stretches   Upper Trapezius Stretch 2 reps;30 seconds   Upper Trapezius Stretch Limitations used hand on top of head    Levator Stretch 2 reps;20 seconds                  PT Short Term Goals - 08/07/15 1040    PT SHORT TERM GOAL #1   Title Patient will be I with initial HEP    Status On-going   PT SHORT TERM GOAL #2   Title Pt will understand posture and body mechanics in order to decrease neck strain, especially with bed mobilty   Status On-going   PT SHORT TERM GOAL #3   Title Pt will resume walking program as able without pain increase in neck   Status On-going   PT SHORT TERM GOAL #4  Title Pt will report ability to turn head with less shooting pain into head   Status On-going           PT Long Term Goals - 08/07/15 1041    PT LONG TERM GOAL #1   Title Pt wil be I with more advanced HEP as of last visit   Status On-going   PT LONG TERM GOAL #2   Title Pt will be able to rotate C-spine to 50 deg or more with min pain in neck   Status On-going   PT LONG TERM GOAL #3   Title Pt will be able to get in and out of bed without increase in neck pain    Status On-going   PT LONG TERM GOAL #4   Title Pt will be able to resume full exercise plan with no increase in neck pain    Status On-going   PT LONG TERM GOAL #5   Title Pt will report no increased neck pain while reading   Status On-going               Plan - 08/09/15 1034    Clinical Impression Statement  She has pain in posterior neck with leaning forward and rising from chair. Instructed patient in gentle supine an seated scap  /cervical stabilization. Reviewed stretches with pt requiring min cues. Manual for tenderness and tighness as well as PROM. HMP repeated.    PT Next Visit Plan manual, C-ROM, chin tuck and self care for positioning, sleeping, repeat MHP        Problem List Patient Active Problem List   Diagnosis Date Noted  . Tachycardia 06/19/2014  . Chronic anticoagulation 06/19/2014  . History of pulmonary embolism 06/19/2014  . Degenerative arthritis of hip 02/25/2013  . Hereditary and idiopathic peripheral neuropathy 01/05/2013  . Hip pain 01/05/2013    Dorene Ar, PTA 08/09/2015, 11:01 AM  [place], [place], [address] Phone: [phone]   Fax:  [phone]  Name: [name] MRN: [id number] Date of Birth: Dec 12, 1927

## 2015-08-14 ENCOUNTER — Ambulatory Visit: Payer: Medicare Other | Admitting: Physical Therapy

## 2015-08-14 DIAGNOSIS — G4485 Primary stabbing headache: Secondary | ICD-10-CM

## 2015-08-14 DIAGNOSIS — M542 Cervicalgia: Secondary | ICD-10-CM | POA: Diagnosis not present

## 2015-08-14 DIAGNOSIS — M436 Torticollis: Secondary | ICD-10-CM

## 2015-08-14 DIAGNOSIS — R2991 Unspecified symptoms and signs involving the musculoskeletal system: Secondary | ICD-10-CM

## 2015-08-14 DIAGNOSIS — Z7409 Other reduced mobility: Secondary | ICD-10-CM

## 2015-08-14 NOTE — Therapy (Signed)
[name], [place], [address] Phone: [phone]   Fax:  [phone]  Physical Therapy Treatment  Patient Details  Name: [name] MRN: [id number] Date of Birth: 05-May-1928 No Data Recorded  Encounter Date: 08/14/2015      PT End of Session - 08/14/15 1252    Visit Number 4   Number of Visits 16   Date for PT Re-Evaluation 09/25/15   PT Start Time 0930   PT Stop Time 1030   PT Time Calculation (min) 60 min   Activity Tolerance Patient tolerated treatment well   Behavior During Therapy [facility] for tasks assessed/performed      Past Medical History  Diagnosis Date  . Colonic polyp   . Hyperlipemia   . Peripheral neuropathy   . Personal history of PE (pulmonary embolism) 2011  . Numbness in feet   . 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 Arthritis   . Difficulty sleeping     Past Surgical History  Procedure Laterality Date  . Nephrectomy  1993    RT -   . Cesarean section    . Appendectomy    . Abdominal hysterectomy    . Total hip arthroplasty Right 02/25/2013    Procedure: RIGHT TOTAL HIP ARTHROPLASTY ANTERIOR APPROACH;  Surgeon: Mcarthur Rossetti, MD;  Location: [facility];  Service: Orthopedics;  Laterality: Right;  . Cataracts Bilateral 09/2014    removal    There were no vitals filed for this visit.  Visit Diagnosis:  Pain in neck  Stiffness of cervical spine  Primary stabbing headache  Impaired transfers      Subjective Assessment - 08/14/15 0935    Subjective Its getting better, less sharp shooting pain into head.     Currently in Pain? Yes   Pain Score 5   premedicated   Pain Location Head   Pain Orientation Lower;Posterior   Pain Descriptors / Indicators Sore  shoots up into my head   Pain Type Chronic pain   Pain Onset More than a month ago   Pain Frequency Intermittent   Aggravating Factors  sleeping on back, looking down   Pain Relieving Factors  Tylenol, gentle movement               [facility] Adult PT Treatment/Exercise - 08/14/15 0942    Neck Exercises: Theraband   Scapula Retraction 5 reps   Rows 20 reps   Horizontal ADduction 5 reps   Horizontal ABduction 10 reps;Other (comment)  2 sets one standing 1 supine   Horizontal ABduction Limitations yellow   Neck Exercises: Supine   Neck Retraction 10 reps;5 secs   Neck Retraction Limitations done with sl rotation x 5 each side    Shoulder Flexion 5 reps  2 sets 1 standing 1 supine   Shoulder Flexion Weights (lbs) with magic circle   Moist Heat Therapy   Moist Heat Location Cervical   Manual Therapy   Manual therapy comments gentle suboccipital release, PROM into rotation   Soft tissue mobilization post cervicals, suboccipitals, upper trap   Myofascial Release into scalp                PT Education - 08/14/15 1252    Education provided Yes   Education Details band ex HEP   Person(s) Educated Patient   Methods Explanation   Comprehension Verbalized understanding          PT Short Term Goals -  08/14/15 1255    PT SHORT TERM GOAL #1   Title Patient will be I with initial HEP    Status Achieved   PT SHORT TERM GOAL #2   Title Pt will understand posture and body mechanics in order to decrease neck strain, especially with bed mobilty   Status Achieved   PT SHORT TERM GOAL #3   Title Pt will resume walking program as able without pain increase in neck   Status On-going   PT SHORT TERM GOAL #4   Title Pt will report ability to turn head with less shooting pain into head   Status Achieved           PT Long Term Goals - 08/14/15 1255    PT LONG TERM GOAL #1   Title Pt wil be I with more advanced HEP as of last visit   Status On-going   PT LONG TERM GOAL #2   Title Pt will be able to rotate C-spine to 50 deg or more with min pain in neck   Status Unable to assess   PT LONG TERM GOAL #3   Title Pt will be able to get in and out of bed without  increase in neck pain    Status Partially Met   PT LONG TERM GOAL #4   Title Pt will be able to resume full exercise plan with no increase in neck pain    Status On-going   PT LONG TERM GOAL #5   Title Pt will report no increased neck pain while reading   Status On-going               Plan - 08/14/15 1253    Clinical Impression Statement Patient is improving and is able to do her HEP without pain increase.  She has difficulty relaxing during manual, especially tight along Rt occipital ridge.  Responds well to heat.    PT Next Visit Plan manual, C-ROM, chin tuck and self care for positioning, sleeping, repeat MHP, re check AROM   PT Home Exercise Plan OA nod and other posture (retract, C AROM), row and horiz abd yellow    Consulted and Agree with Plan of Care Patient        Problem List Patient Active Problem List   Diagnosis Date Noted  . Tachycardia 06/19/2014  . Chronic anticoagulation 06/19/2014  . History of pulmonary embolism 06/19/2014  . Degenerative arthritis of hip 02/25/2013  . Hereditary and idiopathic peripheral neuropathy 01/05/2013  . Hip pain 01/05/2013    Denette Hass 08/14/2015, 12:57 PM  [facility] [address] [place], [place], [address] Phone: [phone]   Fax:  [phone]  Name: [name] MRN: [id number] Date of Birth: 1928/09/20  Raeford Razor, PT 08/14/2015 12:58 PM Phone: [phone] Fax: [phone]

## 2015-08-14 NOTE — Patient Instructions (Signed)
Low Row: Thumbs Up    Face anchor, medium to wide stance. Thumbs up, pull arms back, squeezing shoulder blades together.  Repeat 10-20__ times per set. Do __1-2 sets per session. Do _5_ sessions per week. Anchor Height: Waist  http://tub.exer.us/68   Copyright  VHI. All rights reserved.  Resisted Horizontal Abduction: Bilateral    Sit or stand, tubing in both hands, arms out in front. Keeping arms straight, pinch shoulder blades together and stretch arms out. Repeat _10-20___ times per set. Do ___1-2_ sets per session. Do ___1_ sessions per day.  http://orth.exer.us/969   Copyright  VHI. All rights reserved.

## 2015-08-16 ENCOUNTER — Ambulatory Visit: Payer: Medicare Other | Admitting: Physical Therapy

## 2015-08-16 DIAGNOSIS — M436 Torticollis: Secondary | ICD-10-CM

## 2015-08-16 DIAGNOSIS — G4485 Primary stabbing headache: Secondary | ICD-10-CM

## 2015-08-16 DIAGNOSIS — M542 Cervicalgia: Secondary | ICD-10-CM | POA: Diagnosis not present

## 2015-08-16 NOTE — Therapy (Signed)
[place], [place], [address] Phone: [phone]   Fax:  [phone]  Physical Therapy Treatment  Patient Details  Name: [name] MRN: [id number] Date of Birth: Mar 08, 1928 No Data Recorded  Encounter Date: 08/16/2015      PT End of Session - 08/16/15 1248    Visit Number 5   Number of Visits 16   Date for PT Re-Evaluation 09/25/15   PT Start Time 1018   PT Stop Time 1114   PT Time Calculation (min) 56 min   Activity Tolerance Patient tolerated treatment well   Behavior During Therapy [facility] for tasks assessed/performed      Past Medical History  Diagnosis Date  . Colonic polyp   . Hyperlipemia   . Peripheral neuropathy   . Personal history of PE (pulmonary embolism) 2011  . Numbness in feet   . 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 Arthritis   . Difficulty sleeping     Past Surgical History  Procedure Laterality Date  . Nephrectomy  1993    RT -   . Cesarean section    . Appendectomy    . Abdominal hysterectomy    . Total hip arthroplasty Right 02/25/2013    Procedure: RIGHT TOTAL HIP ARTHROPLASTY ANTERIOR APPROACH;  Surgeon: Mcarthur Rossetti, MD;  Location: [facility];  Service: Orthopedics;  Laterality: Right;  . Cataracts Bilateral 09/2014    removal    There were no vitals filed for this visit.  Visit Diagnosis:  Pain in neck  Stiffness of cervical spine  Primary stabbing headache      Subjective Assessment - 08/16/15 1025    Subjective Feeling better. 3/10. felt better after last session. Been doing the exercise plan but having trouble securing the band in the door.                         [facility] Adult PT Treatment/Exercise - 08/16/15 0001    Neck Exercises: Theraband   Scapula Retraction 5 reps   Shoulder Extension 20 reps;Other (comment)  yellow for all ther ex   Shoulder Extension Limitations with physioball behind back and against  wall for posture   Rows 20 reps   Horizontal ABduction 10 reps;Other (comment)  2 sets one standing 1 supine   Horizontal ABduction Limitations yellow with physioball behing back to maintain posture.   Neck Exercises: Supine   Neck Retraction 10 reps;5 secs   Neck Retraction Limitations done with sl rotation x 5 each side    Shoulder Flexion 5 reps;10 reps  2 sets 1 standing 1 supine   Shoulder Flexion Weights (lbs) 2#: with chin tuck   Lumbar Exercises: Aerobic   UBE (Upper Arm Bike) L1 forward 3 min backward 3 min   Shoulder Exercises: Supine   Shoulder Flexion Weight (lbs) 2# 2sets 10reps   Other Supine Exercises chin tuck/retraction x 10 with pain    Moist Heat Therapy   Number Minutes Moist Heat 15 Minutes   Moist Heat Location Cervical   Manual Therapy   Manual therapy comments gentle suboccipital release, PROM into rotation   Soft tissue mobilization post cervicals, suboccipitals, upper trap   Myofascial Release into scalp                  PT Short Term Goals - 08/14/15 1255    PT SHORT TERM GOAL #1   Title Patient  will be I with initial HEP    Status Achieved   PT SHORT TERM GOAL #2   Title Pt will understand posture and body mechanics in order to decrease neck strain, especially with bed mobilty   Status Achieved   PT SHORT TERM GOAL #3   Title Pt will resume walking program as able without pain increase in neck   Status On-going   PT SHORT TERM GOAL #4   Title Pt will report ability to turn head with less shooting pain into head   Status Achieved           PT Long Term Goals - 08/14/15 1255    PT LONG TERM GOAL #1   Title Pt wil be I with more advanced HEP as of last visit   Status On-going   PT LONG TERM GOAL #2   Title Pt will be able to rotate C-spine to 50 deg or more with min pain in neck   Status Unable to assess   PT LONG TERM GOAL #3   Title Pt will be able to get in and out of bed without increase in neck pain    Status Partially Met    PT LONG TERM GOAL #4   Title Pt will be able to resume full exercise plan with no increase in neck pain    Status On-going   PT LONG TERM GOAL #5   Title Pt will report no increased neck pain while reading   Status On-going               Plan - 08/16/15 1251    Clinical Impression Statement Patient is doing much better and is reporting no pain upon arrival or after treatment. She still presents with ROM deficits and stiffness in the cervical region, and is continuing to have trouble relaxing during manual therapy. Working on strengthening and stretching to progress towards goals.   PT Next Visit Plan manual, C-ROM, strengthening, repeat MHP, check AROM for progress towards goals        Problem List Patient Active Problem List   Diagnosis Date Noted  . Tachycardia 06/19/2014  . Chronic anticoagulation 06/19/2014  . History of pulmonary embolism 06/19/2014  . Degenerative arthritis of hip 02/25/2013  . Hereditary and idiopathic peripheral neuropathy 01/05/2013  . Hip pain 01/05/2013   Laury Axon, [place] 08/16/2015 12:58 PM PHONE:[phone] [facility] [place], [place], [address] Phone: [phone]   Fax:  [phone]  Name: [name] MRN: [id number] Date of Birth: 08-29-1928

## 2015-08-21 ENCOUNTER — Ambulatory Visit: Payer: Medicare Other | Attending: Orthopaedic Surgery | Admitting: Physical Therapy

## 2015-08-21 DIAGNOSIS — M542 Cervicalgia: Secondary | ICD-10-CM | POA: Insufficient documentation

## 2015-08-21 DIAGNOSIS — M436 Torticollis: Secondary | ICD-10-CM | POA: Insufficient documentation

## 2015-08-21 DIAGNOSIS — G4485 Primary stabbing headache: Secondary | ICD-10-CM | POA: Diagnosis present

## 2015-08-21 DIAGNOSIS — R2991 Unspecified symptoms and signs involving the musculoskeletal system: Secondary | ICD-10-CM | POA: Insufficient documentation

## 2015-08-21 DIAGNOSIS — Z7409 Other reduced mobility: Secondary | ICD-10-CM

## 2015-08-21 NOTE — Therapy (Signed)
[place], [place], [address] Phone: [phone]   Fax:  [phone]  Physical Therapy Treatment  Patient Details  Name: [name] MRN: [id number] Date of Birth: December 22, 1927 No Data Recorded  Encounter Date: 08/21/2015      PT End of Session - 08/21/15 1149    Visit Number 6   Number of Visits 16   Date for PT Re-Evaluation 09/25/15   PT Start Time 0930   PT Stop Time 1015   PT Time Calculation (min) 45 min   Activity Tolerance Patient tolerated treatment well   Behavior During Therapy [facility] for tasks assessed/performed      Past Medical History  Diagnosis Date  . Colonic polyp   . Hyperlipemia   . Peripheral neuropathy   . Personal history of PE (pulmonary embolism) 2011  . Numbness in feet   . 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 Arthritis   . Difficulty sleeping     Past Surgical History  Procedure Laterality Date  . Nephrectomy  1993    RT -   . Cesarean section    . Appendectomy    . Abdominal hysterectomy    . Total hip arthroplasty Right 02/25/2013    Procedure: RIGHT TOTAL HIP ARTHROPLASTY ANTERIOR APPROACH;  Surgeon: Mcarthur Rossetti, MD;  Location: [facility];  Service: Orthopedics;  Laterality: Right;  . Cataracts Bilateral 09/2014    removal    There were no vitals filed for this visit.  Visit Diagnosis:  Pain in neck  Stiffness of cervical spine  Primary stabbing headache  Impaired transfers      Subjective Assessment - 08/21/15 0942    Subjective Pain in neck only a little bit.  Pt has a raised rash            [facility] PT Assessment - 08/21/15 0945    AROM   Cervical Flexion 25   Cervical Extension 40   Cervical - Right Side Bend 30   Cervical - Left Side Bend 35   Cervical - Right Rotation 53   Cervical - Left Rotation 60                     [facility] Adult PT Treatment/Exercise - 08/21/15 0949    Neck Exercises: Theraband   Shoulder Extension 20 reps;Red   Rows 10 reps;Red;Other (comment)   Rows Limitations high row x 10, low row x 10    Horizontal ABduction 10 reps;Red   Other Theraband Exercises diagonal pull x 10 red ( difficult)   Neck Exercises: Sidelying   Other Sidelying Exercise book openings for T spine and cervical rotation x 4 each    Shoulder Exercises: Standing   Other Standing Exercises rolled ball up wall to encourage thoracic extension x 6   Shoulder Exercises: ROM/Strengthening   UBE (Upper Arm Bike) level 1, 5 min with corrected posture   Moist Heat Therapy   Number Minutes Moist Heat --  deferred due to skin    Manual Therapy   Manual Therapy Passive ROM   Manual therapy comments gentle suboccipital release, PROM into rotation  stretch lateral flexion and flexion, used sheet as barrier   Neck Exercises: Stretches   Other Neck Stretches assisted rotation with towel (snag)                  PT Short Term Goals - 08/21/15 1151    PT  SHORT TERM GOAL #1   Title Patient will be I with initial HEP    Status Achieved   PT SHORT TERM GOAL #2   Title Pt will understand posture and body mechanics in order to decrease neck strain, especially with bed mobilty   Status Achieved   PT SHORT TERM GOAL #3   Title Pt will resume walking program as able without pain increase in neck   Status On-going   PT SHORT TERM GOAL #4   Title Pt will report ability to turn head with less shooting pain into head   Status Achieved           PT Long Term Goals - 08/21/15 1151    PT LONG TERM GOAL #1   Title Pt wil be I with more advanced HEP as of last visit   Status On-going   PT LONG TERM GOAL #2   Title Pt will be able to rotate C-spine to 50 deg or more with min pain in neck   Status Achieved   PT LONG TERM GOAL #3   Title Pt will be able to get in and out of bed without increase in neck pain    Status Achieved   PT LONG TERM GOAL #4   Title Pt will be able to resume full exercise plan  with no increase in neck pain    Status On-going   PT LONG TERM GOAL #5   Title Pt will report no increased neck pain while reading   Status On-going               Plan - 08/21/15 1149    Clinical Impression Statement Pt with increased AROM of cervical spine all planes.  Worked today on maintaining head in good alignment during functional UE acitvity.     PT Next Visit Plan manual, C-ROM, strengthening, repeat MHP, check AROM for progress towards goals   PT Home Exercise Plan OA nod and other posture (retract, C AROM), row and horiz abd yellow , gave rot with towel to incr AROM   Consulted and Agree with Plan of Care Patient        Problem List Patient Active Problem List   Diagnosis Date Noted  . Tachycardia 06/19/2014  . Chronic anticoagulation 06/19/2014  . History of pulmonary embolism 06/19/2014  . Degenerative arthritis of hip 02/25/2013  . Hereditary and idiopathic peripheral neuropathy 01/05/2013  . Hip pain 01/05/2013    [name] 08/21/2015, 12:00 PM  [facility] [address] [place], [place], [address] Phone: [phone]   Fax:  [phone]  Name: [name] MRN: [id number] Date of Birth: [birth year] [name], PT 08/21/2015 12:01 PM Phone: [phone] Fax: [phone]

## 2015-08-23 ENCOUNTER — Ambulatory Visit: Payer: Medicare Other | Admitting: Physical Therapy

## 2015-08-23 DIAGNOSIS — M436 Torticollis: Secondary | ICD-10-CM

## 2015-08-23 DIAGNOSIS — M542 Cervicalgia: Secondary | ICD-10-CM

## 2015-08-23 NOTE — Therapy (Addendum)
[place], [place], [address] Phone: [phone]   Fax:  [phone]  Physical Therapy Treatment  Patient Details  Name: [name] MRN: [id number] Date of Birth: 1928/10/16 No Data Recorded  Encounter Date: 08/23/2015      PT End of Session - 08/23/15 0936    Visit Number 6   Number of Visits 16   Date for PT Re-Evaluation 09/25/15   PT Start Time 0930   PT Stop Time 1015   PT Time Calculation (min) 45 min   Activity Tolerance Patient tolerated treatment well   Behavior During Therapy [facility] for tasks assessed/performed      Past Medical History  Diagnosis Date  . Colonic polyp   . Hyperlipemia   . Peripheral neuropathy   . Personal history of PE (pulmonary embolism) 2011  . Numbness in feet   . 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 Arthritis   . Difficulty sleeping     Past Surgical History  Procedure Laterality Date  . Nephrectomy  1993    RT -   . Cesarean section    . Appendectomy    . Abdominal hysterectomy    . Total hip arthroplasty Right 02/25/2013    Procedure: RIGHT TOTAL HIP ARTHROPLASTY ANTERIOR APPROACH;  Surgeon: Mcarthur Rossetti, MD;  Location: [facility];  Service: Orthopedics;  Laterality: Right;  . Cataracts Bilateral 09/2014    removal    There were no vitals filed for this visit.  Visit Diagnosis:  No diagnosis found.      Subjective Assessment - 08/23/15 0934    Subjective "Im doing my exercises right. I still have problems problems with the back of my head and neck, especiall when I lay down. I'm going to the doctor today."                         [facility] Adult PT Treatment/Exercise - 08/23/15 1341    Neck Exercises: Theraband   Shoulder Extension 20 reps;Red   Rows 10 reps;Red;Other (comment)   Rows Limitations high row x 10, low row x 10    Horizontal ABduction 10 reps;Red   Other Theraband Exercises diagonal pull x 10 red (  difficult)   Neck Exercises: Supine   Neck Retraction 10 reps;5 secs   Neck Retraction Limitations done with sl rotation x 5 each side    Shoulder Flexion 5 reps;10 reps  2 sets 1 standing 1 supine   Shoulder Flexion Weights (lbs) 2#: with chin tuck   Lumbar Exercises: Aerobic   Tread Mill 1.0 miles an hour for 5 minutes  cues for even gait as her step length was decreased on the L   UBE (Upper Arm Bike) L1 forward 3 min backward 3 min   Shoulder Exercises: Standing   Other Standing Exercises rolled ball up wall to encourage thoracic extension x 6   Shoulder Exercises: ROM/Strengthening   UBE (Upper Arm Bike) level 1, 5 min with corrected posture   Manual Therapy   Manual therapy comments gentle suboccipital release, PROM into rotation  with sheet and gloves because of rash on neck   Soft tissue mobilization post cervicals, suboccipitals, upper trap   Myofascial Release into scalp                  PT Short Term Goals - 08/21/15 1151    PT SHORT TERM GOAL #1  Title Patient will be I with initial HEP    Status Achieved   PT SHORT TERM GOAL #2   Title Pt will understand posture and body mechanics in order to decrease neck strain, especially with bed mobilty   Status Achieved   PT SHORT TERM GOAL #3   Title Pt will resume walking program as able without pain increase in neck   Status On-going   PT SHORT TERM GOAL #4   Title Pt will report ability to turn head with less shooting pain into head   Status Achieved           PT Long Term Goals - 08/23/15 1346    PT LONG TERM GOAL #1   Title Pt wil be I with more advanced HEP as of last visit   Time 8   Period Weeks   Status On-going   PT LONG TERM GOAL #2   Title Pt will be able to rotate C-spine to 50 deg or more with min pain in neck   Time 8   Period Weeks   Status Achieved   PT LONG TERM GOAL #3   Title Pt will be able to get in and out of bed without increase in neck pain    Time 8   Period Weeks    Status Achieved   PT LONG TERM GOAL #4   Title Pt will be able to resume full exercise plan with no increase in neck pain    Time 8   Period Weeks   Status On-going   PT LONG TERM GOAL #5   Title Pt will report no increased neck pain while reading   Time 8   Status Achieved               Plan - 08/23/15 1000    Clinical Impression Statement Patient is progressing and has achieved long term goal number 5 with no neck pain while reading. She is [facility] and see how she responds to her old exercise program. She is concerned about her balance and would like to address it in future visits. Also, discussed dry needling with patient and gave handout. She has been scheduled for DN next visit.    PT Next Visit Plan DN next visit, then BERG in future visits- pt would like HEP for balance, continueneck ROM, strengthening.        During this treatment session, the therapist was present, participating in and directing the treatment.  Problem List Patient Active Problem List   Diagnosis Date Noted  . Tachycardia 06/19/2014  . Chronic anticoagulation 06/19/2014  . History of pulmonary embolism 06/19/2014  . Degenerative arthritis of hip 02/25/2013  . Hereditary and idiopathic peripheral neuropathy 01/05/2013  . Hip pain 01/05/2013   Laury Axon, SPTA 08/23/2015 2:47 PM PHONE:[phone] [phone]  Hessie Diener, PTA 08/23/2015 2:47 PM Phone: [phone] Fax: [facility] [address] [place], [place], [address] Phone: [phone]   Fax:  [phone]  Name: [name] MRN: [id number] Date of Birth: Aug 07, 1928

## 2015-08-28 ENCOUNTER — Ambulatory Visit: Payer: Medicare Other | Admitting: Physical Therapy

## 2015-08-28 ENCOUNTER — Other Ambulatory Visit: Payer: Self-pay

## 2015-08-28 DIAGNOSIS — Z1231 Encounter for screening mammogram for malignant neoplasm of breast: Secondary | ICD-10-CM

## 2015-08-28 DIAGNOSIS — M542 Cervicalgia: Secondary | ICD-10-CM | POA: Diagnosis not present

## 2015-08-28 DIAGNOSIS — G4485 Primary stabbing headache: Secondary | ICD-10-CM

## 2015-08-28 DIAGNOSIS — M436 Torticollis: Secondary | ICD-10-CM

## 2015-08-28 NOTE — Patient Instructions (Signed)
Practice balancing on one leg or one foot in front of the other.  Do next to the counter for safety with as a little hand support as you can use safely.  Over Head Pull: Narrow Grip       On back, knees bent, feet flat, band across thighs, elbows straight but relaxed. Pull hands apart (start). Keeping elbows straight, bring arms up and over head, hands toward floor. Keep pull steady on band. Hold momentarily. Return slowly, keeping pull steady, back to start. Repeat 8___ times. Band color __yellow____   Side Pull: Double Arm   On back, knees bent, feet flat. Arms perpendicular to body, shoulder level, elbows straight but relaxed. Pull arms out to sides, elbows straight. Resistance band comes across collarbones, hands toward floor. Hold momentarily. Slowly return to starting position. Repeat __8_ times. Band color __yellow___   Sash   On back, knees bent, feet flat, left hand on left hip, right hand above left. Pull right arm DIAGONALLY (hip to shoulder) across chest. Bring right arm along head toward floor. Hold momentarily. Slowly return to starting position. Repeat __8_ times. Do with left arm. Band color ___yellow___   Shoulder Rotation: Double Arm   On back, knees bent, feet flat, elbows tucked at sides, bent 90, hands palms up. Pull hands apart and down toward floor, keeping elbows near sides. Hold momentarily. Slowly return to starting position. Repeat _8__ times. Band color ____yellow__

## 2015-08-28 NOTE — Therapy (Signed)
[place], [place], [address] Phone: [phone]   Fax:  [phone]  Physical Therapy Treatment  Patient Details  Name: [name] MRN: [id number] Date of Birth: 05-26-1928 No Data Recorded  Encounter Date: 08/28/2015      PT End of Session - 08/28/15 1237    Visit Number 7   Number of Visits 16   Date for PT Re-Evaluation 09/25/15   Authorization Type [employer] Medicare   PT Start Time 1324   PT Stop Time 4010   PT Time Calculation (min) 57 min   Activity Tolerance Patient tolerated treatment well      Past Medical History  Diagnosis Date  . Colonic polyp   . Hyperlipemia   . Peripheral neuropathy   . Personal history of PE (pulmonary embolism) 2011  . Numbness in feet   . 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 Arthritis   . Difficulty sleeping     Past Surgical History  Procedure Laterality Date  . Nephrectomy  1993    RT -   . Cesarean section    . Appendectomy    . Abdominal hysterectomy    . Total hip arthroplasty Right 02/25/2013    Procedure: RIGHT TOTAL HIP ARTHROPLASTY ANTERIOR APPROACH;  Surgeon: Mcarthur Rossetti, MD;  Location: [facility];  Service: Orthopedics;  Laterality: Right;  . Cataracts Bilateral 09/2014    removal    There were no vitals filed for this visit.  Visit Diagnosis:  Pain in neck  Stiffness of cervical spine  Primary stabbing headache      Subjective Assessment - 08/28/15 1149    Subjective Patient states her doctors (Dr. Ninfa Linden and Dr. Sandi Mariscal) told her not to do the dry needling "because it had not been researched enough."  Has not been to the [facility] yet.  Sleeping better.   Pain with turning her head and flexion is painful but overall it is 75% better.     Currently in Pain? Yes   Pain Score 2    Pain Location Neck   Pain Orientation Mid;Upper;Medial   Pain Type Chronic pain   Pain Onset More than a month ago   Pain  Frequency Intermittent   Aggravating Factors  looking down and turning head   Pain Relieving Factors Alleve;  towel assisted rotation and all exercises; massage and heat            [facility] PT Assessment - 08/28/15 1156    Berg Balance Test   Sit to Stand Able to stand without using hands and stabilize independently   Standing Unsupported Able to stand safely 2 minutes   Sitting with Back Unsupported but Feet Supported on Floor or Stool Able to sit safely and securely 2 minutes   Stand to Sit Sits safely with minimal use of hands   Transfers Able to transfer safely, minor use of hands   Standing Unsupported with Eyes Closed Able to stand 10 seconds safely   Standing Ubsupported with Feet Together Able to place feet together independently and stand 1 minute safely   From Standing, Reach Forward with Outstretched Arm Can reach forward >12 cm safely (5")   From Standing Position, Pick up Object from Floor Able to pick up shoe safely and easily   From Standing Position, Turn to Look Behind Over each Shoulder Looks behind from both sides and weight shifts well   Turn 360 Degrees Able to  turn 360 degrees safely one side only in 4 seconds or less   Standing Unsupported, Alternately Place Feet on Step/Stool Able to stand independently and complete 8 steps >20 seconds   Standing Unsupported, One Foot in Front Able to plae foot ahead of the other independently and hold 30 seconds   Standing on One Leg Able to lift leg independently and hold equal to or more than 3 seconds   Total Score 50                     [facility] Adult PT Treatment/Exercise - 08/28/15 0001    Neck Exercises: Supine   Other Supine Exercise supine scap stabilization series 10x each:  overhead narrow and wide, HABD, ER,  sash   Moist Heat Therapy   Number Minutes Moist Heat 10 Minutes   Moist Heat Location Cervical   Manual Therapy   Manual Therapy Soft tissue mobilization;Manual Traction   Soft tissue mobilization  upper trap passive stretch 3x 30 sec upper trap min overpressure   Myofascial Release suboccipital release 3x 30sec   Manual Traction gentle 3x 30 sec                PT Education - 08/28/15 1236    Education provided Yes   Education Details Single leg and narrow base standing next to counter;  supine yellow band scapular stab series   Person(s) Educated Patient   Methods Explanation;Demonstration;Handout   Comprehension Verbalized understanding;Returned demonstration          PT Short Term Goals - 08/28/15 1248    PT SHORT TERM GOAL #1   Title Patient will be I with initial HEP    Status Achieved   PT SHORT TERM GOAL #2   Title Pt will understand posture and body mechanics in order to decrease neck strain, especially with bed mobilty   Status Achieved   PT SHORT TERM GOAL #3   Title Pt will resume walking program as able without pain increase in neck   Time 4   Period Weeks   Status On-going   PT SHORT TERM GOAL #4   Title Pt will report ability to turn head with less shooting pain into head   Status Achieved           PT Long Term Goals - 08/28/15 1250    PT LONG TERM GOAL #1   Title Pt wil be I with more advanced HEP as of last visit   Time 8   Period Weeks   Status On-going   PT LONG TERM GOAL #2   Title Pt will be able to rotate C-spine to 50 deg or more with min pain in neck   Status Achieved   PT LONG TERM GOAL #3   Title Pt will be able to get in and out of bed without increase in neck pain    Status Achieved   PT LONG TERM GOAL #4   Title Pt will be able to resume full exercise plan with no increase in neck pain    Time 8   Period Weeks   Status On-going   PT LONG TERM GOAL #5   Title Pt will report no increased neck pain while reading   Status Achieved               Plan - 08/28/15 1238    Clinical Impression Statement The patient reports good overall progress with PT with current pain 2/10.   She  declines dry needling at this  time.   BERG balance score 50/56 indicating low risk of falls.  Deficits with narrow base of support activities and single leg.  She is able to perform band exercises in supine for scapular stabilization without exacerbation of pain.  Her skin rash has improved and checked by her doctor.   Therapist closely monitoring response throughout treatment session.     PT Next Visit Plan patient deferred dry needling;  single leg and narrow base of support balance;  neck ROM especially rotation, manual techniques, moist heat        Problem List Patient Active Problem List   Diagnosis Date Noted  . Tachycardia 06/19/2014  . Chronic anticoagulation 06/19/2014  . History of pulmonary embolism 06/19/2014  . Degenerative arthritis of hip 02/25/2013  . Hereditary and idiopathic peripheral neuropathy 01/05/2013  . Hip pain 01/05/2013    Alvera Singh 08/28/2015, 1:03 PM  [facility] [address] [place], [place], [address] Phone: [phone]   Fax:  [phone]  Name: KEAH LAMBA MRN: [id number] Date of Birth: 03-Jan-1928   Ruben Im, PT 08/28/2015 1:03 PM Phone: [phone] Fax: [phone]

## 2015-08-30 ENCOUNTER — Ambulatory Visit: Payer: Medicare Other | Admitting: Physical Therapy

## 2015-08-30 DIAGNOSIS — M436 Torticollis: Secondary | ICD-10-CM

## 2015-08-30 DIAGNOSIS — M542 Cervicalgia: Secondary | ICD-10-CM

## 2015-08-30 DIAGNOSIS — G4485 Primary stabbing headache: Secondary | ICD-10-CM

## 2015-08-30 NOTE — Therapy (Addendum)
[place], [place], [address] Phone: [phone]   Fax:  [phone]  Physical Therapy Treatment  Patient Details  Name: [name] MRN: [id number] Date of Birth: 1928/06/13 No Data Recorded  Encounter Date: 08/30/2015      PT End of Session - 08/30/15 6060    Visit Number 8   Number of Visits 16   Date for PT Re-Evaluation 09/25/15   PT Start Time 0930   PT Stop Time 1030   PT Time Calculation (min) 60 min      Past Medical History  Diagnosis Date  . Colonic polyp   . Hyperlipemia   . Peripheral neuropathy   . Personal history of PE (pulmonary embolism) 2011  . Numbness in feet   . 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 Arthritis   . Difficulty sleeping     Past Surgical History  Procedure Laterality Date  . Nephrectomy  1993    RT -   . Cesarean section    . Appendectomy    . Abdominal hysterectomy    . Total hip arthroplasty Right 02/25/2013    Procedure: RIGHT TOTAL HIP ARTHROPLASTY ANTERIOR APPROACH;  Surgeon: Mcarthur Rossetti, MD;  Location: [facility];  Service: Orthopedics;  Laterality: Right;  . Cataracts Bilateral 09/2014    removal    There were no vitals filed for this visit.  Visit Diagnosis:  Pain in neck  Stiffness of cervical spine  Primary stabbing headache      Subjective Assessment - 08/30/15 0931    Subjective Still haven't been [facility] the [facility] yet. Haven't heard back from the doctor about dry needling yet. Going over to [facility] to start doing some machines. Doing better with the pain in her neck   Pain Score 2    Pain Location Neck                         [facility] Adult PT Treatment/Exercise - 08/30/15 0940    Lumbar Exercises: Aerobic   Tread Mill 1.0 miles an hour for 6 minutes  cues for even gait as her step length was decreased on the L   UBE (Upper Arm Bike) L1 forward 3 min backward 3 min   Shoulder  Exercises: Supine   Shoulder Flexion Weight (lbs) 2# 2 set 10 reps   Other Supine Exercises serratus punches 2# bilateral 2 sets 20 reps   Shoulder Exercises: Standing   Other Standing Exercises circles with therapy ball L and R 1 minute each   Manual Therapy   Manual Therapy Soft tissue mobilization;Manual Traction   Soft tissue mobilization upper trap passive stretch 3x 30 sec upper trap min overpressure   Myofascial Release suboccipital release 3x 30sec   Manual Traction gentle 3x 30 sec                  PT Short Term Goals - 08/28/15 1248    PT SHORT TERM GOAL #1   Title Patient will be I with initial HEP    Status Achieved   PT SHORT TERM GOAL #2   Title Pt will understand posture and body mechanics in order to decrease neck strain, especially with bed mobilty   Status Achieved   PT SHORT TERM GOAL #3   Title Pt will resume walking program as able without pain increase in neck   Time 4  Period Weeks   Status On-going   PT SHORT TERM GOAL #4   Title Pt will report ability to turn head with less shooting pain into head   Status Achieved           PT Long Term Goals - 08/28/15 1250    PT LONG TERM GOAL #1   Title Pt wil be I with more advanced HEP as of last visit   Time 8   Period Weeks   Status On-going   PT LONG TERM GOAL #2   Title Pt will be able to rotate C-spine to 50 deg or more with min pain in neck   Status Achieved   PT LONG TERM GOAL #3   Title Pt will be able to get in and out of bed without increase in neck pain    Status Achieved   PT LONG TERM GOAL #4   Title Pt will be able to resume full exercise plan with no increase in neck pain    Time 8   Period Weeks   Status On-going   PT LONG TERM GOAL #5   Title Pt will report no increased neck pain while reading   Status Achieved               Plan - 08/30/15 0928    Clinical Impression Statement Patient continues to report low pain in the neck and says she plans to return to  [facility] with silver sneakers program to begin doing some low level machines. She is still waiting on word from her doctor to begin dry needling. Today we worked on scapular stab, STM, and some treadmill tolerance.   PT Next Visit Plan   single leg and narrow base of support balance;  neck ROM especially rotation, manual techniques, moist heat        Problem List Patient Active Problem List   Diagnosis Date Noted  . Tachycardia 06/19/2014  . Chronic anticoagulation 06/19/2014  . History of pulmonary embolism 06/19/2014  . Degenerative arthritis of hip 02/25/2013  . Hereditary and idiopathic peripheral neuropathy 01/05/2013  . Hip pain 01/05/2013    Laury Axon, [place] 08/30/2015 10:16 AM PHONE:[phone] [facility] [place], [place], [address] Phone: [phone]   Fax:  [phone]  Name: [name] MRN: [id number] Date of Birth: 08-23-1928

## 2015-09-04 ENCOUNTER — Ambulatory Visit: Payer: Medicare Other | Admitting: Physical Therapy

## 2015-09-04 DIAGNOSIS — M542 Cervicalgia: Secondary | ICD-10-CM

## 2015-09-04 DIAGNOSIS — R2991 Unspecified symptoms and signs involving the musculoskeletal system: Secondary | ICD-10-CM

## 2015-09-04 DIAGNOSIS — Z7409 Other reduced mobility: Secondary | ICD-10-CM

## 2015-09-04 DIAGNOSIS — G4485 Primary stabbing headache: Secondary | ICD-10-CM

## 2015-09-04 DIAGNOSIS — M436 Torticollis: Secondary | ICD-10-CM

## 2015-09-04 NOTE — Therapy (Signed)
[place], [place], [address] Phone: [phone]   Fax:  [phone]  Physical Therapy Treatment/Discharge Summary  Patient Details  Name: [name] MRN: [id number] Date of Birth: Feb 26, 1928 No Data Recorded  Encounter Date: 09/04/2015      PT End of Session - 09/04/15 1143    Visit Number 9   Number of Visits 16   Date for PT Re-Evaluation 09/25/15   Authorization Type [employer] Medicare   PT Start Time 1100   PT Stop Time 1130   PT Time Calculation (min) 30 min   Activity Tolerance Patient tolerated treatment well      Past Medical History  Diagnosis Date  . Colonic polyp   . Hyperlipemia   . Peripheral neuropathy   . Personal history of PE (pulmonary embolism) 2011  . Numbness in feet   . 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 Arthritis   . Difficulty sleeping     Past Surgical History  Procedure Laterality Date  . Nephrectomy  1993    RT -   . Cesarean section    . Appendectomy    . Abdominal hysterectomy    . Total hip arthroplasty Right 02/25/2013    Procedure: RIGHT TOTAL HIP ARTHROPLASTY ANTERIOR APPROACH;  Surgeon: Mcarthur Rossetti, MD;  Location: [facility];  Service: Orthopedics;  Laterality: Right;  . Cataracts Bilateral 09/2014    removal    There were no vitals filed for this visit.  Visit Diagnosis:  Pain in neck  Stiffness of cervical spine  Primary stabbing headache  Impaired transfers      Subjective Assessment - 09/04/15 1106    Subjective No pain today.  I feel 100% better.  Patient expresses readiness for discharge for PT.     Currently in Pain? No/denies   Pain Score 0-No pain   Pain Location Neck            [facility] PT Assessment - 09/04/15 1112    AROM   Cervical Flexion 55   Cervical Extension 47   Cervical - Right Side Bend 30   Cervical - Left Side Bend 35   Cervical - Right Rotation 40   Cervical - Left Rotation 35                      [facility] Adult PT Treatment/Exercise - 09/04/15 1109    Lumbar Exercises: Aerobic   UBE (Upper Arm Bike) Nu-Step L3 10 min   Shoulder Exercises: Seated   Other Seated Exercises cervical rotation with towel 10x R/L   Shoulder Exercises: ROM/Strengthening   Other ROM/Strengthening Exercises Review of band exercises in supine and sitting (given a green band for future use)                  PT Short Term Goals - 09/04/15 1110    PT SHORT TERM GOAL #1   Title Patient will be I with initial HEP    Status Achieved   PT SHORT TERM GOAL #2   Title Pt will understand posture and body mechanics in order to decrease neck strain, especially with bed mobilty   Status Achieved   PT SHORT TERM GOAL #3   Title Pt will resume walking program as able without pain increase in neck   Status Achieved   PT SHORT TERM GOAL #4   Title Pt will report ability to turn head with less  shooting pain into head   Status Achieved           PT Long Term Goals - 09-30-2015 1111    PT LONG TERM GOAL #1   Title Pt wil be I with more advanced HEP as of last visit   Status Achieved   PT LONG TERM GOAL #2   Title Pt will be able to rotate C-spine to 50 deg or more with min pain in neck   Status Partially Met   PT LONG TERM GOAL #3   Title Pt will be able to get in and out of bed without increase in neck pain    Status Achieved   PT LONG TERM GOAL #4   Title Pt will be able to resume full exercise plan with no increase in neck pain    Status Partially Met   PT LONG TERM GOAL #5   Title Pt will report no increased neck pain while reading   Status Achieved               Plan - 09-30-15 1226    Clinical Impression Statement The patient reports she is "100% better."  She is able to turn her head without pain now.  Her AROM is much improved in all planes.  She demonstrates good carryover and compliance with HEP.  She has met the majority of rehab goals.  She expresses  readiness for discharge from PT at this time.            G-Codes - September 30, 2015 1229    Functional Assessment Tool Used FOTO   Functional Limitation Changing and maintaining body position   Changing and Maintaining Body Position Goal Status [phone]) At least 40 percent but less than 60 percent impaired, limited or restricted   Changing and Maintaining Body Position Discharge Status ([id number]) At least 40 percent but less than 60 percent impaired, limited or restricted      Problem List Patient Active Problem List   Diagnosis Date Noted  . Tachycardia 06/19/2014  . Chronic anticoagulation 06/19/2014  . History of pulmonary embolism 06/19/2014  . Degenerative arthritis of hip 02/25/2013  . Hereditary and idiopathic peripheral neuropathy 01/05/2013  . Hip pain 01/05/2013    Alvera Singh Sep 30, 2015, 12:30 PM  [facility] [address] [place], [place], [address] Phone: [phone]   Fax:  [phone]  Name: [name] MRN: [id number] Date of Birth: 27-Dec-1927  Ruben Im, PT 30-Sep-2015 12:36 PM Phone: [phone] Fax: [phone]  PHYSICAL THERAPY DISCHARGE SUMMARY  Visits from Start of Care: 9 Current functional level related to goals / functional outcomes: See clinical impressions above.   Remaining deficits: As above.   Education / Equipment: Comprehensive HEP. Plan: Patient agrees to discharge.  Patient goals were partially met. Patient is being discharged due to meeting the stated rehab goals.  ?????

## 2015-09-06 ENCOUNTER — Encounter: Payer: Self-pay | Admitting: Physical Therapy

## 2015-09-19 ENCOUNTER — Ambulatory Visit
Admission: RE | Admit: 2015-09-19 | Discharge: 2015-09-19 | Disposition: A | Discharge status: Home / Self Care | Payer: Medicare Other | Source: Ambulatory Visit

## 2015-09-19 DIAGNOSIS — Z1231 Encounter for screening mammogram for malignant neoplasm of breast: Secondary | ICD-10-CM

## 2015-10-02 ENCOUNTER — Ambulatory Visit: Payer: Medicare Other | Admitting: Nurse Practitioner

## 2015-10-05 ENCOUNTER — Ambulatory Visit
Admission: RE | Admit: 2015-10-05 | Discharge: 2015-10-05 | Disposition: A | Discharge status: Home / Self Care | Payer: Medicare Other | Source: Ambulatory Visit | Attending: Family Medicine | Admitting: Family Medicine

## 2015-10-05 ENCOUNTER — Other Ambulatory Visit: Payer: Self-pay | Admitting: Family Medicine

## 2015-10-05 DIAGNOSIS — R0989 Other specified symptoms and signs involving the circulatory and respiratory systems: Secondary | ICD-10-CM

## 2015-10-05 DIAGNOSIS — R942 Abnormal results of pulmonary function studies: Secondary | ICD-10-CM

## 2015-10-05 DIAGNOSIS — M25571 Pain in right ankle and joints of right foot: Secondary | ICD-10-CM

## 2015-10-09 ENCOUNTER — Other Ambulatory Visit: Payer: Self-pay | Admitting: Family Medicine

## 2015-10-09 DIAGNOSIS — R911 Solitary pulmonary nodule: Secondary | ICD-10-CM

## 2015-10-10 ENCOUNTER — Ambulatory Visit
Admission: RE | Admit: 2015-10-10 | Discharge: 2015-10-10 | Disposition: A | Discharge status: Home / Self Care | Payer: Medicare Other | Source: Ambulatory Visit | Attending: Family Medicine | Admitting: Family Medicine

## 2015-10-10 DIAGNOSIS — R911 Solitary pulmonary nodule: Secondary | ICD-10-CM

## 2015-10-10 MED ORDER — IOPAMIDOL (ISOVUE-300) INJECTION 61%
75.0000 mL | Freq: Once | INTRAVENOUS | Status: AC | PRN
  Administered 2015-10-10: 75 mL via INTRAVENOUS

## 2015-10-17 ENCOUNTER — Ambulatory Visit (INDEPENDENT_AMBULATORY_CARE_PROVIDER_SITE_OTHER): Payer: Medicare Other | Admitting: Internal Medicine

## 2015-10-17 ENCOUNTER — Encounter: Payer: Self-pay | Admitting: Internal Medicine

## 2015-10-17 VITALS — BP 124/78 | HR 79 | Ht 68.0 in | Wt 170.4 lb

## 2015-10-17 DIAGNOSIS — R911 Solitary pulmonary nodule: Secondary | ICD-10-CM | POA: Diagnosis not present

## 2015-10-17 DIAGNOSIS — Z86711 Personal history of pulmonary embolism: Secondary | ICD-10-CM

## 2015-10-17 NOTE — Patient Instructions (Signed)
Please see patient coordinator before you leave today  to schedule PET scan and we will you the results

## 2015-10-17 NOTE — Progress Notes (Signed)
   Subjective:    Patient ID: [name], female    DOB: September 11, 1928,    MRN: [id number]  HPI  43 yobf quit smoking around 1990 with no respiratory problems until PE in 07/2010 unprovoked > lifelong coumadin rec  referred by Dr Sandi Mariscal for abn ct    10/17/2015 1st [facility] office visit/ [name]   Chief Complaint  Patient presents with  . Pulmonary Consult    Referred by Dr. Derinda Late for eval of abnormal ct chest. She denies having any respiratory co's today.     No   assoc chronic cough or cp or chest tightness, subjective wheeze overt sinus or hb symptoms. No unusual exp hx or h/o childhood pna/ asthma or knowledge of premature birth.  Sleeping ok without nocturnal  or early am exacerbation  of respiratory  c/o's or need for noct saba. Also denies any obvious fluctuation of symptoms with weather or environmental changes or other aggravating or alleviating factors except as outlined above   Current Medications, Allergies, Complete Past Medical History, Past Surgical History, Family History, and Social History were reviewed in [employer] record.               Review of Systems  Constitutional: Negative for fever, chills and unexpected weight change.  HENT: Negative for congestion, dental problem, ear pain, nosebleeds, postnasal drip, rhinorrhea, sinus pressure, sneezing, sore throat, trouble swallowing and voice change.   Eyes: Negative for visual disturbance.  Respiratory: Negative for cough, choking and shortness of breath.   Cardiovascular: Negative for chest pain and leg swelling.  Gastrointestinal: Negative for vomiting, abdominal pain and diarrhea.  Genitourinary: Negative for difficulty urinating.  Musculoskeletal: Negative for arthralgias.  Skin: Negative for rash.  Neurological: Negative for tremors, syncope and headaches.  Hematological: Does not bruise/bleed easily.       Objective:   Physical Exam  Wt Readings from Last  3 Encounters:  10/17/15 170 lb 6.4 oz (77.293 kg)  06/19/15 165 lb (74.844 kg)  04/03/15 167 lb 8 oz (75.978 kg)    Vital signs reviewed   HEENT: nl dentition, turbinates, and oropharynx. Nl external ear canals without cough reflex   NECK :  without JVD/Nodes/TM/ nl carotid upstrokes bilaterally   LUNGS: no acc muscle use,  Nl contour chest which is clear to A and P bilaterally without cough on insp or exp maneuvers   CV:  RRR  no s3 or murmur or increase in P2, no edema   ABD:  soft and nontender with nl inspiratory excursion in the supine position. No bruits or organomegaly, bowel sounds nl  MS:  Nl gait/ ext warm without deformities, calf tenderness, cyanosis or clubbing No obvious joint restrictions   SKIN: warm and dry without lesions    NEURO:  alert, approp, nl sensorium with  no motor deficits      . I personally reviewed images and agree with radiology impression as follows:  CT Chest   10/10/15 1.9 cm spiculated mass at the left apex likely represent lung carcinoma. Two areas of ground-glass opacity in the right upper lobe, measuring 7 mm and 9 mm respectively          Assessment & Plan:

## 2015-10-18 ENCOUNTER — Encounter: Payer: Self-pay | Admitting: Internal Medicine

## 2015-10-18 NOTE — Assessment & Plan Note (Signed)
First noted 02/17/2013  See CT chest 10/10/15  > PET rec  I was hoping this was just an infarct from remote PE when I reviewed the cxr's but unfortunately it does appear to be more prominent now than in 2014 so this is very unlikely and maligancy is the likely culprit.   Discussed in detail all the  indications, usual  risks and alternatives  relative to the benefits with patient who agrees to proceed with conservative f/u as outlined with PET first then consider options for bx vs excision - issues of having to hold the coumadin also addressed .  Total time devoted to counseling  = 35/24mreview case with pt/ discussion of options/alternatives/ giving and going over instructions (see avs)

## 2015-10-30 ENCOUNTER — Ambulatory Visit (HOSPITAL_COMMUNITY)
Admission: RE | Admit: 2015-10-30 | Discharge: 2015-10-30 | Disposition: A | Discharge status: Home / Self Care | Payer: Medicare Other | Source: Ambulatory Visit | Attending: Internal Medicine | Admitting: Internal Medicine

## 2015-10-30 ENCOUNTER — Other Ambulatory Visit: Payer: Self-pay | Admitting: Internal Medicine

## 2015-10-30 DIAGNOSIS — R911 Solitary pulmonary nodule: Secondary | ICD-10-CM

## 2015-10-30 DIAGNOSIS — Z905 Acquired absence of kidney: Secondary | ICD-10-CM | POA: Diagnosis not present

## 2015-10-30 DIAGNOSIS — Z8742 Personal history of other diseases of the female genital tract: Secondary | ICD-10-CM | POA: Insufficient documentation

## 2015-10-30 LAB — GLUCOSE, CAPILLARY: Glucose-Capillary: 107 mg/dL — ABNORMAL HIGH (ref 65–99)

## 2015-10-30 MED ORDER — FLUDEOXYGLUCOSE F - 18 (FDG) INJECTION
8.4000 | Freq: Once | INTRAVENOUS | Status: AC | PRN
  Administered 2015-10-30: 8.4 via INTRAVENOUS

## 2015-10-30 NOTE — Progress Notes (Signed)
Quick Note:  Referral was made ______

## 2015-11-01 ENCOUNTER — Institutional Professional Consult (permissible substitution) (INDEPENDENT_AMBULATORY_CARE_PROVIDER_SITE_OTHER): Payer: Medicare Other | Admitting: Thoracic Surgery (Cardiothoracic Vascular Surgery)

## 2015-11-01 VITALS — BP 125/81 | HR 94 | Resp 16

## 2015-11-01 DIAGNOSIS — D381 Neoplasm of uncertain behavior of trachea, bronchus and lung: Secondary | ICD-10-CM | POA: Diagnosis not present

## 2015-11-01 NOTE — Progress Notes (Signed)
PCP is Marylene Land, MD Referring Provider is Tanda Rockers, MD  Chief Complaint  Patient presents with  . Lung Lesion    LUL hypermetabolic on PET, small LLL nodule...ct chest 10/10/15, pet 10/30/15    HPI: 82 year old woman sent for consultation regarding a left upper lobe nodule.  [name] is a an 82 year old woman with a past medical history significant for pulmonary emboli in 2011, idiopathic peripheral neuropathy, tachycardia, hyperlipidemia, and arthritis. She has a fairly light smoking history smoking about 1/4 to 1/2 a pack per day for 20-30 years. She quit smoking in 1990.  She had bilateral pulmonary emboli in 2011 and has been on Coumadin since then.  She recently saw Dr. Sandi Mariscal for physical exam. She was noted to have some rales on exam. A chest x-ray showed a lung nodule. She had a CT scan which showed 1.8 cm spiculated mass in the left apex. She saw Dr. Melvyn Novas. A PET CT showed this mass was hypermetabolic. There was no hypermetabolic hilar or mediastinal adenopathy, and no evidence of distant metastasis.  She remains active. She had a right hip replacement in 2014, since then she's been walking on a regular basis up until a few months ago when she started having problems with neck pain. This was not exertional in nature. She denies any cough, hemoptysis, wheezing, or shortness of breath with moderate exertion. She denies chest pain, pressure, and tightness. She has not lost any weight and in fact has gained 4 pounds over the past 3 months.  Zubrod Score: At the time of surgery this patient's most appropriate activity status/level should be described as: '[x]'$     0    Normal activity, no symptoms '[]'$     1    Restricted in physical strenuous activity but ambulatory, able to do out light work '[]'$     2    Ambulatory and capable of self care, unable to do work activities, up and about >50 % of waking hours                              '[]'$     3    Only limited self care, in bed  greater than 50% of waking hours '[]'$     4    Completely disabled, no self care, confined to bed or chair '[]'$     5    Moribund    Past Medical History  Diagnosis Date  . Colonic polyp   . Hyperlipemia   . Peripheral neuropathy ([place])   . Personal history of PE (pulmonary embolism) 2011  . Numbness in feet   . 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 Arthritis   . Difficulty sleeping     Past Surgical History  Procedure Laterality Date  . Nephrectomy  1993    RT -   . Cesarean section    . Appendectomy    . Abdominal hysterectomy    . Total hip arthroplasty Right 02/25/2013    Procedure: RIGHT TOTAL HIP ARTHROPLASTY ANTERIOR APPROACH;  Surgeon: Mcarthur Rossetti, MD;  Location: [facility];  Service: Orthopedics;  Laterality: Right;  . Cataracts Bilateral 09/2014    removal    Family History  Problem Relation Age of Onset  . Hypertension Father   . Diabetes Father   . Hypertension Mother   . Colon cancer Sister     Social History Social History  Substance Use Topics  .  Smoking status: Former Smoker -- 0.25 packs/day for 20 years    Types: Cigarettes    Quit date: 10/20/1989  . Smokeless tobacco: Never Used  . Alcohol Use: No    Current Outpatient Prescriptions  Medication Sig Dispense Refill  . CALCIUM PO Take 1 tablet by mouth 2 (two) times daily.    Marland Kitchen gabapentin (NEURONTIN) 100 MG capsule 3 tablets at bedtime    . metoprolol succinate (TOPROL-XL) 25 MG 24 hr tablet Take 0.5 tablets (12.5 mg total) by mouth daily. 45 tablet 3  . Multiple Vitamin (MULTIVITAMIN WITH MINERALS) TABS Take 1 tablet by mouth daily.    . Naproxen Sodium (ALEVE PO) Take 1 tablet by mouth 2 (two) times daily.    Marland Kitchen omeprazole (PRILOSEC) 20 MG capsule Take 20 mg by mouth daily.    . pravastatin (PRAVACHOL) 20 MG tablet Take 20 mg by mouth daily.    Marland Kitchen warfarin (COUMADIN) 1 MG tablet Takes 5.'5mg'$  po daily until 04-04-15, will get INR check 04-04-15.  0   No current facility-administered  medications for this visit.    Allergies  Allergen Reactions  . Penicillins Rash    Review of Systems  Constitutional: Negative for fever, activity change and unexpected weight change (has gained 4 pounds in 3 months).  HENT: Negative for trouble swallowing and voice change.   Eyes: Negative for visual disturbance.  Respiratory: Negative for cough, shortness of breath and wheezing.   Cardiovascular: Negative for chest pain and leg swelling.  Gastrointestinal: Negative for abdominal pain and blood in stool.  Genitourinary: Negative for dysuria, hematuria and difficulty urinating.  Musculoskeletal: Positive for neck pain and neck stiffness.  Neurological: Positive for numbness. Negative for dizziness, seizures and weakness.  Hematological: Negative for adenopathy. Bruises/bleeds easily.  All other systems reviewed and are negative.   BP 125/81 mmHg  Pulse 94  Resp 16  SpO2 97% Physical Exam  Constitutional: She is oriented to person, place, and time. She appears well-developed and well-nourished. No distress.  HENT:  Head: Normocephalic and atraumatic.  Eyes: Conjunctivae and EOM are normal. No scleral icterus.  Neck: Neck supple. No thyromegaly present.  Cardiovascular: Normal rate, regular rhythm, normal heart sounds and intact distal pulses.  Exam reveals no gallop and no friction rub.   No murmur heard. Some superficial varicosities B LE  Pulmonary/Chest: Effort normal and breath sounds normal. She has no wheezes. She has no rales.  Abdominal: Soft. She exhibits no distension. There is no tenderness.  Musculoskeletal: She exhibits no edema.  Lymphadenopathy:    She has no cervical adenopathy.  Neurological: She is alert and oriented to person, place, and time. No cranial nerve deficit. She exhibits normal muscle tone.  Skin: Skin is dry.  Psychiatric: She has a normal mood and affect.  Vitals reviewed.    Diagnostic Tests: CT CHEST WITH  CONTRAST  TECHNIQUE: Multidetector CT imaging of the chest was performed during intravenous contrast administration.  CONTRAST: 51m ISOVUE-300 IOPAMIDOL (ISOVUE-300) INJECTION 61%  COMPARISON: 10/05/2015. CT 08/06/2010.  FINDINGS: 19 mm spiculated mass at the left apex is highly likely represent carcinoma. Spiculations extend to the pleura and there is slight pleural thickening. No chest wall destruction.  There are 2 areas of ground-glass opacity in the right lung, 1 image 23 in the right upper lobe measuring 9 mm in diameter and the other on image 33 in the inferior right upper lobe measuring 7 mm in diameter. Adenocarcinoma could not be excluded in these locations.  There  is mild benign scarring of both lung bases.  No pleural or pericardial fluid. No enlarged hilar or mediastinal lymph nodes. There is atherosclerosis of the aorta and the coronary arteries.  Likely benign thyroid nodules the do not require additional follow-up.  Ordinary degenerative changes of the thoracic spine.  IMPRESSION:  1.9 cm spiculated mass at the left apex likely represent lung carcinoma.  Two areas of ground-glass opacity in the right upper lobe, measuring 7 mm and 9 mm respectively. Normally, 3 month follow-up would be recommended to assess for persistence or resolution. However in this case, if PET is being performed, they could be evaluated in the interim at that time.   Electronically Signed  By: Nelson Chimes M.D.  On: 10/10/2015 09:05  PET/CT ADDENDUM REPORT: 10/30/2015 10:36 ADDENDUM: Correction impression 2. "NO" hypermetabolic mediastinal lymph nodes or distant metastasis. Electronically Signed  By: Suzy Bouchard M.D.  On: 10/30/2015 10:36     Study Result     CLINICAL DATA: Initial treatment strategy for pulmonary nodule. RIGHT nephrectomy for benign involvement of endometriosis (per prior H and P note).  EXAM: NUCLEAR MEDICINE PET  SKULL BASE TO THIGH  TECHNIQUE: 8.4 mCi F-18 FDG was injected intravenously. Full-ring PET imaging was performed from the skull base to thigh after the radiotracer. CT data was obtained and used for attenuation correction and anatomic localization.  FASTING BLOOD GLUCOSE: Value: 107 mg/dl  COMPARISON: CT 10/10/2015  FINDINGS: NECK  No hypermetabolic lymph nodes in the neck.  CHEST  Hypermetabolic nodule in the LEFT upper lobe measures 18 mm (image 44, series 4) with SUV with 10.8.  There is linear scarring at the lung bases.  Small nodule most inferior LEFT lower lobe measures 4 mm image 89, series 4. This is too small to accurately characterize by FDG PET imaging.  No hypermetabolic mediastinal lymph nodes. No enlarged mediastinal lymph nodes. No hilar adenopathy. No supraclavicular adenopathy.  ABDOMEN/PELVIS  Patient status post RIGHT nephrectomy. No nodularity or abnormal metabolic activity in the nephrectomy bed. No abnormal activity in the liver. The LEFT adrenal gland is normal. The RIGHT is normal. No hypermetabolic abdominal or pelvic lymph nodes.  SKELETON  There is a focus of metabolic activity associated with the anterior junction of the T11 and T12 vertebral bodies. This is a bulky osteophyte complex at this level this activity is felt to be degenerative in nature.  IMPRESSION: 1. Hypermetabolic LEFT upper lobe pulmonary nodule is concerning for bronchogenic carcinoma (T1a N0 M0). 2. New hypermetabolic mediastinal lymph nodes or distant metastasis. 3. Single small LEFT lower lobe pulmonary nodule. Recommend attention on follow-up.  Electronically Signed: By: Suzy Bouchard M.D. On: 10/30/2015 09:52    I personally reviewed the CT and PET CT and concur with the findings as noted above. She has a spiculated left upper lobe nodule that is markedly hypermetabolic and highly suspicious for primary bronchogenic carcinoma.    Impression: [name] is an 82 year old woman with a newly discovered left upper lobe nodule that is hypermetabolic by PET CT. This is highly suspicious for primary bronchogenic carcinoma. She also has 2 faint groundglass opacities in the right upper lobe. These could potentially be adenocarcinoma in situ, but are not of clinical significance at this point in time other than the need for continued follow-up.  I reviewed the CT and PET CT with Ms. Blecha. We discussed the differential diagnosis. She understands that although not definitively cancer, this nodule needs to be considered cancer unless proven otherwise. We discussed potential  diagnostic and treatment algorithms. She understands that surgery is generally the preferred treatment. The primary alternative in her case would be stereotactic radiation. She understands that the decision of whether to biopsy the nodule would depend on which treatment she would like to pursue. She is very reluctant to consider surgery, but also a little hesitant to consider radiation as well.  I did discuss the general nature of the surgical procedure. She is 82 and has had bilateral pulmonary emboli, so there is some significant risk in her case. That being said she is in good health otherwise. She would need pulmonary function testing, although I don't think respiratory reserve would be an issue. She would also need an evaluation of her right heart function, this could likely be achieved with echocardiogram. The other alternative would be a right heart catheterization.  We repeat we discussed potential risks of surgery including death, MI, PE, DVT, bleeding, infection. She had a lot of questions about risks of radiation which I deferred to radiation oncology.  At present she is undecided, but is very reluctant to consider surgery. She did agree to meet with radiation oncology to discuss whether she would be a candidate for stereotactic radiation. If so then biopsy  would be appropriate. If she were to change her mind and wish to pursue surgery, I would be happy to do so assuming her PFTs and echocardiogram are okay. She would be at least moderately high risk.  Plan: Refer to radiation oncology for consultation regarding possibility of stereotactic radiation.  I will see her back in 2 weeks after she is seen radiation oncology and we can further discuss surgery if she wishes to do so.  Melrose Nakayama, MD [facility] [phone]

## 2015-11-02 ENCOUNTER — Encounter: Payer: Self-pay | Admitting: *Deleted

## 2015-11-05 ENCOUNTER — Institutional Professional Consult (permissible substitution): Payer: Medicare Other | Admitting: Internal Medicine

## 2015-11-08 ENCOUNTER — Ambulatory Visit
Admission: RE | Admit: 2015-11-08 | Discharge: 2015-11-08 | Disposition: A | Discharge status: Home / Self Care | Payer: Medicare Other | Source: Ambulatory Visit | Attending: Radiation Oncology | Admitting: Radiation Oncology

## 2015-11-08 ENCOUNTER — Encounter: Payer: Self-pay | Admitting: Radiation Oncology

## 2015-11-08 VITALS — BP 130/62 | HR 95 | Temp 98.4°F | Ht 68.0 in | Wt 169.6 lb

## 2015-11-08 DIAGNOSIS — Z510 Encounter for antineoplastic radiation therapy: Secondary | ICD-10-CM | POA: Insufficient documentation

## 2015-11-08 DIAGNOSIS — C3412 Malignant neoplasm of upper lobe, left bronchus or lung: Secondary | ICD-10-CM

## 2015-11-08 HISTORY — DX: Malignant neoplasm of unspecified part of unspecified bronchus or lung: C34.90

## 2015-11-08 NOTE — Progress Notes (Signed)
Radiation Oncology         [phone]) [phone] ________________________________  Initial Outpatient Consultation - Date: 11/08/2015   Name: [name] MRN: [id number]   DOB: 05-19-1928  REFERRING PHYSICIAN: Derinda Late, MD  DIAGNOSIS AND STAGE: No matching staging information was found for the patient.  Clinical Stage I non-small cell lung cancer of the left upper lobe (biopsy pending).  HISTORY OF PRESENT ILLNESS::[name] is a 82 y.o. female who presents to the clinic because of an abnormal physical exam. There were rales noted on the exam which lead to a chest x-ray, revealing a lung nodule. A CT scan was done, revealing a 1.8 cm spiculated mass in the left apex. A PET CT showed this mass was hypermetabolic. There was no evidence of distant metastasis. She hasn't had a biopsy yet, she wanted to talk with me about radiation before proceeding. She has met with Dr. Roxan Hockey who thinks she would be a marginal surgical candidate at best and she is not really interested in surgery. Her son accompanies her. She has a history of pulmonary emboli. She is completely asymptomatic. She quit smoking many years ago.   PREVIOUS RADIATION THERAPY: No  Past medical, social and family history were reviewed in the electronic chart. Review of symptoms was reviewed in the electronic chart. Medications were reviewed in the electronic chart.   PHYSICAL EXAM:  Filed Vitals:   11/08/15 1518  BP: 130/62  Pulse: 95  Temp: 98.4 F (36.9 C)  .169 lb 9.6 oz (76.93 kg).  IMPRESSION: [name] is a 82 yo female with a Stage I non-small cell lung cancer of the left upper lobe. She is a good candidate for SBRT.  PLAN: We discussed that it is possible these areas are not intact cancer and are things such as inflammation or infection. We discussed that if that there is probably a 1 in 20 to 1 in 10 chance of these are things other than cancer. Only a biopsy would tell us if this was cancer. That being  said there is increasing evidence in patients who have the correct clinical history which she certainly does that SBRT to be effective in treating these nodules which are likely non-small cell lung cancer. We discussed that she has stage I lung cancer and the results of SBRT will provide a greater than 90% local control. We discussed that she may fail in the mediastinum, hilum or elsewhere in the body and radiation was a local only process. We discussed the process of simulation and the use of a pad all to decrease respiratory motion. We discussed the use of 4-dimensional simulation to minimize normal lung tissue treated. We discussed 5 treatments occurring every other day as an outpatient. We discussed these treatments will last about 10-20 minutes and she would be here at the hospital for about an hour. We discussed that SBRT was unlikely to make her breathing any worse. It is also unlikely to make her breathing symptoms any better. We discussed possible side effects including her shoulder pain due to arm positioning and possible rib fracture due to the pleural-based nodules proximity to the ribs.  I discussed with her that without treatment this could develop into a more aggressive or even metastatic cancer if these go untreated. She understands these risks.   She was anxious before the consultation, but after discussing the procedure and side effects, she is feeling a lot better about receiving treatment. I gave her some information that she can go  over to help her decide if she wants radiation. Since she is healthy and does not have a definitive diagnosis, I recommend that she gets a biopsy of the tumor to show if it is lung cancer. I will talk to Dr. Roxan Hockey to order a biopsy. She will be scheduled for a simulation appointment after the biopsy.  I spent 40 minutes face to face with the patient and more than 50% of that time was spent in counseling and/or coordination of  care.  ------------------------------------------------  Thea Silversmith, MD    This document serves as a record of services personally performed by Thea Silversmith, MD. It was created on her behalf by  Lendon Collar, a trained medical scribe. The creation of this record is based on the scribe's personal observations and the provider's statements to them. This document has been checked and approved by the attending provider.

## 2015-11-13 ENCOUNTER — Telehealth: Payer: Self-pay | Admitting: *Deleted

## 2015-11-13 ENCOUNTER — Other Ambulatory Visit: Payer: Self-pay | Admitting: Radiation Oncology

## 2015-11-13 DIAGNOSIS — R911 Solitary pulmonary nodule: Secondary | ICD-10-CM

## 2015-11-13 NOTE — Telephone Encounter (Addendum)
Spoke with staff re: the need to interrupt Coumadin for a CT biopsy 11-19-15, when the Coumadin should be held for the procedure.  Told that [name] plans to speak with Dr. Sandi Mariscal later today to discuss her medical situation and she plans to decide what she plans to do.  The office staff will call back with the stop time for the Coumadin prior to her procedure after the discussion.

## 2015-11-13 NOTE — Telephone Encounter (Signed)
Called the office re: CT biopsy scheduled for 11-19-15 and the need to interrupt Coumadin for the procedure. Received a call back asked to check with her PCP Dr. Derinda Late

## 2015-11-13 NOTE — Telephone Encounter (Signed)
Returned a call to [name] she had a question about when she should stop her Coumadin told that I made a few telephone calls and still did not have an answer for her question.  She told me she was going to speak with Dr. Sandi Mariscal later today; I mentioned that he would mentioned when to stop the Coumadin asked her  to follow his instructions and I was checking with his office also to find out the stop date.  Told we would talk later.

## 2015-11-14 ENCOUNTER — Telehealth: Payer: Self-pay | Admitting: *Deleted

## 2015-11-14 NOTE — Telephone Encounter (Signed)
[name] called to say after her discussion with Dr. Derinda Late yesterday she has decided not to proceed with the biopsy procedure; she is ready to do the radiation therapy when ever she needs to start.

## 2015-11-15 ENCOUNTER — Encounter: Payer: Self-pay | Admitting: Internal Medicine

## 2015-11-19 ENCOUNTER — Ambulatory Visit (HOSPITAL_COMMUNITY): Payer: Medicare Other

## 2015-11-20 ENCOUNTER — Encounter: Payer: Medicare Other | Admitting: Thoracic Surgery (Cardiothoracic Vascular Surgery)

## 2015-11-21 ENCOUNTER — Ambulatory Visit
Admission: RE | Admit: 2015-11-21 | Discharge: 2015-11-21 | Disposition: A | Discharge status: Home / Self Care | Payer: Medicare Other | Source: Ambulatory Visit | Attending: Radiation Oncology | Admitting: Radiation Oncology

## 2015-11-21 DIAGNOSIS — C3412 Malignant neoplasm of upper lobe, left bronchus or lung: Secondary | ICD-10-CM

## 2015-11-21 DIAGNOSIS — Z510 Encounter for antineoplastic radiation therapy: Secondary | ICD-10-CM | POA: Diagnosis not present

## 2015-11-21 NOTE — Progress Notes (Signed)
[place] Radiation Oncology Simulation and Treatment Planning Note   Name: [name] MRN: [id number]  Date: 11/21/2015  DOB: 1928/01/28   DIAGNOSIS: There were no encounter diagnoses.  CONSENT VERIFIED: yes  SET UP AND IMMOBILIZATION: Patient is setup supine in a vac loc with a custom moldable pillow for head and neck immobilization   NARRATIVE: The patient was brought to the [facility].  Identity was confirmed.  All relevant records and images related to the planned course of therapy were reviewed.  Then, the patient was positioned in a stable reproducible clinical set-up for radiation therapy.  CT images were obtained.  Skin markings were placed.  A four dimensional simulation was then performed to track tumor movement throughout the patients' breathing cycle. The CT images were loaded into the planning software where the target and avoidance structures were contoured.  The GTV was outlined on the free breathing, 4D and MIP image sets.  The radiation prescription was entered and confirmed.   TREATMENT PLANNING NOTE:  Treatment planning then occurred. I have requested 3D simulation with [facility] of the spinal cord, total lungs and gross tumor volume. I have also requested mlcs and an isodose plan.   Special treatment procedure will be performed as [name] will be receiving high dose per fraction.    A total of 4 complex treatment devices will be used.  This document serves as a record of services personally performed by Thea Silversmith, MD. It was created on her behalf by Darcus Austin, a trained medical scribe. The creation of this record is based on the scribe's personal observations and the provider's statements to them. This document has been checked and approved by the attending provider.

## 2015-11-27 DIAGNOSIS — Z510 Encounter for antineoplastic radiation therapy: Secondary | ICD-10-CM | POA: Diagnosis not present

## 2015-12-04 ENCOUNTER — Ambulatory Visit
Admission: RE | Admit: 2015-12-04 | Discharge: 2015-12-04 | Disposition: A | Discharge status: Home / Self Care | Payer: Medicare Other | Source: Ambulatory Visit | Attending: Radiation Oncology | Admitting: Radiation Oncology

## 2015-12-04 ENCOUNTER — Encounter: Payer: Self-pay | Admitting: Radiation Oncology

## 2015-12-04 VITALS — BP 125/76 | HR 89 | Resp 16 | Wt 169.2 lb

## 2015-12-04 DIAGNOSIS — Z510 Encounter for antineoplastic radiation therapy: Secondary | ICD-10-CM | POA: Diagnosis not present

## 2015-12-04 DIAGNOSIS — C3412 Malignant neoplasm of upper lobe, left bronchus or lung: Secondary | ICD-10-CM

## 2015-12-04 NOTE — Progress Notes (Signed)
Weight and vitals stable. Denies pain. Denies cough, SOB or painful/difficult swallowing. No skin changes within the treatment field. Denies fatigue. Patient without complaints at this time.  BP 125/76 mmHg  Pulse 89  Resp 16  Wt 169 lb 3.2 oz (76.749 kg)  SpO2 96% Wt Readings from Last 3 Encounters:  12/04/15 169 lb 3.2 oz (76.749 kg)  11/08/15 169 lb 9.6 oz (76.93 kg)  10/17/15 170 lb 6.4 oz (77.293 kg)

## 2015-12-04 NOTE — Progress Notes (Signed)
Weekly Management Note Current Dose:  12 Gy  Projected Dose: 60 Gy   Narrative:  The patient presents for routine under treatment assessment.  CBCT/MVCT images/Port film x-rays were reviewed.  The chart was checked. Doing well. No complaints.   Physical Findings: Weight: 169 lb 3.2 oz (76.749 kg). Unchanged  Impression:  The patient is tolerating radiation.  Plan:  Continue treatment as planned.

## 2015-12-05 ENCOUNTER — Ambulatory Visit: Payer: Medicare Other | Admitting: Radiation Oncology

## 2015-12-06 ENCOUNTER — Ambulatory Visit
Admission: RE | Admit: 2015-12-06 | Discharge: 2015-12-06 | Disposition: A | Discharge status: Home / Self Care | Payer: Medicare Other | Source: Ambulatory Visit | Attending: Radiation Oncology | Admitting: Radiation Oncology

## 2015-12-06 DIAGNOSIS — Z510 Encounter for antineoplastic radiation therapy: Secondary | ICD-10-CM | POA: Diagnosis not present

## 2015-12-07 ENCOUNTER — Ambulatory Visit: Payer: Medicare Other | Admitting: Radiation Oncology

## 2015-12-10 ENCOUNTER — Ambulatory Visit
Admission: RE | Admit: 2015-12-10 | Discharge: 2015-12-10 | Disposition: A | Discharge status: Home / Self Care | Payer: Medicare Other | Source: Ambulatory Visit | Attending: Radiation Oncology | Admitting: Radiation Oncology

## 2015-12-10 DIAGNOSIS — Z510 Encounter for antineoplastic radiation therapy: Secondary | ICD-10-CM | POA: Diagnosis not present

## 2015-12-12 ENCOUNTER — Encounter: Payer: Self-pay | Admitting: Radiation Oncology

## 2015-12-12 ENCOUNTER — Ambulatory Visit
Admission: RE | Admit: 2015-12-12 | Discharge: 2015-12-12 | Disposition: A | Discharge status: Home / Self Care | Payer: Medicare Other | Source: Ambulatory Visit | Attending: Radiation Oncology | Admitting: Radiation Oncology

## 2015-12-12 VITALS — BP 133/72 | HR 71 | Temp 97.9°F | Wt 169.8 lb

## 2015-12-12 DIAGNOSIS — Z510 Encounter for antineoplastic radiation therapy: Secondary | ICD-10-CM | POA: Diagnosis not present

## 2015-12-12 DIAGNOSIS — C3412 Malignant neoplasm of upper lobe, left bronchus or lung: Secondary | ICD-10-CM

## 2015-12-12 NOTE — Progress Notes (Signed)
[name] has received 4 fracatons to her left upper lobe.  Denies any pain today.  Appetite is good.  Skin normal to left chest.  Energy level good.  No respiratory problems. O2 sat 100% today. Wt Readings from Last 3 Encounters:  12/12/15 169 lb 12.8 oz (77.021 kg)  12/04/15 169 lb 3.2 oz (76.749 kg)  11/08/15 169 lb 9.6 oz (76.93 kg)  BP 133/72 mmHg  Pulse 71  Temp(Src) 97.9 F (36.6 C) (Oral)  Wt 169 lb 12.8 oz (77.021 kg)  SpO2 100%

## 2015-12-13 NOTE — Addendum Note (Signed)
Encounter addended by: Malena Edman, RN on: 12/13/2015  9:09 AM<BR>     Documentation filed: Inpatient Patient Education

## 2015-12-13 NOTE — Progress Notes (Signed)
Weekly Management Note Current Dose:  48 Gy  Projected Dose: 60 Gy   Narrative:  The patient presents for routine under treatment assessment.  CBCT/MVCT images/Port film x-rays were reviewed.  The chart was checked. Doing well. No complaints.   Physical Findings: Weight: 169 lb 12.8 oz (77.021 kg). Unchanged  Impression:  The patient is tolerating radiation.  Plan:  Continue treatment as planned. Follow up in 1 month. Discussed survivorship

## 2015-12-14 ENCOUNTER — Encounter: Payer: Self-pay | Admitting: Radiation Oncology

## 2015-12-14 ENCOUNTER — Ambulatory Visit
Admission: RE | Admit: 2015-12-14 | Discharge: 2015-12-14 | Disposition: A | Discharge status: Home / Self Care | Payer: Medicare Other | Source: Ambulatory Visit | Attending: Radiation Oncology | Admitting: Radiation Oncology

## 2015-12-14 DIAGNOSIS — Z510 Encounter for antineoplastic radiation therapy: Secondary | ICD-10-CM | POA: Diagnosis not present

## 2015-12-17 NOTE — Progress Notes (Signed)
  Radiation Oncology         [phone] ________________________________  Name: [name] MRN: [id number]  Date: 12/14/2015  DOB: [birth year]  End of Treatment Note  Diagnosis:    Stage I non-small cell lung cancer of the left upper lobe   Indication for treatment:  Curative       Radiation treatment dates:   12/04/2015-12/14/2015  Site/dose:   Left upper lobe / 60 Gy in 5 fractions at 12 Gy per fraction  Beams/energy:   VMAT with 6FFF MV photons  Narrative: The patient tolerated radiation treatment relatively well.   She had no ill effects.   Plan: The patient has completed radiation treatment. The patient will return to radiation oncology clinic for routine followup in one month. I advised them to call or return sooner if they have any questions or concerns related to their recovery or treatment.  ------------------------------------------------  Thea Silversmith, MD

## 2016-01-13 NOTE — Progress Notes (Signed)
[name] is here for a one month follow up visit for non-small cell lung cancer of the left upper lobe.  Weight changes, if any: BP 124/60 mmHg  Pulse 80  Temp(Src) 97.8 F (36.6 C)  Resp 18  Ht '5\' 8"'$  (1.727 m)  Wt 169 lb 4.8 oz (76.794 kg)  BMI 25.75 kg/m2  SpO2 99% Respiratory complaints, if any: No breathing problems, O2 sat 99% Hemoptysis, if any:  No Swallowing Problems/Pain/Difficulty swallowing:None Smoking Tobacco/Marijuana/Snuff/ETOH use:o Skin:ormal color Pain : NoneAppetite: Energy level:Good When is next chemo scheduled?:None Lab work from of chart:None BP 124/60 mmHg  Pulse 80  Temp(Src) 97.8 F (36.6 C)  Resp 18  Ht '5\' 8"'$  (1.727 m)  Wt 169 lb 4.8 oz (76.794 kg)  BMI 25.75 kg/m2  SpO2 99%

## 2016-01-17 ENCOUNTER — Ambulatory Visit
Admission: RE | Admit: 2016-01-17 | Discharge: 2016-01-17 | Disposition: A | Discharge status: Home / Self Care | Payer: Medicare Other | Source: Ambulatory Visit | Attending: Radiation Oncology | Admitting: Radiation Oncology

## 2016-01-17 ENCOUNTER — Encounter: Payer: Self-pay | Admitting: Radiation Oncology

## 2016-01-17 VITALS — BP 124/60 | HR 80 | Temp 97.8°F | Resp 18 | Ht 68.0 in | Wt 169.3 lb

## 2016-01-17 DIAGNOSIS — C3412 Malignant neoplasm of upper lobe, left bronchus or lung: Secondary | ICD-10-CM

## 2016-01-17 NOTE — Progress Notes (Signed)
   Department of Radiation Oncology  Phone:  [phone] Fax:        [phone]   Name: BRYLEY CHRISMAN MRN: [id number]  DOB: [birth year]  Date: 01/17/2016  Follow Up Visit Note  Diagnosis: Stage I Non-small Cell Lung Cancer of the Left Upper Lobe  Summary and Interval since last radiation: 1 month; 12/04/2015-12/14/2015  Site/dose: Left upper lobe / 60 Gy in 5 fractions at 12 Gy per fraction  Interval History: [name] presents today for routine followup.  She has done well since treatment. She has maintained activity. She has no followup with surgery or pulmonology scheduled. She reports experiencing neck and shoulder pain that has been present since before radiation. She states that this has neither worsened or improved.  She takes celebrex occasionally.   Physical Exam:  Filed Vitals:   01/17/16 1341  BP: 124/60  Pulse: 80  Temp: 97.8 F (36.6 C)  Resp: 18  Height: '5\' 8"'$  (1.727 m)  Weight: 169 lb 4.8 oz (76.794 kg)  SpO2: 99%  This is a well appearing female in no acute distress. She is alert and oriented. No skin changes  IMPRESSION: [name] is a 82 y.o. female with stage I non-small cell lung cancer of the left upper lobe.  PLAN: I will repeat a CT of the chest in 2 weeks and then see her back in 6 months with a CT. She has been given those appointments.   ------------------------------------------------  Thea Silversmith, MD  This document serves as a record of services personally performed by Thea Silversmith, MD. It was created on her behalf by Jenell Milliner, a trained medical scribe. The creation of this record is based on the scribe's personal observations and the provider's statements to them. This document has been checked and approved by the attending provider.

## 2016-01-18 NOTE — Addendum Note (Signed)
Encounter addended by: Malena Edman, RN on: 01/18/2016 12:36 PM<BR>     Documentation filed: Medications

## 2016-01-31 ENCOUNTER — Encounter (HOSPITAL_COMMUNITY): Payer: Self-pay

## 2016-01-31 ENCOUNTER — Ambulatory Visit (HOSPITAL_COMMUNITY)
Admission: RE | Admit: 2016-01-31 | Discharge: 2016-01-31 | Disposition: A | Discharge status: Home / Self Care | Payer: Medicare Other | Source: Ambulatory Visit | Attending: Radiation Oncology | Admitting: Radiation Oncology

## 2016-01-31 DIAGNOSIS — I2510 Atherosclerotic heart disease of native coronary artery without angina pectoris: Secondary | ICD-10-CM | POA: Diagnosis not present

## 2016-01-31 DIAGNOSIS — C3412 Malignant neoplasm of upper lobe, left bronchus or lung: Secondary | ICD-10-CM | POA: Diagnosis present

## 2016-01-31 DIAGNOSIS — I7090 Unspecified atherosclerosis: Secondary | ICD-10-CM | POA: Insufficient documentation

## 2016-02-06 ENCOUNTER — Telehealth: Payer: Self-pay | Admitting: *Deleted

## 2016-02-06 NOTE — Telephone Encounter (Signed)
Called no answer let a message for her to call me back in reference to her recent CT chest scan tomorrow to [phone] nurse Kenniyah Sasaki calling for Dr. Pablo Ledger.

## 2016-02-07 ENCOUNTER — Telehealth: Payer: Self-pay | Admitting: *Deleted

## 2016-02-07 NOTE — Telephone Encounter (Signed)
Mrs. [name] returned my call from yesterday in reference to her CT scan.  I let her know that I was Dr. Unknown Jim nurse calling to let her know that her scan looks great.  The nodule is smaller and everything looks good.  Mentione that she will have a follow up scan in September scheduled by our secretary per Dr. Pablo Ledger.

## 2016-02-12 ENCOUNTER — Other Ambulatory Visit: Payer: Self-pay | Admitting: Family Medicine

## 2016-02-12 DIAGNOSIS — R270 Ataxia, unspecified: Secondary | ICD-10-CM

## 2016-02-18 ENCOUNTER — Ambulatory Visit
Admission: RE | Admit: 2016-02-18 | Discharge: 2016-02-18 | Disposition: A | Discharge status: Home / Self Care | Payer: Medicare Other | Source: Ambulatory Visit | Attending: Family Medicine | Admitting: Family Medicine

## 2016-02-18 DIAGNOSIS — R270 Ataxia, unspecified: Secondary | ICD-10-CM

## 2016-02-18 MED ORDER — GADOBENATE DIMEGLUMINE 529 MG/ML IV SOLN
15.0000 mL | Freq: Once | INTRAVENOUS | Status: AC | PRN
  Administered 2016-02-18: 15 mL via INTRAVENOUS

## 2016-04-10 ENCOUNTER — Encounter: Payer: Self-pay | Admitting: Radiation Oncology

## 2016-04-10 ENCOUNTER — Ambulatory Visit
Admission: RE | Admit: 2016-04-10 | Discharge: 2016-04-10 | Disposition: A | Discharge status: Home / Self Care | Payer: Medicare Other | Source: Ambulatory Visit | Attending: Radiation Oncology | Admitting: Radiation Oncology

## 2016-04-10 VITALS — BP 113/71 | HR 94 | Temp 98.4°F | Resp 18 | Ht 68.0 in | Wt 168.6 lb

## 2016-04-10 DIAGNOSIS — R911 Solitary pulmonary nodule: Secondary | ICD-10-CM

## 2016-04-10 NOTE — Progress Notes (Signed)
Mrs. [name] is here for a follow up visit.  She has concerns about the burning feeling around the spot where she received radiation going down into the left upper arm.; and questions about radiation side effects she would like to discuss with the Radiation oncologist before going to her PCP.  Weight changes, if any:  Wt Readings from Last 3 Encounters:  04/10/16 168 lb 9.6 oz (76.476 kg)  01/17/16 169 lb 4.8 oz (76.794 kg)  12/12/15 169 lb 12.8 oz (77.021 kg)   Respiratory complaints, if any: Denies SOB,coughing Hemoptysis, if any:  None Swallowing Problems/Pain/Difficulty swallowing:None Smoking Tobacco/Marijuana/Snuff/ETOH RDE:YCXK 10-20-89 .25 pack/day cigarette Skin:Normal color left chest Pain No pain to left chest, having a burning sensation feels like pins in the muscles of the upper arm.  Appetite:Good Fatigue:No able to do daily routine. When is next chemo scheduled?:None Lab work from of chart:No recent labs, 02-18-16 MM Brain wwo contrast, 01-31-16 CT chest wo contrast patient aware of results per Dr. Pablo Ledger BP 113/71 mmHg  Pulse 94  Temp(Src) 98.4 F (36.9 C) (Oral)  Resp 18  Ht '5\' 8"'$  (1.727 m)  Wt 168 lb 9.6 oz (76.476 kg)  BMI 25.64 kg/m2  SpO2 99%

## 2016-04-10 NOTE — Progress Notes (Signed)
  Radiation Oncology         [phone] ________________________________  Name: [name] MRN: [id number]  Date: 04/10/2016  DOB: Jul 01, 1928  Follow-Up Visit Note  CC: Debbie Land, MD  Debbie Late, MD    ICD-9-CM ICD-10-CM   1. Solitary pulmonary nodule 793.11 R91.1     Diagnosis: Stage I Non-small Cell Lung Cancer of the Left Upper Lobe  Summary and Interval since last radiation: 4 months  12/04/2015-12/14/2015: Left upper lobe / 60 Gy in 5 fractions at 12 Gy per fraction  Narrative:  The patient returns today for routine follow-up. The patient had a chest CT scan on 01/31/16 showing some regressing in the size of the left apical pulmonary nodule and stable bilateral lung nodules. Brain MRI on 02/18/16 showed no abnormalities.  ROS: She has concerns about a burning feeling around the spot where she received radiation. This feeling goes down into the left upper arm. She has questions regarding radiation side effects she would like to discuss with the radiation oncologist before going to her PCP. She denies having shingles or hemoptysis.  ALLERGIES:  is allergic to penicillins.  Meds: Current Outpatient Prescriptions  Medication Sig Dispense Refill  . CALCIUM PO Take 1 tablet by mouth 2 (two) times daily.    . celecoxib (CELEBREX) 200 MG capsule take 1 capsule by mouth once daily if needed for NECK PAIN OR ARTHRITIS  0  . gabapentin (NEURONTIN) 100 MG capsule 3 tablets at bedtime    . metoprolol succinate (TOPROL-XL) 25 MG 24 hr tablet Take 0.5 tablets (12.5 mg total) by mouth daily. 45 tablet 3  . Multiple Vitamin (MULTIVITAMIN WITH MINERALS) TABS Take 1 tablet by mouth daily.    . rivaroxaban (XARELTO) 20 MG TABS tablet Take 20 mg by mouth daily with supper.     No current facility-administered medications for this encounter.    Physical Findings: The patient is in no acute distress. Patient is alert and oriented.  height is '5\' 8"'$  (1.727 m) and weight is 168 lb  9.6 oz (76.476 kg). Her oral temperature is 98.4 F (36.9 C). Her blood pressure is 113/71 and her pulse is 94. Her respiration is 18 and oxygen saturation is 99%.   Lungs are clear to auscultation bilaterally. Heart has regular rate and rhythm. No palpable cervical, supraclavicular, or axillary adenopathy. Careful review of the pt's skin along the left chest, left upper back, and left breast skin shows no dermatitis, no evidence of shingles, no radiation changes  Lab Findings: Lab Results  Component Value Date   WBC 7.7 02/28/2013   HGB 10.9* 02/28/2013   HCT 32.4* 02/28/2013   MCV 85.9 02/28/2013   PLT 169 02/28/2013    Radiographic Findings: No results found.  Impression:  The patient is recovering from the effects of radiation. She is doing well post SBRT.  Plan: Keep her already scheduled CT scan in September and she will follow up with radiation oncology then. We discussed the potential development of shingles. She will follow up with her PCP if she notices vesicles along the chest or severe burning. ____________________________________ -----------------------------------  Debbie Promise, PhD, MD  This document serves as a record of services personally performed by Debbie Pray, MD. It was created on his behalf by [name], a trained medical scribe. The creation of this record is based on the scribe's personal observations and the provider's statements to them. This document has been checked and approved by the attending provider.

## 2016-05-07 ENCOUNTER — Other Ambulatory Visit: Payer: Self-pay | Admitting: Family Medicine

## 2016-05-07 ENCOUNTER — Ambulatory Visit
Admission: RE | Admit: 2016-05-07 | Discharge: 2016-05-07 | Disposition: A | Discharge status: Home / Self Care | Payer: Medicare Other | Source: Ambulatory Visit | Attending: Family Medicine | Admitting: Family Medicine

## 2016-05-07 DIAGNOSIS — R208 Other disturbances of skin sensation: Secondary | ICD-10-CM

## 2016-05-14 ENCOUNTER — Other Ambulatory Visit: Payer: Self-pay | Admitting: Family Medicine

## 2016-05-14 DIAGNOSIS — C3492 Malignant neoplasm of unspecified part of left bronchus or lung: Secondary | ICD-10-CM

## 2016-05-14 DIAGNOSIS — R079 Chest pain, unspecified: Secondary | ICD-10-CM

## 2016-05-14 DIAGNOSIS — C3491 Malignant neoplasm of unspecified part of right bronchus or lung: Secondary | ICD-10-CM

## 2016-05-22 ENCOUNTER — Ambulatory Visit
Admission: RE | Admit: 2016-05-22 | Discharge: 2016-05-22 | Disposition: A | Discharge status: Home / Self Care | Payer: Medicare Other | Source: Ambulatory Visit | Attending: Family Medicine | Admitting: Family Medicine

## 2016-05-22 DIAGNOSIS — C3492 Malignant neoplasm of unspecified part of left bronchus or lung: Secondary | ICD-10-CM

## 2016-05-22 DIAGNOSIS — C3491 Malignant neoplasm of unspecified part of right bronchus or lung: Secondary | ICD-10-CM

## 2016-05-22 DIAGNOSIS — R079 Chest pain, unspecified: Secondary | ICD-10-CM

## 2016-05-22 MED ORDER — GADOBENATE DIMEGLUMINE 529 MG/ML IV SOLN
15.0000 mL | Freq: Once | INTRAVENOUS | Status: AC | PRN
  Administered 2016-05-22: 15 mL via INTRAVENOUS

## 2016-06-09 ENCOUNTER — Telehealth: Payer: Self-pay | Admitting: Radiation Oncology

## 2016-06-09 NOTE — Telephone Encounter (Signed)
Left message for patient to return call as she will now be seen by Dr Sondra Come instead of Pablo Ledger and appointment time in October has been moved

## 2016-06-09 NOTE — Telephone Encounter (Signed)
error 

## 2016-07-18 ENCOUNTER — Ambulatory Visit (HOSPITAL_COMMUNITY)
Admission: RE | Admit: 2016-07-18 | Discharge: 2016-07-18 | Disposition: A | Discharge status: Home / Self Care | Payer: Medicare Other | Source: Ambulatory Visit | Attending: Radiation Oncology | Admitting: Radiation Oncology

## 2016-07-18 DIAGNOSIS — R911 Solitary pulmonary nodule: Secondary | ICD-10-CM | POA: Insufficient documentation

## 2016-07-18 DIAGNOSIS — I2510 Atherosclerotic heart disease of native coronary artery without angina pectoris: Secondary | ICD-10-CM | POA: Diagnosis not present

## 2016-07-18 DIAGNOSIS — C3412 Malignant neoplasm of upper lobe, left bronchus or lung: Secondary | ICD-10-CM | POA: Insufficient documentation

## 2016-07-18 DIAGNOSIS — I700 Atherosclerosis of aorta: Secondary | ICD-10-CM | POA: Insufficient documentation

## 2016-07-23 ENCOUNTER — Encounter: Payer: Self-pay | Admitting: Oncology

## 2016-07-24 ENCOUNTER — Ambulatory Visit
Admission: RE | Admit: 2016-07-24 | Discharge: 2016-07-24 | Disposition: A | Discharge status: Home / Self Care | Payer: Medicare Other | Source: Ambulatory Visit | Attending: Radiation Oncology | Admitting: Radiation Oncology

## 2016-07-24 ENCOUNTER — Encounter: Payer: Self-pay | Admitting: Radiation Oncology

## 2016-07-24 VITALS — BP 143/83 | HR 74 | Temp 98.1°F | Ht 68.0 in | Wt 170.8 lb

## 2016-07-24 DIAGNOSIS — R911 Solitary pulmonary nodule: Secondary | ICD-10-CM

## 2016-07-24 DIAGNOSIS — Z7901 Long term (current) use of anticoagulants: Secondary | ICD-10-CM | POA: Diagnosis not present

## 2016-07-24 DIAGNOSIS — Z880 Allergy status to penicillin: Secondary | ICD-10-CM | POA: Diagnosis not present

## 2016-07-24 DIAGNOSIS — Z923 Personal history of irradiation: Secondary | ICD-10-CM | POA: Insufficient documentation

## 2016-07-24 NOTE — Progress Notes (Signed)
[name] is here for follow up with her son after treatment to her left upper lobe.  She denies having pain, coughing or shortness of breath.  She reports having a good energy level.  BP (!) 143/83 (BP Location: Left Arm, Patient Position: Sitting)   Pulse 74   Temp 98.1 F (36.7 C) (Oral)   Ht '5\' 8"'$  (1.727 m)   Wt 170 lb 12.8 oz (77.5 kg)   SpO2 99%   BMI 25.97 kg/m    Wt Readings from Last 3 Encounters:  07/24/16 170 lb 12.8 oz (77.5 kg)  04/10/16 168 lb 9.6 oz (76.5 kg)  01/17/16 169 lb 4.8 oz (76.8 kg)

## 2016-07-24 NOTE — Addendum Note (Signed)
Encounter addended by: Jacqulyn Liner, RN on: 07/24/2016 11:56 AM<BR>    Actions taken: Charge Capture section accepted

## 2016-07-24 NOTE — Progress Notes (Signed)
Radiation Oncology         [phone] ________________________________  Name: [name] MRN: [id number]  Date: 07/24/2016  DOB: 1928/08/01  Follow-Up Visit Note  CC: Marylene Land, MD  Derinda Late, MD    ICD-9-CM ICD-10-CM   1. Solitary pulmonary nodule 793.11 R91.1     Diagnosis: Solitary pulmonary nodule in the left upper lobe  Summary and Interval since last radiation: 7 months, 2 weeks  12/04/2015-12/14/2015: Left upper lobe / 60 Gy in 5 fractions at 12 Gy per fraction (SBRT)  Narrative:  The patient returns today for routine follow-up. The patient had a chest CT scan on 01/31/16 showing some regressing in the size of the left apical pulmonary nodule and stable bilateral lung nodules. Brain MRI on 02/18/16 showed no abnormalities. Recent chest CT scan also shows further shrinkage of the pulmonary nodule.   ROS: She denies having pain, coughing, or shortness of breath. She reports having a good energy level. Patient denies any occurrence of shingles.  ALLERGIES:  is allergic to penicillins.  Meds: Current Outpatient Prescriptions  Medication Sig Dispense Refill  . CALCIUM PO Take 1 tablet by mouth 2 (two) times daily.    . celecoxib (CELEBREX) 200 MG capsule take 1 capsule by mouth once daily if needed for NECK PAIN OR ARTHRITIS  0  . gabapentin (NEURONTIN) 100 MG capsule 3 tablets at bedtime    . metoprolol succinate (TOPROL-XL) 25 MG 24 hr tablet Take 0.5 tablets (12.5 mg total) by mouth daily. 45 tablet 3  . Multiple Vitamin (MULTIVITAMIN WITH MINERALS) TABS Take 1 tablet by mouth daily.    . rivaroxaban (XARELTO) 20 MG TABS tablet Take 20 mg by mouth daily with supper.     No current facility-administered medications for this encounter.     Physical Findings: The patient is in no acute distress. Patient is alert and oriented.  height is '5\' 8"'$  (1.727 m) and weight is 170 lb 12.8 oz (77.5 kg). Her oral temperature is 98.1 F (36.7 C). Her blood pressure is  143/83 (abnormal) and her pulse is 74. Her oxygen saturation is 99%.   Lungs are clear to auscultation bilaterally. Heart has regular rate and rhythm. No palpable cervical, supraclavicular, or axillary adenopathy.    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 EXAM: CT CHEST WITHOUT CONTRAST TECHNIQUE: Multidetector CT imaging of the chest was performed following the standard protocol without IV contrast. COMPARISON:  01/31/2016 chest CT.  05/07/2016 chest radiograph. FINDINGS: Cardiovascular: Normal heart size. No significant pericardial fluid/thickening. Left anterior descending, left circumflex and right coronary atherosclerosis. Atherosclerotic nonaneurysmal thoracic aorta. Normal caliber pulmonary arteries. Mediastinum/Nodes: No discrete thyroid nodules. Unremarkable esophagus. No axillary adenopathy. There a few small left upper mediastinal prevascular lymph nodes measuring up to the 0.6 cm (series 2/ image 36), which have increased in size since 01/31/2016, however are not technically pathologically enlarged. No pathologically enlarged mediastinal or gross hilar nodes on this noncontrast study. Lungs/Pleura: No pneumothorax. No pleural effusion. Spiculated apical left upper lobe 1.3 x 1.3 cm pulmonary nodule (series 5/ image 13), previously 1.7 x 1.5 cm, mildly decreased. New sharply marginated ground-glass attenuation surrounding the  apical left upper lobe pulmonary nodule represents early post radiation change. Stable parenchymal bands at both lung bases consistent with postinfectious/ postinflammatory scarring. Three stable scattered ground-glass pulmonary nodules in the right lung,  largest 0.9 cm in the right upper lobe (series 5/ image 59) and 1.0 cm in the medial basilar right upper lobe (series 5/ image 83), all unchanged. No acute consolidative airspace disease or new significant pulmonary nodules. Upper abdomen: Stable coarse calcification in the medial spleen, possibly from prior granulomatous disease. No discrete adrenal nodules. Right nephrectomy surgical clips are partially visualized. Musculoskeletal: No aggressive appearing focal osseous lesions. Moderate thoracic spondylosis. IMPRESSION: 1. Spiculated apical left upper lobe pulmonary nodule is mildly decreased in size, consistent with partial treatment response. Early postradiation change in the apical left upper lobe. 2. No definite evidence of metastatic disease in the chest. New nonspecific top-normal size prevascular left upper mediastinal lymph nodes measuring up to 0.6 cm, which warrant attention on follow-up chest CT in 3 months. 3. Small stable ground-glass right-sided pulmonary nodules, largest 1.0 cm in the right upper lobe, which warrant continued follow-up on chest CT in two years. 4. Additional findings include aortic atherosclerosis and three-vessel coronary atherosclerosis. Electronically Signed   By: Ilona Sorrel M.D.   On: 07/18/2016 16:35    Impression:  Solitary pulmonary nodule.  She is doing well post SBRT. No evidence of recurrence on clinical exam, recent chest CT shows pulmonary nodule to be smaller without any new problems.   Plan: Follow up in 6 months with chest CT scan and clinical exam.   ____________________________________ -----------------------------------  Blair Promise, PhD, MD  This document serves as a record of services personally performed by Gery Pray, MD. It was created on his behalf by Bethann Humble, a trained medical scribe. The creation of this record is based on the scribe's personal observations and the provider's statements to them. This document has been checked and  approved by the attending provider.

## 2016-08-15 ENCOUNTER — Other Ambulatory Visit: Payer: Self-pay | Admitting: Family Medicine

## 2016-08-15 ENCOUNTER — Telehealth: Payer: Self-pay | Admitting: Oncology

## 2016-08-15 DIAGNOSIS — Z1231 Encounter for screening mammogram for malignant neoplasm of breast: Secondary | ICD-10-CM

## 2016-08-15 NOTE — Telephone Encounter (Addendum)
[name] called and said she is due for her annual mammogram and is wondering if it is safe for her to have it following radiation to her left lung.  Advised her that she can have her mammogram per Dr. Sondra Come.  She then mentioned she is having tingling in her left upper breast that radiated to her under arm.  She said she told Dr. Sondra Come about this at her last appointment.  Advised her that is it OK to have her mammogram and she verbalized understanding and agreement.

## 2016-09-25 ENCOUNTER — Ambulatory Visit
Admission: RE | Admit: 2016-09-25 | Discharge: 2016-09-25 | Disposition: A | Discharge status: Home / Self Care | Payer: Medicare Other | Source: Ambulatory Visit | Attending: Family Medicine | Admitting: Family Medicine

## 2016-09-25 DIAGNOSIS — Z1231 Encounter for screening mammogram for malignant neoplasm of breast: Secondary | ICD-10-CM

## 2016-09-26 ENCOUNTER — Other Ambulatory Visit: Payer: Self-pay | Admitting: Family Medicine

## 2016-09-26 DIAGNOSIS — R928 Other abnormal and inconclusive findings on diagnostic imaging of breast: Secondary | ICD-10-CM

## 2016-10-06 ENCOUNTER — Ambulatory Visit
Admission: RE | Admit: 2016-10-06 | Discharge: 2016-10-06 | Disposition: A | Discharge status: Home / Self Care | Payer: Medicare Other | Source: Ambulatory Visit | Attending: Family Medicine | Admitting: Family Medicine

## 2016-10-06 DIAGNOSIS — R928 Other abnormal and inconclusive findings on diagnostic imaging of breast: Secondary | ICD-10-CM

## 2016-11-19 ENCOUNTER — Other Ambulatory Visit: Payer: Self-pay | Admitting: Family Medicine

## 2016-11-19 DIAGNOSIS — M8580 Other specified disorders of bone density and structure, unspecified site: Secondary | ICD-10-CM

## 2016-11-24 ENCOUNTER — Ambulatory Visit
Admission: RE | Admit: 2016-11-24 | Discharge: 2016-11-24 | Disposition: A | Discharge status: Home / Self Care | Payer: Medicare Other | Source: Ambulatory Visit | Attending: Family Medicine | Admitting: Family Medicine

## 2016-11-24 DIAGNOSIS — M8580 Other specified disorders of bone density and structure, unspecified site: Secondary | ICD-10-CM

## 2017-01-08 ENCOUNTER — Telehealth: Payer: Self-pay | Admitting: *Deleted

## 2017-01-08 NOTE — Telephone Encounter (Signed)
CALLED PATIENT TO INFORM OF CT ON 01-28-17- ARRIVAL TIME- 12:45 PM  @ [facility] , NO RESTRICTIONS TO TEST, AND HIS FU WITH DR. KINARD FOR RESULTS ON 01-29-17, LVM FOR A RETURN CALL

## 2017-01-28 ENCOUNTER — Ambulatory Visit (HOSPITAL_COMMUNITY)
Admission: RE | Admit: 2017-01-28 | Discharge: 2017-01-28 | Disposition: A | Discharge status: Home / Self Care | Payer: Medicare Other | Source: Ambulatory Visit | Attending: Radiation Oncology | Admitting: Radiation Oncology

## 2017-01-28 DIAGNOSIS — Z9889 Other specified postprocedural states: Secondary | ICD-10-CM | POA: Insufficient documentation

## 2017-01-28 DIAGNOSIS — K769 Liver disease, unspecified: Secondary | ICD-10-CM | POA: Insufficient documentation

## 2017-01-28 DIAGNOSIS — R911 Solitary pulmonary nodule: Secondary | ICD-10-CM | POA: Diagnosis present

## 2017-01-28 DIAGNOSIS — R918 Other nonspecific abnormal finding of lung field: Secondary | ICD-10-CM | POA: Insufficient documentation

## 2017-01-29 ENCOUNTER — Ambulatory Visit
Admission: RE | Admit: 2017-01-29 | Discharge: 2017-01-29 | Disposition: A | Discharge status: Home / Self Care | Payer: Medicare Other | Source: Ambulatory Visit | Attending: Radiation Oncology | Admitting: Radiation Oncology

## 2017-01-29 ENCOUNTER — Encounter: Payer: Self-pay | Admitting: Radiation Oncology

## 2017-01-29 ENCOUNTER — Telehealth: Payer: Self-pay | Admitting: Oncology

## 2017-01-29 VITALS — BP 147/88 | HR 82 | Temp 98.6°F | Resp 18 | Wt 172.0 lb

## 2017-01-29 DIAGNOSIS — R911 Solitary pulmonary nodule: Secondary | ICD-10-CM | POA: Diagnosis not present

## 2017-01-29 DIAGNOSIS — Z880 Allergy status to penicillin: Secondary | ICD-10-CM | POA: Insufficient documentation

## 2017-01-29 DIAGNOSIS — Z7901 Long term (current) use of anticoagulants: Secondary | ICD-10-CM | POA: Insufficient documentation

## 2017-01-29 DIAGNOSIS — Z9889 Other specified postprocedural states: Secondary | ICD-10-CM | POA: Diagnosis not present

## 2017-01-29 DIAGNOSIS — Z905 Acquired absence of kidney: Secondary | ICD-10-CM | POA: Insufficient documentation

## 2017-01-29 NOTE — Telephone Encounter (Signed)
Called Dr. Abel Presto office with [facility] and requested last office note and lab work to be faxed to Radiation Oncology.

## 2017-01-29 NOTE — Progress Notes (Signed)
Radiation Oncology         [phone] ________________________________  Name: [name] MRN: [id number]  Date: 01/29/2017  DOB: 07/04/1928  Follow-Up Visit Note  CC: Marylene Land, MD  Derinda Late, MD    ICD-9-CM ICD-10-CM   1. Solitary pulmonary nodule 793.11 R91.1     Diagnosis: Solitary pulmonary nodule in the left upper lobe  Summary and Interval since last radiation: 1 year 2 months 12/04/15 - 12/14/15: Left Upper Lobe treated to 60 Gy in 5 fractions at 12 Gy per fraction (SBRT) (Dr. Pablo Ledger)  Narrative:  The patient returns today for routine follow-up of radiation completed 12/14/15.   The patient had a chest CT scan on 01/28/17 showing a new 3.8 cm low-density lesion in the posterior right liver. Additionally noted was stable appearance of right upper lobe ground-glass nodules. Lesion in the left upper lobe had regressed slightly.  On review of systems, the patient denies any pain. She reports a stable appetite and weight gain. She denies fatigue. The patient denies shortness of breath with exertion. She denies cough and hemoptysis. The patient reports occasional balance issues with dizziness when standing. She has discussed this with her PCP, Dr. Sandi Mariscal.   ALLERGIES:  is allergic to penicillins.  Meds: Current Outpatient Prescriptions  Medication Sig Dispense Refill  . CALCIUM PO Take 1 tablet by mouth 2 (two) times daily.    . celecoxib (CELEBREX) 200 MG capsule take 1 capsule by mouth once daily if needed for NECK PAIN OR ARTHRITIS  0  . gabapentin (NEURONTIN) 100 MG capsule 3 tablets at bedtime    . metoprolol succinate (TOPROL-XL) 25 MG 24 hr tablet Take 0.5 tablets (12.5 mg total) by mouth daily. 45 tablet 3  . Multiple Vitamin (MULTIVITAMIN WITH MINERALS) TABS Take 1 tablet by mouth daily.    . rivaroxaban (XARELTO) 20 MG TABS tablet Take 20 mg by mouth daily with supper.    . rosuvastatin (CRESTOR) 5 MG tablet Take 1 tablet by mouth daily.  0   No  current facility-administered medications for this encounter.     Physical Findings: The patient is in no acute distress. Patient is alert and oriented.  weight is 172 lb (78 kg). Her oral temperature is 98.6 F (37 C). Her blood pressure is 147/88 (abnormal) and her pulse is 82. Her respiration is 18 and oxygen saturation is 100%.   Lungs are clear to auscultation bilaterally. Heart has regular rate and rhythm. No palpable cervical, supraclavicular, or axillary adenopathy. Abdomen soft, non tender. No palpable liver lesion   Lab Findings: Lab Results  Component Value Date   WBC 7.7 02/28/2013   HGB 10.9 (L) 02/28/2013   HCT 32.4 (L) 02/28/2013   MCV 85.9 02/28/2013   PLT 169 02/28/2013    Radiographic Findings: Ct Chest Wo Contrast  Result Date: 01/28/2017 CLINICAL DATA:  Left lung cancer restaging. EXAM: CT CHEST WITHOUT CONTRAST TECHNIQUE: Multidetector CT imaging of the chest was performed following the standard protocol without IV contrast. COMPARISON:  07/18/2016 FINDINGS: Cardiovascular: The heart size is normal. No pericardial effusion. Coronary artery calcification is noted. Atherosclerotic calcification is noted in the wall of the thoracic aorta. Mediastinum/Nodes: Small prevascular lymph nodes measured previously are stable. Index prevascular node on image 41 series 6 measures 5 mm short axis today compared to 6 mm previously. 5 mm short axis prevascular node on image 40 for was 5 mm when I remeasure on the prior study. No mediastinal lymphadenopathy. No evidence for  gross hilar lymphadenopathy although assessment is limited by the lack of intravenous contrast on today's study. The esophagus has normal imaging features. Lungs/Pleura: Lift apical nodule measured previously at 1.3 x 1.3 cm measures 1.3 x 1.1 cm today. There is volume loss in the left apex, new in the interval. Terry nodular ground-glass attenuation seen previously is stable. 9 mm sub solid nodule identified central  right upper lobe (image 57 series 5) is stable. The 10 mm ground-glass nodule measured previously in the anterior right upper lobe is stable. Subsegmental atelectasis or linear scarring in the lung bases is unchanged. Upper Abdomen: 3.8 cm low-density lesion identified posterior right liver, not definitely seen on the prior study. Musculoskeletal: Bone windows reveal no worrisome lytic or sclerotic osseous lesions. IMPRESSION: 1. 3.8 cm low-density lesion in the posterior right liver was not definitely seen on the prior study. Interval development of metastatic disease in the liver parenchyma is a distinct concern. CT abdomen and pelvis with intravenous contrast recommended to further evaluate. 2. No substantial change in the left apical pulmonary nodule although there is some volume loss in the region that is new in the interval. 3. Stable appearance right upper lobe ground-glass nodules. Continued attention on follow-up recommended. 4. Stable small lymph nodes in the upper mediastinum. These results will be called to the ordering clinician or representative by the Radiologist Assistant, and communication documented in the PACS or zVision Dashboard. Electronically Signed   By: Misty Stanley M.D.   On: 01/28/2017 14:56    Impression:  Solitary pulmonary nodule. She is doing well post SBRT. No evidence of recurrence on clinical exam, recent chest CT shows pulmonary nodule to be smaller. Possible new liver lesion,   warranting additional imaging.  Plan: The patient only has 1 kidney; I will discuss with radiology, as the patient may be a better candidate for MRI rather than CT with contrast. If CT is to be performed, the patient will have preceding labs to determine kidney function. The patient may be a candidate for liver ultrasound instead. The patient will be called with the imaging appointment.  I will copy the patient's nephrologist, Dr. Florene Glen, on my note.  I reviewed the patient's chest CT scan with  diagnostic radiology. They recommended an MRI with contrast. The gadolinium contrast will be less of an issue with the patient's one remaining kidney. The patient will return for routine blood work prior to her scan.  -----------------------------------  Blair Promise, PhD, MD  This document serves as a record of services personally performed by Gery Pray, MD. It was created on his behalf by Maryla Morrow, a trained medical scribe. The creation of this record is based on the scribe's personal observations and the provider's statements to them. This document has been checked and approved by the attending provider.

## 2017-01-29 NOTE — Progress Notes (Signed)
[name] is here today for follow up for radiation to left upper lobe that completed on 12/14/2015.  Patient denies any pain.  Patient denies any decrease in appetite, reports is gaining weight and exercising less.  Patient denies any issues with fatigue.  Denies any shortness of breath with exertion.  Denies having a cough.  Patient states she occasionally has balance issues with dizziness when getting up and her primary PCP is aware.    Vitals:   01/29/17 1056  BP: (!) 147/88  Pulse: 82  Resp: 18  Temp: 98.6 F (37 C)  TempSrc: Oral  SpO2: 100%  Weight: 172 lb (78 kg)    Wt Readings from Last 3 Encounters:  01/29/17 172 lb (78 kg)  07/24/16 170 lb 12.8 oz (77.5 kg)  04/10/16 168 lb 9.6 oz (76.5 kg)

## 2017-01-30 ENCOUNTER — Ambulatory Visit
Admission: RE | Admit: 2017-01-30 | Discharge: 2017-01-30 | Disposition: A | Discharge status: Home / Self Care | Payer: Medicare Other | Source: Ambulatory Visit | Attending: Radiation Oncology | Admitting: Radiation Oncology

## 2017-01-30 DIAGNOSIS — R911 Solitary pulmonary nodule: Secondary | ICD-10-CM | POA: Diagnosis not present

## 2017-01-30 LAB — COMPREHENSIVE METABOLIC PANEL
ALK PHOS: 130 U/L (ref 40–150)
ALT: 14 U/L (ref 0–55)
AST: 20 U/L (ref 5–34)
Albumin: 3.5 g/dL (ref 3.5–5.0)
Anion Gap: 6 mEq/L (ref 3–11)
BILIRUBIN TOTAL: 0.43 mg/dL (ref 0.20–1.20)
BUN: 13.2 mg/dL (ref 7.0–26.0)
CHLORIDE: 107 meq/L (ref 98–109)
CO2: 27 mEq/L (ref 22–29)
Calcium: 9.6 mg/dL (ref 8.4–10.4)
Creatinine: 0.9 mg/dL (ref 0.6–1.1)
EGFR: 68 mL/min/{1.73_m2} — AB (ref >90)
GLUCOSE: 82 mg/dL (ref 70–140)
POTASSIUM: 4.2 meq/L (ref 3.5–5.1)
SODIUM: 140 meq/L (ref 136–145)
Total Protein: 7.5 g/dL (ref 6.4–8.3)

## 2017-01-30 LAB — CBC WITH DIFFERENTIAL/PLATELET
BASO%: 1 % (ref 0.0–2.0)
Basophils Absolute: 0.1 10*3/uL (ref 0.0–0.1)
EOS%: 4.6 % (ref 0.0–7.0)
Eosinophils Absolute: 0.3 10*3/uL (ref 0.0–0.5)
HEMATOCRIT: 42.5 % (ref 34.8–46.6)
HGB: 14.1 g/dL (ref 11.6–15.9)
LYMPH#: 1.5 10*3/uL (ref 0.9–3.3)
LYMPH%: 26.4 % (ref 14.0–49.7)
MCH: 29 pg (ref 25.1–34.0)
MCHC: 33.2 g/dL (ref 31.5–36.0)
MCV: 87.4 fL (ref 79.5–101.0)
MONO#: 0.5 10*3/uL (ref 0.1–0.9)
MONO%: 8.6 % (ref 0.0–14.0)
NEUT%: 59.4 % (ref 38.4–76.8)
NEUTROS ABS: 3.3 10*3/uL (ref 1.5–6.5)
Platelets: 196 10*3/uL (ref 145–400)
RBC: 4.86 10*6/uL (ref 3.70–5.45)
RDW: 16 % — ABNORMAL HIGH (ref 11.2–14.5)
WBC: 5.6 10*3/uL (ref 3.9–10.3)

## 2017-02-09 ENCOUNTER — Telehealth: Payer: Self-pay | Admitting: Oncology

## 2017-02-09 ENCOUNTER — Other Ambulatory Visit: Payer: Self-pay | Admitting: Radiation Oncology

## 2017-02-09 DIAGNOSIS — R911 Solitary pulmonary nodule: Secondary | ICD-10-CM

## 2017-02-09 NOTE — Telephone Encounter (Signed)
Patient left a message asking about her lab results from 01/30/17.  She would like a return call.

## 2017-02-10 ENCOUNTER — Telehealth: Payer: Self-pay | Admitting: *Deleted

## 2017-02-10 NOTE — Telephone Encounter (Signed)
CALLED PATIENT TO INFORM OF MRI FOR 02-14-17- ARRIVAL TIME - 11:30 AM, PT. TO BE NPO - 4 HRS. PRIOR TO TEST, TEST TO BE @ [facility], SPOKE WITH PATIENT AND SHE IS AWARE OF THIS TEST

## 2017-02-10 NOTE — Telephone Encounter (Signed)
Patient called back.  Reviewed lab results and informed her that a scheduler will be calling her to set up her MRI as soon as it is approved by her insurance.  [name] verbalized understanding and agreement.

## 2017-02-14 ENCOUNTER — Ambulatory Visit (HOSPITAL_COMMUNITY)
Admission: RE | Admit: 2017-02-14 | Discharge: 2017-02-14 | Disposition: A | Discharge status: Home / Self Care | Payer: Medicare Other | Source: Ambulatory Visit | Attending: Radiation Oncology | Admitting: Radiation Oncology

## 2017-02-14 DIAGNOSIS — R160 Hepatomegaly, not elsewhere classified: Secondary | ICD-10-CM | POA: Insufficient documentation

## 2017-02-14 DIAGNOSIS — R911 Solitary pulmonary nodule: Secondary | ICD-10-CM

## 2017-02-14 MED ORDER — GADOBENATE DIMEGLUMINE 529 MG/ML IV SOLN
20.0000 mL | Freq: Once | INTRAVENOUS | Status: AC | PRN
  Administered 2017-02-14: 16 mL via INTRAVENOUS

## 2017-02-19 ENCOUNTER — Telehealth: Payer: Self-pay | Admitting: Oncology

## 2017-02-19 ENCOUNTER — Other Ambulatory Visit: Payer: Self-pay | Admitting: Radiation Oncology

## 2017-02-19 DIAGNOSIS — R911 Solitary pulmonary nodule: Secondary | ICD-10-CM

## 2017-02-19 NOTE — Telephone Encounter (Signed)
Patient called and asked when the US biopsy will be scheduled.  Advised her that it may be a few days before it is scheduled as the Radiologist will need to review her case.  She verbalized agreement and also requested that copies of any notes and test results be sent to her family practice doctor - Dr. Derinda Late.

## 2017-02-20 ENCOUNTER — Encounter: Payer: Self-pay | Admitting: Radiation Oncology

## 2017-02-20 NOTE — Progress Notes (Signed)
Office notes, Ct scan and MRI sent to Dr Sandi Mariscal @ [phone], per Dr Sondra Come, confirmation received 02/19/17

## 2017-02-23 ENCOUNTER — Telehealth: Payer: Self-pay | Admitting: Oncology

## 2017-02-23 NOTE — Telephone Encounter (Signed)
Mickel Baas from Dr. Deboraha Sprang office called back and said it was OK to hold Xarelto the day before patient's US biopsy.  She also asked how long patient would need to hold the Xarelto.  Advised her that we will check with Interventional Radiology and call back.

## 2017-02-23 NOTE — Telephone Encounter (Signed)
Called Dr. Deboraha Sprang office and spoke to his nurse, Mickel Baas.  Asked if patient can hold her Xarelto for a day before she has an US biopsy of her liver.  Mickel Baas said she will check and call us back.

## 2017-02-24 NOTE — Telephone Encounter (Signed)
Called Mickel Baas back and advised her that patient will need to hold her Xarelto the day before her liver biopsy and can restart it 24 hours after the biopsy.  Advised her that it has been scheduled for 03/03/17.

## 2017-02-25 ENCOUNTER — Telehealth: Payer: Self-pay | Admitting: Oncology

## 2017-02-25 NOTE — Telephone Encounter (Signed)
Received call from [facility] with Dr. Deboraha Sprang office.  She verified that Dr. Sandi Mariscal has agreed to patient not taking Xarelto 1 day before the biopsy and resuming it 24 hours after if she does not have any bleeding.  Called patient and verified this information.  [name] verbalized understanding and agreement.

## 2017-03-02 ENCOUNTER — Other Ambulatory Visit: Payer: Self-pay | Admitting: Radiology

## 2017-03-03 ENCOUNTER — Encounter (HOSPITAL_COMMUNITY): Payer: Self-pay

## 2017-03-03 ENCOUNTER — Ambulatory Visit (HOSPITAL_COMMUNITY)
Admission: RE | Admit: 2017-03-03 | Discharge: 2017-03-03 | Disposition: A | Discharge status: Home / Self Care | Payer: Medicare Other | Source: Ambulatory Visit | Attending: Radiation Oncology | Admitting: Radiation Oncology

## 2017-03-03 DIAGNOSIS — C229 Malignant neoplasm of liver, not specified as primary or secondary: Secondary | ICD-10-CM | POA: Insufficient documentation

## 2017-03-03 DIAGNOSIS — R911 Solitary pulmonary nodule: Secondary | ICD-10-CM | POA: Diagnosis present

## 2017-03-03 DIAGNOSIS — Z85118 Personal history of other malignant neoplasm of bronchus and lung: Secondary | ICD-10-CM | POA: Insufficient documentation

## 2017-03-03 LAB — CBC
HEMATOCRIT: 41.5 % (ref 36.0–46.0)
HEMOGLOBIN: 14 g/dL (ref 12.0–15.0)
MCH: 29.3 pg (ref 26.0–34.0)
MCHC: 33.7 g/dL (ref 30.0–36.0)
MCV: 86.8 fL (ref 78.0–100.0)
Platelets: 224 10*3/uL (ref 150–400)
RBC: 4.78 MIL/uL (ref 3.87–5.11)
RDW: 15 % (ref 11.5–15.5)
WBC: 6.9 10*3/uL (ref 4.0–10.5)

## 2017-03-03 LAB — APTT: aPTT: 37 seconds — ABNORMAL HIGH (ref 24–36)

## 2017-03-03 LAB — PROTIME-INR
INR: 1.17
Prothrombin Time: 14.9 seconds (ref 11.4–15.2)

## 2017-03-03 MED ORDER — HYDROCODONE-ACETAMINOPHEN 5-325 MG PO TABS: 1.0000 | ORAL_TABLET | ORAL | Status: DC | PRN

## 2017-03-03 MED ORDER — FENTANYL CITRATE (PF) 100 MCG/2ML IJ SOLN
INTRAMUSCULAR | Status: AC | PRN
  Administered 2017-03-03: 25 ug via INTRAVENOUS

## 2017-03-03 MED ORDER — SODIUM CHLORIDE 0.9 % IV SOLN
INTRAVENOUS | Status: DC
  Administered 2017-03-03: 11:00:00 via INTRAVENOUS

## 2017-03-03 MED ORDER — MIDAZOLAM HCL 2 MG/2ML IJ SOLN
INTRAMUSCULAR | Status: AC
  Filled 2017-03-03: qty 6

## 2017-03-03 MED ORDER — FENTANYL CITRATE (PF) 100 MCG/2ML IJ SOLN
INTRAMUSCULAR | Status: AC
  Filled 2017-03-03: qty 4

## 2017-03-03 MED ORDER — MIDAZOLAM HCL 2 MG/2ML IJ SOLN
INTRAMUSCULAR | Status: AC | PRN
  Administered 2017-03-03 (×2): 1 mg via INTRAVENOUS

## 2017-03-03 NOTE — Consult Note (Signed)
Chief Complaint: Patient was seen in consultation today for image guided liver lesion biopsy  Referring Physician(s): Kinard,James  Supervising Physician: Arne Cleveland  Patient Status: [facility] - Out-pt  History of Present Illness: [name] is a 83 y.o. female , former smoker, with prior history of non-small cell cancer of the left lung, status post radiation treatment only (no prior biopsy or chemotherapy or surgery). Restaging imaging recently revealed a 4 cm hypovascular mass in the posterior right hepatic lobe suspicious for metastasis. She presents today for image guided liver lesion biopsy for further evaluation.  Past Medical History:  Diagnosis Date  . Arthritis    Neck,shoulder  . Colonic polyp   . Difficulty sleeping   . History of radiation therapy 12/04/15-12/14/15   left upper lobe 60 Gy  . Hx of unilateral nephrectomy 1994   right   . Hyperlipemia   . Lung cancer ([place])   . Numbness in feet   . Peripheral neuropathy   . Personal history of PE (pulmonary embolism) 2011    Past Surgical History:  Procedure Laterality Date  . ABDOMINAL HYSTERECTOMY    . APPENDECTOMY    . cataracts Bilateral 09/2014   removal  . CESAREAN SECTION    . NEPHRECTOMY  1993   RT -   . TOTAL HIP ARTHROPLASTY Right 02/25/2013   Procedure: RIGHT TOTAL HIP ARTHROPLASTY ANTERIOR APPROACH;  Surgeon: Mcarthur Rossetti, MD;  Location: [facility] ORS;  Service: Orthopedics;  Laterality: Right;    Allergies: Penicillins  Medications: Prior to Admission medications   Medication Sig Start Date End Date Taking? Authorizing Provider  CALCIUM PO Take 1 tablet by mouth 2 (two) times daily.   Yes [provider]  gabapentin (NEURONTIN) 100 MG capsule 3 tablets at bedtime   Yes [provider]  metoprolol succinate (TOPROL-XL) 25 MG 24 hr tablet Take 0.5 tablets (12.5 mg total) by mouth daily. 06/19/15  Yes Jerline Pain, MD  Multiple Vitamin (MULTIVITAMIN WITH MINERALS)  TABS Take 1 tablet by mouth daily.   Yes [provider]  rosuvastatin (CRESTOR) 5 MG tablet Take 1 tablet by mouth daily. 01/11/17  Yes [provider]  celecoxib (CELEBREX) 200 MG capsule take 1 capsule by mouth once daily if needed for NECK PAIN OR ARTHRITIS 11/30/15   [provider]  rivaroxaban (XARELTO) 20 MG TABS tablet Take 20 mg by mouth daily with supper.    [provider]     Family History  Problem Relation Age of Onset  . Hypertension Father   . Diabetes Father   . Hypertension Mother   . Colon cancer Sister     Social History   Social History  . Marital status: Widowed    Spouse name: N/A  . Number of children: 2  . Years of education: masters   Occupational History  . 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 Smoking status: Former Smoker    Packs/day: 0.25    Years: 20.00    Types: Cigarettes    Quit date: 10/20/1989  . Smokeless tobacco: Never Used  . Alcohol use No  . Drug use: No  . Sexual activity: Not Asked   Other Topics Concern  . None   Social History Narrative   Patient lives at home alone . - Widow.   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   Caffeine- One cup daily.  Review of Systems currently denies fever, headache, chest pain, dyspnea, cough, abdominal pain, back pain, nausea, vomiting or abnormal bleeding. She does have bilateral lower extremity neuropathy.  Vital Signs: BP (!) 143/80   Pulse 83   Temp 98.1 F (36.7 C) (Oral)   Resp 18   Ht '5\' 7"'$  (1.702 m)   Wt 168 lb 9.6 oz (76.5 kg)   SpO2 98%   BMI 26.41 kg/m   Physical Exam awake, alert. Chest clear to auscultation bilaterally. Heart with regular rate and rhythm. Abdomen soft, positive bowel sounds, nontender. Lower extremities with trace to 1+ edema bilaterally.  Mallampati Score:     Imaging: Mr Abdomen Wwo Contrast  Result Date: 02/14/2017 CLINICAL DATA:  Indeterminate right hepatic lobe lesion  on recent CT. Left lung carcinoma. EXAM: MRI ABDOMEN WITHOUT AND WITH CONTRAST TECHNIQUE: Multiplanar multisequence MR imaging of the abdomen was performed both before and after the administration of intravenous contrast. CONTRAST:  31m MULTIHANCE GADOBENATE DIMEGLUMINE 529 MG/ML IV SOLN COMPARISON:  Chest CT on 01/28/2017 FINDINGS: Lower chest: No acute findings. Hepatobiliary: Hypovascular mass in the posterior right hepatic lobe shows mild T2 hyperintensity and peripheral contrast enhancement, highly suspicious for liver metastasis. This measures 4.0 x 3.5 cm on image 42/901. No other liver masses are identified. Gallbladder is unremarkable. No evidence of biliary ductal dilatation. Pancreas:  No mass or inflammatory changes. Spleen:  Within normal limits in size and appearance. Adrenals/Urinary Tract: Both adrenal glands are normal in appearance. Prior right nephrectomy. Normal appearance of left kidney. No evidence of hydronephrosis. Stomach/Bowel: Visualized portions within the abdomen are unremarkable. Vascular/Lymphatic: No pathologically enlarged lymph nodes identified. No abdominal aortic aneurysm. Other:  None. Musculoskeletal:  No suspicious bone lesions identified. IMPRESSION: 4 cm hypovascular mass in posterior right hepatic lobe, highly suspicious for liver metastasis. No other sites of metastatic disease identified within the abdomen. Electronically Signed   By: JEarle GellM.D.   On: 02/14/2017 13:40    Labs:  CBC:  Recent Labs  01/30/17 1003 03/03/17 1112  WBC 5.6 6.9  HGB 14.1 14.0  HCT 42.5 41.5  PLT 196 224    COAGS: No results for input(s): INR, APTT in the last 8760 hours.  BMP:  Recent Labs  01/30/17 1003  NA 140  K 4.2  CO2 27  GLUCOSE 82  BUN 13.2  CALCIUM 9.6  CREATININE 0.9    LIVER FUNCTION TESTS:  Recent Labs  01/30/17 1003  BILITOT 0.43  AST 20  ALT 14  ALKPHOS 130  PROT 7.5  ALBUMIN 3.5    TUMOR MARKERS: No results for input(s): AFPTM,  CEA, CA199, CHROMGRNA in the last 8760 hours.  Assessment and Plan: 83y.o. female , former smoker, with prior history of non-small cell cancer of the left lung, status post radiation treatment only (no prior biopsy or chemotherapy or surgery per pt). Restaging imaging recently revealed a 4 cm hypovascular mass in the posterior right hepatic lobe suspicious for metastasis. She presents today for image guided liver lesion biopsy for further evaluation.Risks and benefits discussed with the patient/son including, but not limited to bleeding, infection, damage to adjacent structures or low yield requiring additional tests. All of the patient's questions were answered, patient is agreeable to proceed. Consent signed and in chart.    Thank you for this interesting consult.  I greatly enjoyed meeting HEMMIE FRAKESand look forward to participating in their care.  A copy of this report was sent to the requesting provider  on this date.  Electronically Signed: D. Nash Mantis 03/03/2017, 11:42 AM   I spent a total of  25 minutes   in face to face in clinical consultation, greater than 50% of which was counseling/coordinating care for image guided liver lesion biopsy

## 2017-03-03 NOTE — Procedures (Signed)
[place] core liver lesion 18g x2 to surg path No complication No blood loss. See complete dictation in [facility].

## 2017-03-03 NOTE — Discharge Instructions (Signed)
Liver Biopsy, Care After These instructions give you information on caring for yourself after your procedure. Your doctor may also give you more specific instructions. Call your doctor if you have any problems or questions after your procedure. Follow these instructions at home:  Rest at home for 1-2 days or as told by your doctor.  Have someone stay with you for at least 24 hours.  Do not do these things in the first 24 hours:  Drive.  Use machinery.  Take care of other people.  Sign legal documents.  Take a bath or shower.  There are many different ways to close and cover a cut (incision). For example, a cut can be closed with stitches, skin glue, or adhesive strips. Follow your doctor's instructions on:  Taking care of your cut.  Changing and removing your bandage (dressing).  Removing whatever was used to close your cut.  Do not drink alcohol in the first week.  Do not lift more than 5 pounds or play contact sports for the first 2 weeks.  Take medicines only as told by your doctor. For 1 week, do not take medicine that has aspirin in it.  Get your test results. Contact a doctor if:  A cut bleeds and leaves more than just a small spot of blood.  A cut is red, puffs up (swells), or hurts more than before.  Fluid or something else comes from a cut.  A cut smells bad.  You have a fever or chills. Get help right away if:  You have swelling, bloating, or pain in your belly (abdomen).  You get dizzy or faint.  You have a rash.  You feel sick to your stomach (nauseous) or throw up (vomit).  You have trouble breathing, feel short of breath, or feel faint.  Your chest hurts.  You have problems talking or seeing.  You have trouble balancing or moving your arms or legs. This information is not intended to replace advice given to you by your health care provider. Make sure you discuss any questions you have with your health care provider. Document Released:  07/15/2008 Document Revised: 03/13/2016 Document Reviewed: 12/02/2013 Elsevier Interactive Patient Education  2017 [place].    Moderate Conscious Sedation, Adult, Care After These instructions provide you with information about caring for yourself after your procedure. Your health care provider may also give you more specific instructions. Your treatment has been planned according to current medical practices, but problems sometimes occur. Call your health care provider if you have any problems or questions after your procedure. What can I expect after the procedure? After your procedure, it is common:  To feel sleepy for several hours.  To feel clumsy and have poor balance for several hours.  To have poor judgment for several hours.  To vomit if you eat too soon. Follow these instructions at home: For at least 24 hours after the procedure:    Do not:  Participate in activities where you could fall or become injured.  Drive.  Use heavy machinery.  Drink alcohol.  Take sleeping pills or medicines that cause drowsiness.  Make important decisions or sign legal documents.  Take care of children on your own.  Rest. Eating and drinking   Follow the diet recommended by your health care provider.  If you vomit:  Drink water, juice, or soup when you can drink without vomiting.  Make sure you have little or no nausea before eating solid foods. General instructions   Have a  responsible adult stay with you until you are awake and alert.  Take over-the-counter and prescription medicines only as told by your health care provider.  If you smoke, do not smoke without supervision.  Keep all follow-up visits as told by your health care provider. This is important. Contact a health care provider if:  You keep feeling nauseous or you keep vomiting.  You feel light-headed.  You develop a rash.  You have a fever. Get help right away if:  You have trouble  breathing. This information is not intended to replace advice given to you by your health care provider. Make sure you discuss any questions you have with your health care provider. Document Released: 07/27/2013 Document Revised: 03/10/2016 Document Reviewed: 01/26/2016 Elsevier Interactive Patient Education  2017 [employer].

## 2017-03-06 ENCOUNTER — Telehealth: Payer: Self-pay | Admitting: Oncology

## 2017-03-06 ENCOUNTER — Encounter: Payer: Self-pay | Admitting: *Deleted

## 2017-03-06 NOTE — Progress Notes (Signed)
Oncology Nurse Navigator Documentation  Oncology Nurse Navigator Flowsheets 03/06/2017  Navigator Location [facility]  Referral date to RadOnc/MedOnc 03/06/2017  Navigator Encounter Type Other/I received referral on [name] today.  I called pathology to see if molecular testing can be completed on recent biopsy per Dr. Julien Nordmann.  After I am updated, she can be scheduled.   Treatment Phase Pre-Tx/Tx Discussion

## 2017-03-06 NOTE — Telephone Encounter (Signed)
Left a message for Norton Blizzard, RN regarding scheduling patient to see Dr. Julien Nordmann for liver biopsy from 03/03/17 showing adenocarcinoma.

## 2017-03-09 ENCOUNTER — Telehealth: Payer: Self-pay | Admitting: *Deleted

## 2017-03-09 ENCOUNTER — Encounter: Payer: Self-pay | Admitting: *Deleted

## 2017-03-09 DIAGNOSIS — R160 Hepatomegaly, not elsewhere classified: Secondary | ICD-10-CM | POA: Insufficient documentation

## 2017-03-09 NOTE — Telephone Encounter (Signed)
Ms. Walrond called me back and left me a vm message. I called her back and updated her on appt on 03/18/17 with labs at 1:45 and Dr. Julien Nordmann at 2:15. She verbalized understanding of appt time and place.

## 2017-03-09 NOTE — Telephone Encounter (Signed)
Oncology Nurse Navigator Documentation  Oncology Nurse Navigator Flowsheets 03/09/2017  Navigator Location [facility]  Navigator Encounter Type Telephone/Patient has not been scheduled yet.  I called patient to schedule. I was unable to reach her. I left vm message with my name and phone number to call.    Telephone Outgoing Call  Treatment Phase Pre-Tx/Tx Discussion  Barriers/Navigation Needs Coordination of Care  Interventions Coordination of Care  Acuity Level 2  Time Spent with Patient 30

## 2017-03-09 NOTE — Progress Notes (Signed)
Oncology Nurse Navigator Documentation  Oncology Nurse Navigator Flowsheets 03/09/2017  Navigator Location [facility]  Navigator Encounter Type Other/I received an update from [facility] pathology dept.  Molecular testing will be sent out today or first thing tomorrow.   Treatment Phase Pre-Tx/Tx Discussion  Barriers/Navigation Needs Coordination of Care  Interventions Coordination of Care  Acuity Level 1  Time Spent with Patient 15

## 2017-03-18 ENCOUNTER — Ambulatory Visit (HOSPITAL_BASED_OUTPATIENT_CLINIC_OR_DEPARTMENT_OTHER): Payer: Medicare Other | Admitting: Internal Medicine

## 2017-03-18 ENCOUNTER — Telehealth: Payer: Self-pay | Admitting: Internal Medicine

## 2017-03-18 ENCOUNTER — Other Ambulatory Visit (HOSPITAL_BASED_OUTPATIENT_CLINIC_OR_DEPARTMENT_OTHER): Payer: Medicare Other

## 2017-03-18 ENCOUNTER — Encounter: Payer: Self-pay | Admitting: Internal Medicine

## 2017-03-18 DIAGNOSIS — C3412 Malignant neoplasm of upper lobe, left bronchus or lung: Secondary | ICD-10-CM

## 2017-03-18 DIAGNOSIS — C787 Secondary malignant neoplasm of liver and intrahepatic bile duct: Secondary | ICD-10-CM

## 2017-03-18 DIAGNOSIS — C3492 Malignant neoplasm of unspecified part of left bronchus or lung: Secondary | ICD-10-CM

## 2017-03-18 DIAGNOSIS — R160 Hepatomegaly, not elsewhere classified: Secondary | ICD-10-CM

## 2017-03-18 HISTORY — DX: Malignant neoplasm of unspecified part of left bronchus or lung: C34.92

## 2017-03-18 LAB — CBC WITH DIFFERENTIAL/PLATELET
BASO%: 1.3 % (ref 0.0–2.0)
Basophils Absolute: 0.1 10*3/uL (ref 0.0–0.1)
EOS%: 4.2 % (ref 0.0–7.0)
Eosinophils Absolute: 0.4 10*3/uL (ref 0.0–0.5)
HCT: 43 % (ref 34.8–46.6)
HGB: 14.1 g/dL (ref 11.6–15.9)
LYMPH%: 25.8 % (ref 14.0–49.7)
MCH: 29.2 pg (ref 25.1–34.0)
MCHC: 32.9 g/dL (ref 31.5–36.0)
MCV: 88.6 fL (ref 79.5–101.0)
MONO#: 0.8 10*3/uL (ref 0.1–0.9)
MONO%: 8.3 % (ref 0.0–14.0)
NEUT%: 60.4 % (ref 38.4–76.8)
NEUTROS ABS: 5.6 10*3/uL (ref 1.5–6.5)
PLATELETS: 244 10*3/uL (ref 145–400)
RBC: 4.85 10*6/uL (ref 3.70–5.45)
RDW: 14.9 % — ABNORMAL HIGH (ref 11.2–14.5)
WBC: 9.2 10*3/uL (ref 3.9–10.3)
lymph#: 2.4 10*3/uL (ref 0.9–3.3)

## 2017-03-18 LAB — COMPREHENSIVE METABOLIC PANEL
ALT: 17 U/L (ref 0–55)
ANION GAP: 9 meq/L (ref 3–11)
AST: 25 U/L (ref 5–34)
Albumin: 3.7 g/dL (ref 3.5–5.0)
Alkaline Phosphatase: 159 U/L — ABNORMAL HIGH (ref 40–150)
BUN: 19.5 mg/dL (ref 7.0–26.0)
CHLORIDE: 105 meq/L (ref 98–109)
CO2: 26 meq/L (ref 22–29)
Calcium: 9.6 mg/dL (ref 8.4–10.4)
Creatinine: 1.1 mg/dL (ref 0.6–1.1)
EGFR: 55 mL/min/{1.73_m2} — AB (ref >90)
GLUCOSE: 97 mg/dL (ref 70–140)
Potassium: 4.5 mEq/L (ref 3.5–5.1)
SODIUM: 140 meq/L (ref 136–145)
Total Bilirubin: 0.41 mg/dL (ref 0.20–1.20)
Total Protein: 7.8 g/dL (ref 6.4–8.3)

## 2017-03-18 NOTE — Telephone Encounter (Signed)
Gave patient AVS and calender per 5/30 LOS. Central radiology to contact patient with PET and MRI schedule.

## 2017-03-18 NOTE — Progress Notes (Signed)
[place] [phone]   [phone]  CONSULT NOTE  REFERRING PHYSICIAN: Dr. Gery Pray.  REASON FOR CONSULTATION:  83 years old African-American female with metastatic lung cancer.  HPI [name] is a 83 y.o. female was past medical history significant for osteoarthritis, peripheral neuropathy, pulmonary embolism in 2014, hypertension as well as the remote history of smoking. The patient was seen by her primary care physician in December 2016 and during her physical exam he noticed some abnormal breath sounds. Chest x-ray was performed on 10/05/2015 and it showed enlarging left apical nodule concerning for malignancy. This was followed by CT scan of the chest with contrast on 10/10/2015 and it showed 1.9 cm spiculated mass at the left apex highly suspicious for carcinoma. There are 2 areas of groundglass opacity in the right lung including 1 in the right upper lobe measuring 0.9 cm and the other in the inferior right upper lobe measuring 0.7 cm. A PET scan was performed on 10/30/2015 and it showed hypermetabolic nodule in the left upper lobe measuring 1.8 cm with SUV of 10.8. The smaller groundglass opacities were not hypermetabolic. There is no other evidence of metastatic disease in the neck, chest, abdomen or pelvis. The patient underwent stereotactic body radiotherapy to the left upper lobe lung nodule under the care of Dr. Pablo Ledger without biopsy completed 12/14/2015. The patient was monitoring by imaging studies and repeat CT scan of the chest every 6 months. CT scan of the chest on 01/28/2017 showed 2.8 cm low density lesion in the posterior right liver that was not definitely seen on the prior study concerning for metastatic disease. There was no substantial change in the left apical pulmonary nodule and there was a stable appearance on the right upper lobe groundglass nodules and a stable small lymph nodes in the upper mediastinum. MRI of the  abdomen on 02/14/2017 showed hypervascular mass in the posterior right hepatic lobe with mild T2 hyper intensity and peripheral contrast enhancement highly suspicious for liver metastases and measured 4.0 x 3.5 cm. No other liver masses were identified. On 03/03/2017 the patient underwent ultrasound-guided core biopsy of the right lobe liver lesion by interventional radiology. The final pathology ([id number]) showed metastatic adenocarcinoma of lung primary, positive for TTF-1 and Napsin-A. Dr. Sondra Come kindly referred the patient to me today for further evaluation and recommendation regarding treatment of her condition. When seen today, the patient has no significant complaints. She denied having any chest pain, shortness breath, cough or hemoptysis. She denied having any weight loss or night sweats. She has no nausea, vomiting, diarrhea or constipation. She denied having any headache or visual changes. Family history significant for mother and father with hypertension. A sister with colon cancer and another sister with lung cancer. The patient is a widow and she has 1 living son, [name]. She used to work as a Pharmacist, hospital. She has a history of smoking less than one pack per day for around 20 years but quit more than 30 years ago. She has no history of alcohol or drug abuse.  HPI  Past Medical History:  Diagnosis Date  . Arthritis    Neck,shoulder  . Colonic polyp   . Difficulty sleeping   . History of radiation therapy 12/04/15-12/14/15   left upper lobe 60 Gy  . Hx of unilateral nephrectomy 1994   right   . Hyperlipemia   . Lung cancer ([place])   . Numbness in feet   . Peripheral neuropathy   .  Personal history of PE (pulmonary embolism) 2011    Past Surgical History:  Procedure Laterality Date  . ABDOMINAL HYSTERECTOMY    . APPENDECTOMY    . cataracts Bilateral 09/2014   removal  . CESAREAN SECTION    . NEPHRECTOMY  1993   RT -   . TOTAL HIP ARTHROPLASTY Right 02/25/2013   Procedure: RIGHT  TOTAL HIP ARTHROPLASTY ANTERIOR APPROACH;  Surgeon: Mcarthur Rossetti, MD;  Location: [facility];  Service: Orthopedics;  Laterality: Right;    Family History  Problem Relation Age of Onset  . Hypertension Father   . Diabetes Father   . Hypertension Mother   . Colon cancer Sister     Social History Social History  Substance Use Topics  . Smoking status: Former Smoker    Packs/day: 0.25    Years: 20.00    Types: Cigarettes    Quit date: 10/20/1989  . Smokeless tobacco: Never Used  . Alcohol use No    Allergies  Allergen Reactions  . Penicillins Rash    Current Outpatient Prescriptions  Medication Sig Dispense Refill  . CALCIUM PO Take 1 tablet by mouth 2 (two) times daily.    . celecoxib (CELEBREX) 200 MG capsule take 1 capsule by mouth once daily if needed for NECK PAIN OR ARTHRITIS  0  . gabapentin (NEURONTIN) 100 MG capsule 3 tablets at bedtime    . metoprolol succinate (TOPROL-XL) 25 MG 24 hr tablet Take 0.5 tablets (12.5 mg total) by mouth daily. 45 tablet 3  . Multiple Vitamin (MULTIVITAMIN WITH MINERALS) TABS Take 1 tablet by mouth daily.    . rivaroxaban (XARELTO) 20 MG TABS tablet Take 20 mg by mouth daily with supper.    . rosuvastatin (CRESTOR) 5 MG tablet Take 1 tablet by mouth daily.  0   No current facility-administered medications for this visit.     Review of Systems  Constitutional: negative Eyes: negative Ears, nose, mouth, throat, and face: negative Respiratory: negative Cardiovascular: negative Gastrointestinal: negative Genitourinary:negative Integument/breast: negative Hematologic/lymphatic: negative Musculoskeletal:negative Neurological: negative Behavioral/Psych: negative Endocrine: negative Allergic/Immunologic: negative  Physical Exam  VVO:HYWVP, healthy, no distress, well nourished and well developed SKIN: skin color, texture, turgor are normal, no rashes or significant lesions HEAD: Normocephalic, No masses, lesions, tenderness  or abnormalities EYES: normal, PERRLA, Conjunctiva are pink and non-injected EARS: External ears normal, Canals clear OROPHARYNX:no exudate, no erythema and lips, buccal mucosa, and tongue normal  NECK: supple, no adenopathy, no bruits LYMPH:  no palpable lymphadenopathy, no hepatosplenomegaly BREAST:not examined LUNGS: clear to auscultation , and palpation HEART: regular rate & rhythm, no murmurs and no gallops ABDOMEN:abdomen soft, non-tender, normal bowel sounds and no masses or organomegaly BACK: No CVA tenderness, Range of motion is normal EXTREMITIES:no joint deformities, effusion, or inflammation, no edema, no skin discoloration  NEURO: alert & oriented x 3 with fluent speech, no focal motor/sensory deficits  PERFORMANCE STATUS: ECOG 1  LABORATORY DATA: Lab Results  Component Value Date   WBC 9.2 03/18/2017   HGB 14.1 03/18/2017   HCT 43.0 03/18/2017   MCV 88.6 03/18/2017   PLT 244 03/18/2017      Chemistry      Component Value Date/Time   NA 140 01/30/2017 1003   K 4.2 01/30/2017 1003   CL 105 02/26/2013 0553   CO2 27 01/30/2017 1003   BUN 13.2 01/30/2017 1003   CREATININE 0.9 01/30/2017 1003      Component Value Date/Time   CALCIUM 9.6 01/30/2017 1003  ALKPHOS 130 01/30/2017 1003   AST 20 01/30/2017 1003   ALT 14 01/30/2017 1003   BILITOT 0.43 01/30/2017 1003       RADIOGRAPHIC STUDIES: US Biopsy  Result Date: 28-Mar-2017 CLINICAL DATA:  New liver lesion.  History of lung carcinoma. EXAM: ULTRASOUND GUIDED CORE BIOPSY OF RIGHT LOBE LIVER LESION MEDICATIONS: Intravenous Fentanyl and Versed were administered as conscious sedation during continuous monitoring of the patient's level of consciousness and physiological / cardiorespiratory status by the radiology RN, with a total moderate sedation time of 6 minutes. PROCEDURE: The procedure, risks, benefits, and alternatives were explained to the patient. Questions regarding the procedure were encouraged and  answered. The patient understands and consents to the procedure. Survey ultrasound of the liver was performed. The lesion was localized an appropriate skin entry site was determined and marked. The operative field was prepped with chlorhexidine in a sterile fashion, and a sterile drape was applied covering the operative field. A sterile gown and sterile gloves were used for the procedure. Local anesthesia was provided with 1% Lidocaine. Under real-time ultrasound guidance, a 17 gauge trocar needle was advanced to the margin of the lesion. Once needle tip position was confirmed, coaxial 18-gauge core biopsy samples were obtained, submitted in formalin to surgical pathology. The guide needle was removed. Postprocedure scans show no hemorrhage or other apparent complication. The patient tolerated the procedure well. COMPLICATIONS: None. FINDINGS: Nearly isoechoic right lobe liver lesion was localized. Representative 18 gauge core biopsy samples were obtained. IMPRESSION: 1. Technically successful ultrasound-guided core biopsy, liver lesion. Electronically Signed   By: Lucrezia Europe M.D.   On: 2017-03-28 17:11    ASSESSMENT: This is a very pleasant 83 years old African American female with metastatic non-small cell lung cancer, adenocarcinoma initially diagnosed as a stage IA presented with left upper lobe lung nodule status post SBRT in February 2017 and has been observation since that time but the recent imaging studies showed metastatic disease in the liver.   PLAN: I had a lengthy discussion with the patient and her son today about her current disease stage, prognosis and treatment options. I personally and independently reviewed the imaging studies and discuss the results and showed the images to the patient and her son. I recommended for the patient to complete the staging workup by ordering a PET scan as well as MRI of the brain to rule out any other metastatic disease. I will also request the tissue block  to be sent to [facility] one for molecular studies and PDL 1 expression. I explained to the patient that she has incurable condition and on the treatment will be of palliative nature. I gave the patient the option of palliative care versus palliative systemic chemotherapy versus treatment with targeted therapy or immunotherapy she has the appropriate marker. The patient would definitely consider treatment with targeted therapy or immunotherapy if she has a targeted mutation or high PDL 1 expression. I will arrange for the patient a follow-up appointment with me in 3 weeks for reevaluation and discussion of her treatment options based on the molecular studies when it becomes available. She is currently asymptomatic and she would like to wait. She was advised to call immediately if she has any concerning symptoms in the interval. The patient voices understanding of current disease status and treatment options and is in agreement with the current care plan. All questions were answered. The patient knows to call the clinic with any problems, questions or concerns. We can certainly see the patient  much sooner if necessary. Thank you so much for allowing me to participate in the care of [name]. I will continue to follow up the patient with you and assist in her care. I spent 40 minutes counseling the patient face to face. The total time spent in the appointment was 60 minutes.  Disclaimer: This note was dictated with voice recognition software. Similar sounding words can inadvertently be transcribed and may not be corrected upon review.   Zandrea Kenealy K. Mar 18, 2017, 2:06 PM

## 2017-03-27 ENCOUNTER — Telehealth: Payer: Self-pay | Admitting: Medical Oncology

## 2017-03-27 NOTE — Telephone Encounter (Signed)
Pt instructed NPO after midnight for pet , mri and to take meds after scans

## 2017-03-30 ENCOUNTER — Telehealth: Payer: Self-pay | Admitting: Internal Medicine

## 2017-03-30 ENCOUNTER — Encounter: Payer: Self-pay | Admitting: *Deleted

## 2017-03-30 NOTE — Telephone Encounter (Signed)
Scheduled appt per sch message from [place] - patient aware of appt date and time.

## 2017-03-30 NOTE — Progress Notes (Signed)
Oncology Nurse Navigator Documentation  Oncology Nurse Navigator Flowsheets 03/30/2017  Navigator Location [facility]  Navigator Encounter Type Other/per Dr. Julien Nordmann, he would like patient's appt with him to be re-scheduled with him on 04/06/17 at 1:45 with labs before.  I updated scheduling to cancel appt with Dr. Julien Nordmann on 04/09/17 and re-scheduled for 04/06/17.    Treatment Phase Pre-Tx/Tx Discussion  Barriers/Navigation Needs Coordination of Care  Interventions Coordination of Care  Acuity Level 2  Time Spent with Patient 30

## 2017-03-31 ENCOUNTER — Encounter (HOSPITAL_COMMUNITY): Payer: Self-pay

## 2017-04-02 ENCOUNTER — Encounter (HOSPITAL_COMMUNITY)
Admission: RE | Admit: 2017-04-02 | Discharge: 2017-04-02 | Disposition: A | Discharge status: Home / Self Care | Payer: Medicare Other | Source: Ambulatory Visit | Attending: Internal Medicine | Admitting: Internal Medicine

## 2017-04-02 ENCOUNTER — Encounter: Payer: Self-pay | Admitting: *Deleted

## 2017-04-02 DIAGNOSIS — C3492 Malignant neoplasm of unspecified part of left bronchus or lung: Secondary | ICD-10-CM

## 2017-04-02 LAB — GLUCOSE, CAPILLARY: Glucose-Capillary: 108 mg/dL — ABNORMAL HIGH (ref 65–99)

## 2017-04-02 MED ORDER — SODIUM FLUORIDE F-18
Freq: Once | INTRAVENOUS | Status: AC
  Administered 2017-04-02: 8.46 via INTRAVENOUS
  Filled 2017-04-02: qty 30

## 2017-04-02 MED ORDER — GADOBENATE DIMEGLUMINE 529 MG/ML IV SOLN
15.0000 mL | Freq: Once | INTRAVENOUS | Status: AC | PRN
  Administered 2017-04-02: 15 mL via INTRAVENOUS

## 2017-04-02 NOTE — Progress Notes (Signed)
Oncology Nurse Navigator Documentation  Oncology Nurse Navigator Flowsheets 04/02/2017  Navigator Location [facility]  Navigator Encounter Type Other/desk RN updated me on [name]'s MRI brain scan. I update Dr. Sondra Come.   Treatment Phase Abnormal Scans  Barriers/Navigation Needs Coordination of Care  Interventions Coordination of Care  Acuity Level 2  Time Spent with Patient 15

## 2017-04-06 ENCOUNTER — Other Ambulatory Visit: Payer: Self-pay

## 2017-04-06 ENCOUNTER — Telehealth: Payer: Self-pay | Admitting: Internal Medicine

## 2017-04-06 ENCOUNTER — Other Ambulatory Visit (HOSPITAL_BASED_OUTPATIENT_CLINIC_OR_DEPARTMENT_OTHER): Payer: Medicare Other

## 2017-04-06 ENCOUNTER — Ambulatory Visit (HOSPITAL_BASED_OUTPATIENT_CLINIC_OR_DEPARTMENT_OTHER): Payer: Medicare Other | Admitting: Internal Medicine

## 2017-04-06 ENCOUNTER — Encounter: Payer: Self-pay | Admitting: Internal Medicine

## 2017-04-06 ENCOUNTER — Telehealth: Payer: Self-pay | Admitting: Pharmacist

## 2017-04-06 ENCOUNTER — Other Ambulatory Visit: Payer: Self-pay | Admitting: Radiation Therapy

## 2017-04-06 VITALS — BP 137/62 | HR 72 | Temp 98.5°F | Resp 18 | Ht 67.0 in | Wt 169.2 lb

## 2017-04-06 DIAGNOSIS — C787 Secondary malignant neoplasm of liver and intrahepatic bile duct: Secondary | ICD-10-CM

## 2017-04-06 DIAGNOSIS — C3412 Malignant neoplasm of upper lobe, left bronchus or lung: Secondary | ICD-10-CM

## 2017-04-06 DIAGNOSIS — C3492 Malignant neoplasm of unspecified part of left bronchus or lung: Secondary | ICD-10-CM | POA: Diagnosis not present

## 2017-04-06 DIAGNOSIS — C7931 Secondary malignant neoplasm of brain: Secondary | ICD-10-CM

## 2017-04-06 DIAGNOSIS — C771 Secondary and unspecified malignant neoplasm of intrathoracic lymph nodes: Secondary | ICD-10-CM | POA: Diagnosis not present

## 2017-04-06 DIAGNOSIS — Z5111 Encounter for antineoplastic chemotherapy: Secondary | ICD-10-CM

## 2017-04-06 DIAGNOSIS — Z7189 Other specified counseling: Secondary | ICD-10-CM

## 2017-04-06 HISTORY — DX: Secondary malignant neoplasm of brain: C79.31

## 2017-04-06 HISTORY — DX: Encounter for antineoplastic chemotherapy: Z51.11

## 2017-04-06 LAB — CBC WITH DIFFERENTIAL/PLATELET
BASO%: 1.1 % (ref 0.0–2.0)
Basophils Absolute: 0.1 10*3/uL (ref 0.0–0.1)
EOS ABS: 0.3 10*3/uL (ref 0.0–0.5)
EOS%: 3.9 % (ref 0.0–7.0)
HCT: 42.6 % (ref 34.8–46.6)
HGB: 14.2 g/dL (ref 11.6–15.9)
LYMPH%: 33.6 % (ref 14.0–49.7)
MCH: 29.8 pg (ref 25.1–34.0)
MCHC: 33.3 g/dL (ref 31.5–36.0)
MCV: 89.7 fL (ref 79.5–101.0)
MONO#: 0.8 10*3/uL (ref 0.1–0.9)
MONO%: 9.1 % (ref 0.0–14.0)
NEUT%: 52.3 % (ref 38.4–76.8)
NEUTROS ABS: 4.3 10*3/uL (ref 1.5–6.5)
Platelets: 248 10*3/uL (ref 145–400)
RBC: 4.75 10*6/uL (ref 3.70–5.45)
RDW: 14.7 % — ABNORMAL HIGH (ref 11.2–14.5)
WBC: 8.3 10*3/uL (ref 3.9–10.3)
lymph#: 2.8 10*3/uL (ref 0.9–3.3)

## 2017-04-06 LAB — COMPREHENSIVE METABOLIC PANEL WITH GFR
ALT: 33 U/L (ref 0–55)
AST: 34 U/L (ref 5–34)
Albumin: 3.6 g/dL (ref 3.5–5.0)
Alkaline Phosphatase: 256 U/L — ABNORMAL HIGH (ref 40–150)
Anion Gap: 7 meq/L (ref 3–11)
BUN: 19.2 mg/dL (ref 7.0–26.0)
CO2: 27 meq/L (ref 22–29)
Calcium: 9.8 mg/dL (ref 8.4–10.4)
Chloride: 104 meq/L (ref 98–109)
Creatinine: 1 mg/dL (ref 0.6–1.1)
EGFR: 62 ml/min/1.73 m2 — ABNORMAL LOW
Glucose: 87 mg/dL (ref 70–140)
Potassium: 4.6 meq/L (ref 3.5–5.1)
Sodium: 138 meq/L (ref 136–145)
Total Bilirubin: 0.45 mg/dL (ref 0.20–1.20)
Total Protein: 8 g/dL (ref 6.4–8.3)

## 2017-04-06 MED ORDER — OSIMERTINIB MESYLATE 80 MG PO TABS: 80.0000 mg | ORAL_TABLET | Freq: Every day | ORAL | 2 refills | Status: DC

## 2017-04-06 NOTE — Progress Notes (Signed)
[place] [phone]   [phone]  OFFICE PROGRESS NOTE  Debbie Late, MD [place] [id number]  DIAGNOSIS: Stage IV (T1a, N2, M1 B) non-small cell lung cancer, adenocarcinoma with positive EGFR mutation with deletion in exon 19 diagnosed initially in January 2017, with metastatic disease in May 2018 based on liver biopsy.  PRIOR THERAPY: Stereotactic body radiotherapy to the left upper lobe lung nodule under the care of Dr. Pablo Ledger without biopsy completed 12/14/2015.  CURRENT THERAPY: Tagrisso 80 mg by mouth daily, first dose is expected in the next few days.  INTERVAL HISTORY: [name] 83 y.o. female returns to the clinic today for follow-up visit accompanied by her son. The patient is feeling fine today with no specific complaints. She denied having any chest pain, shortness of breath, cough or hemoptysis. She has no fever or chills. She has no nausea, vomiting, diarrhea or constipation. The patient denied having any weight loss or night sweats. She had several studies performed recently including PET scan as well as MRI of the brain. She also had molecular studies done by [facility] one and it showed positive EGFR mutation with deletion in exon 19. She is here for evaluation and discussion of her treatment options based on the recent studies.  MEDICAL HISTORY: Past Medical History:  Diagnosis Date  . Adenocarcinoma of left lung, stage 4 ([place]) 03/18/2017  . Arthritis    Neck,shoulder  . Colonic polyp   . Difficulty sleeping   . History of radiation therapy 12/04/15-12/14/15   left upper lobe 60 Gy  . Hx of unilateral nephrectomy 1994   right   . Hyperlipemia   . Lung cancer ([place])   . Numbness in feet   . Peripheral neuropathy   . Personal history of PE (pulmonary embolism) 2011    ALLERGIES:  is allergic to penicillins.  MEDICATIONS:  Current Outpatient Prescriptions  Medication Sig Dispense Refill    . CALCIUM PO Take 1 tablet by mouth 2 (two) times daily.    . celecoxib (CELEBREX) 200 MG capsule take 1 capsule by mouth once daily if needed for NECK PAIN OR ARTHRITIS  0  . gabapentin (NEURONTIN) 100 MG capsule 3 tablets at bedtime    . metoprolol succinate (TOPROL-XL) 25 MG 24 hr tablet Take 0.5 tablets (12.5 mg total) by mouth daily. 45 tablet 3  . Multiple Vitamin (MULTIVITAMIN WITH MINERALS) TABS Take 1 tablet by mouth daily.    . rivaroxaban (XARELTO) 20 MG TABS tablet Take 20 mg by mouth daily with supper.    . rosuvastatin (CRESTOR) 5 MG tablet Take 1 tablet by mouth daily.  0   No current facility-administered medications for this visit.     SURGICAL HISTORY:  Past Surgical History:  Procedure Laterality Date  . ABDOMINAL HYSTERECTOMY    . APPENDECTOMY    . cataracts Bilateral 09/2014   removal  . CESAREAN SECTION    . NEPHRECTOMY  1993   RT -   . TOTAL HIP ARTHROPLASTY Right 02/25/2013   Procedure: RIGHT TOTAL HIP ARTHROPLASTY ANTERIOR APPROACH;  Surgeon: Mcarthur Rossetti, MD;  Location: [facility];  Service: Orthopedics;  Laterality: Right;    REVIEW OF SYSTEMS:  Constitutional: negative Eyes: negative Ears, nose, mouth, throat, and face: negative Respiratory: negative Cardiovascular: negative Gastrointestinal: negative Genitourinary:negative Integument/breast: negative Hematologic/lymphatic: negative Musculoskeletal:negative Neurological: negative Behavioral/Psych: negative Endocrine: negative Allergic/Immunologic: negative   PHYSICAL EXAMINATION: General appearance: alert, cooperative and no distress  Head: Normocephalic, without obvious abnormality, atraumatic Neck: no adenopathy, no JVD, supple, symmetrical, trachea midline and thyroid not enlarged, symmetric, no tenderness/mass/nodules Lymph nodes: Cervical, supraclavicular, and axillary nodes normal. Resp: clear to auscultation bilaterally Back: symmetric, no curvature. ROM normal. No CVA  tenderness. Cardio: regular rate and rhythm, S1, S2 normal, no murmur, click, rub or gallop GI: soft, non-tender; bowel sounds normal; no masses,  no organomegaly Extremities: extremities normal, atraumatic, no cyanosis or edema Neurologic: Alert and oriented X 3, normal strength and tone. Normal symmetric reflexes. Normal coordination and gait  ECOG PERFORMANCE STATUS: 0 - Asymptomatic  Blood pressure 137/62, pulse 72, temperature 98.5 F (36.9 C), temperature source Oral, resp. rate 18, height '5\' 7"'  (1.702 m), weight 169 lb 3.2 oz (76.7 kg), SpO2 100 %.  LABORATORY DATA: Lab Results  Component Value Date   WBC 8.3 04/06/2017   HGB 14.2 04/06/2017   HCT 42.6 04/06/2017   MCV 89.7 04/06/2017   PLT 248 04/06/2017      Chemistry      Component Value Date/Time   NA 138 04/06/2017 1311   K 4.6 04/06/2017 1311   CL 105 02/26/2013 0553   CO2 27 04/06/2017 1311   BUN 19.2 04/06/2017 1311   CREATININE 1.0 04/06/2017 1311      Component Value Date/Time   CALCIUM 9.8 04/06/2017 1311   ALKPHOS 256 (H) 04/06/2017 1311   AST 34 04/06/2017 1311   ALT 33 04/06/2017 1311   BILITOT 0.45 04/06/2017 1311       RADIOGRAPHIC STUDIES: Mr Jeri Cos GU Contrast  Result Date: 04/02/2017 CLINICAL DATA:  83 year old female with stage IV lung cancer. Adenocarcinoma of the left lung. Restaging. EXAM: MRI HEAD WITHOUT AND WITH CONTRAST TECHNIQUE: Multiplanar, multiecho pulse sequences of the brain and surrounding structures were obtained without and with intravenous contrast. CONTRAST:  72m MULTIHANCE GADOBENATE DIMEGLUMINE 529 MG/ML IV SOLN COMPARISON:  Brain MRI 02/18/2016 FINDINGS: Brain: Stable cerebral volume.  No midline shift or mass effect. However, there is a new 2-3 mm focus of abnormal cortical enhancement in the left superior frontal gyrus on series 11, image 44. This is confirmed in the other 2 post-contrast imaging planes. No associated edema. No mass effect. No other metastatic disease  identified.  No dural thickening. No restricted diffusion to suggest acute infarction. No ventriculomegaly, extra-axial collection or acute intracranial hemorrhage. Cervicomedullary junction and pituitary are within normal limits. Noncontrast gray and white matter signal remains normal for age throughout the brain. No chronic cerebral blood products. Vascular: Major intracranial vascular flow voids are stable and within normal limits. Skull and upper cervical spine: Negative visualized cervical spine and spinal cord. Visible bone marrow signal remains normal. Sinuses/Orbits: Stable an negative. Other: Visible internal auditory structures appear normal. Negative scalp soft tissues. IMPRESSION: 1. Positive for early metastatic disease to the brain: Solitary 2-3 mm left superior frontal gyrus brain metastasis with no edema or mass effect. 2. No other acute intracranial abnormality or metastatic disease identified. Electronically Signed   By: HGenevie AnnM.D.   On: 04/02/2017 13:30   Nm Pet Image Restag (ps) Skull Base To Thigh  Result Date: 04/02/2017 CLINICAL DATA:  Initial treatment strategy for new diagnosis of stage IV left lung adenocarcinoma on biopsy proven liver metastasis detected on surveillance chest CT. History of radiation therapy to a presumed (not biopsied) hypermetabolic apical left upper lobe primary bronchogenic carcinoma completed 12/14/2015. EXAM: NUCLEAR MEDICINE PET SKULL BASE TO THIGH TECHNIQUE: 8.5 mCi F-18 FDG was injected  intravenously. Full-ring PET imaging was performed from the skull base to thigh after the radiotracer. CT data was obtained and used for attenuation correction and anatomic localization. FASTING BLOOD GLUCOSE:  Value: 108 mg/dl COMPARISON:  01/28/2017 chest CT. 02/14/2017 MRI abdomen. 10/30/2015 PET-CT. FINDINGS: NECK No hypermetabolic lymph nodes in the neck. CHEST Solid apical left upper lobe 1.4 x 1.1 cm pulmonary nodule (series 8/image 7) is mildly hypermetabolic with max  SUV 3.9, stable in size since 01/28/2017 with previous max SUV 10.8 on 10/30/2015 PET-CT. Patchy reticulation and ground-glass attenuation surrounding the apical left upper lobe pulmonary nodule is stable and compatible with postradiation change. Enlarged newly hypermetabolic 1.2 cm AP window node (series 4/image 62) with max SUV 17.1, stable in size since 01/28/2017 chest CT. Two newly hypermetabolic nonenlarged high left prevascular mediastinal nodes, for example a 0.6 cm node anterior to the left brachiocephalic vein with max SUV 5.6 (series 4/image 58), stable in size since 01/28/2017 chest CT. Atherosclerotic nonaneurysmal thoracic aorta. Coronary atherosclerosis. No pneumothorax. No pleural effusions. At least 3 scattered ground-glass pulmonary nodules in the right lung, largest 1.0 x 0.6 cm in the right upper lobe (series 8/image 28), all non hypermetabolic, not appreciably changed since 10/10/2015 chest CT. No acute consolidative airspace disease or new significant pulmonary nodules. ABDOMEN/PELVIS Three hypermetabolic hypodense liver masses as follows: - posterior right liver lobe 5.0 x 4.2 cm mass (series 4/ image 101) with max SUV 22.5 - anterior liver dome 1.6 x 1.1 cm mass (series 4/image 97) with max SUV 7.5 - posterior inferior right liver lobe mass (obscured on the CT images by streak artifact from the right nephrectomy clips) with max SUV 10.0 No abnormal hypermetabolic activity within the pancreas, adrenal glands, or spleen. No hypermetabolic lymph nodes in the abdomen or pelvis. Small hiatal hernia. Status post right nephrectomy. Compensatory hypertrophy of the left kidney. Moderate diffuse colonic diverticulosis. Atherosclerotic nonaneurysmal abdominal aorta. SKELETON No focal hypermetabolic activity to suggest skeletal metastasis. Right total hip arthroplasty. IMPRESSION: 1. Three hypermetabolic liver metastases, two of which are new since 02/14/2017 MRI abdomen study . 2. Hypermetabolic AP  window and high left prevascular mediastinal nodal metastases . 3. Mild hypermetabolism at the site of the treated apical left upper lobe pulmonary nodule, suspicious for local tumor recurrence. 4. Small ground-glass pulmonary nodules in the right lung are stable and non hypermetabolic . 5. No hypermetabolic skeletal metastases . 6. Numerous chronic findings as above. Electronically Signed   By: Ilona Sorrel M.D.   On: 04/02/2017 13:56    ASSESSMENT AND PLAN: This is a very pleasant 83 years old African-American female with metastatic non-small cell lung cancer that was initially diagnosed as a stage IA status post stereotactic radiotherapy of the left upper lobe and now she presented with disease recurrence as well as mediastinal lymphadenopathy and liver metastasis and solitary brain metastasis. 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 I personally and independently reviewed the imaging studies and discuss the results with the patient and her son. I discussed with the patient her treatment options and she understand that she has incurable condition and on the treatment will be of palliative nature. I gave her the option of palliative care versus consideration of treatment with targeted therapy with Tagrisso 80 mg by mouth daily. I discussed with the patient adverse effect of this treatment including but not limited to skin rash, diarrhea, interstitial lung disease, liver, renal dysfunction. She would like to proceed with the oral target  therapy. I will send her prescription to [facility] outpatient pharmacy. She was also seen by the pharmacist for the oral drug for discussion of the medication and chemotherapy education. I will also order an EKG to be performed today for close monitoring of her cardiac function. For the solitary brain metastasis, I referred the patient to Dr. Sondra Come for evaluation and consideration of stereotactic radiotherapy. I would see the  patient back for follow-up visit in 3 weeks for reevaluation and repeat blood work for close monitoring of her treatment. The patient was advised to call immediately if she has any concerning symptoms in the interval. The patient voices understanding of current disease status and treatment options and is in agreement with the current care plan. All questions were answered. The patient knows to call the clinic with any problems, questions or concerns. We can certainly see the patient much sooner if necessary. I spent 15 minutes counseling the patient face to face. The total time spent in the appointment was 25 minutes.  Disclaimer: This note was dictated with voice recognition software. Similar sounding words can inadvertently be transcribed and may not be corrected upon review.

## 2017-04-06 NOTE — Progress Notes (Signed)
Location/Histology of Brain Tumor: solitary brain metastases ( MRI IMPRESSION:04/02/17:)  1. Positive for early metastatic disease to the brain: Solitary 2-3 mm left superior frontal gyrus brain metastasis with no edema or mass effect.) 2. No other acute intracranial abnormality or metastatic disease identified.  Patient presented with symptoms of:  Disease recurrence,  Non small cell metastatic lung   to liver and brain   Past or anticipated interventions, if any, per neurosurgery: NO  Past or anticipated interventions, if any, per medical oncology:Tagrisso 80mg  oral daily , to start today 04/09/17; Dr. Rosalene Billings seen 04/06/17, follow up in 3 weeks,   Dose of Decadron, if applicable:  No  Recent neurologic symptoms, if any:   Seizures:  NO  Headaches: NO  Nausea:  NO  Dizziness/ataxia:  NO  Difficulty with hand coordination:   Focal numbness/weakness:   Visual deficits/changes: No  Confusion/Memory deficits: NO  Painful bone metastases at present, if any:  SAFETY ISSUES:  Prior radiation? YES, SBRT Left upper lobe lung 12/04/15-12/14/15 60 GY/5 fractions under Dr. Merrily Pew  Pacemaker/ICD? NO  Is the patient on methotrexate? No  Additional Complaints / other details: Widow, 1 living son,sister with colon cancer,another siter lung cancer,  Patient with right nephrectomy 1993, PE, 2011, Diagnosis 03/06/2017: Liver, needle/core biopsy, right - ADENOCARCINOMA BP (!) 149/82   Pulse (!) 101   Temp 98.2 F (36.8 C) (Oral)   Resp 18   Ht 5\' 7"  (1.702 m)   Wt 169 lb (76.7 kg)   SpO2 99%   BMI 26.47 kg/m   Wt Readings from Last 3 Encounters:  04/09/17 169 lb (76.7 kg)  04/06/17 169 lb 3.2 oz (76.7 kg)  03/18/17 169 lb 4.8 oz (76.8 kg)   Allergies:PCNS=rash

## 2017-04-06 NOTE — Telephone Encounter (Addendum)
Oral Chemotherapy Pharmacist Encounter   I spoke with patient for overview of new oral chemotherapy medication: Tagrisso. Pt is doing well. The prescriptions have been sent to the [facility] for benefit analysis and approval.   Counseled patient on administration, dosing, side effects, safe handling, and monitoring. Patient will take 80mg  by mouth once daily without regard to food.  Side effects include but not limited to: diarrhea, mouth sores, fatigue, rash, and electrolyte imbalanaces.  Mrs. Barnson voiced understanding and appreciation. She will make sure to get some over-the-counter anti-diarrheal to have at home.  MD to order EKG for baseline status.  All questions answered.  Will follow up with patient regarding insurance and pharmacy.  Labs from today (6/18) reviewed, OK for treatment Current medication list in Epic assessed, category C interaction with Crestor identified. Tagrisso may decrease clearance of Crestor. Patient counseled on Crestor side effects and will call the office with increase in muscle cramping.  Thank you,  Johny Drilling, PharmD, BCPS, BCOP 04/06/2017  4:44 PM Oral Oncology Clinic [phone]

## 2017-04-06 NOTE — Telephone Encounter (Signed)
Scheduled appt per 6/18 los - Gave patient AVS and calender per LOS.  

## 2017-04-07 ENCOUNTER — Telehealth: Payer: Self-pay | Admitting: *Deleted

## 2017-04-07 ENCOUNTER — Encounter: Payer: Self-pay | Admitting: *Deleted

## 2017-04-07 ENCOUNTER — Other Ambulatory Visit: Payer: Medicare Other

## 2017-04-07 NOTE — Telephone Encounter (Signed)
Oral Chemotherapy Pharmacist Encounter  Received notification from [facility] that prior authorization for Tagrisso is required. PA submitted on CoverMyMeds Key [id number] Status is pending.  Oral oncology Clinic will continue to follow.  Johny Drilling, PharmD, BCPS, BCOP 04/07/2017  8:18 AM Oral Oncology Clinic [phone]

## 2017-04-07 NOTE — Telephone Encounter (Signed)
This nurse will meet with patient at [facility] Thursday, 6/21 to give her additional info about services here.  Patient verbalized understanding.

## 2017-04-08 MED FILL — TAGRISSO 80 MG TABLET: 80 | 30 days supply | Qty: 30 | Fill #0

## 2017-04-09 ENCOUNTER — Ambulatory Visit
Admission: RE | Admit: 2017-04-09 | Discharge: 2017-04-09 | Disposition: A | Discharge status: Home / Self Care | Payer: Medicare Other | Source: Ambulatory Visit | Attending: Radiation Oncology | Admitting: Radiation Oncology

## 2017-04-09 ENCOUNTER — Encounter: Payer: Self-pay | Admitting: Radiation Oncology

## 2017-04-09 ENCOUNTER — Other Ambulatory Visit: Payer: Medicare Other

## 2017-04-09 ENCOUNTER — Encounter: Payer: Self-pay | Admitting: *Deleted

## 2017-04-09 ENCOUNTER — Ambulatory Visit: Payer: Medicare Other | Admitting: Internal Medicine

## 2017-04-09 VITALS — BP 149/82 | HR 101 | Temp 98.2°F | Resp 18 | Ht 67.0 in | Wt 169.0 lb

## 2017-04-09 DIAGNOSIS — C3492 Malignant neoplasm of unspecified part of left bronchus or lung: Secondary | ICD-10-CM

## 2017-04-09 DIAGNOSIS — C7931 Secondary malignant neoplasm of brain: Secondary | ICD-10-CM | POA: Diagnosis not present

## 2017-04-09 DIAGNOSIS — C7949 Secondary malignant neoplasm of other parts of nervous system: Secondary | ICD-10-CM

## 2017-04-09 NOTE — Progress Notes (Signed)
Please see the Nurse Progress Note in the MD Initial Consult Encounter for this patient. 

## 2017-04-09 NOTE — Telephone Encounter (Signed)
Oral Chemotherapy Pharmacist Encounter  Prior authorization approval for Tagrisso from [employer] Effective dates: 04/08/17-10/19/17 Ref# [id number]  Copayment $100, patient aware, no open foundation  Grants for copayment assistance at the moment.  Medication ready for pick-up at the [facility], patient to pick-up today (6/21).  Oral Oncology Clinic will continue to follow.  Johny Drilling, PharmD, BCPS, BCOP 04/09/2017  1:11 PM Oral Oncology Clinic [phone]

## 2017-04-09 NOTE — Progress Notes (Signed)
Radiation Oncology         [phone] ________________________________  Name: [name] MRN: [id number]  Date: 04/09/2017  DOB: 05/03/1928  HT:DSKAJGOT, Collier Salina, MD  Curt Bears, MD     REFERRING PHYSICIAN: Curt Bears, MD   DIAGNOSIS: The primary encounter diagnosis was Brain metastases [facility]). A diagnosis of Adenocarcinoma of left lung, stage 4 (HCC) was also pertinent to this visit.   HISTORY OF PRESENT ILLNESS: [name] is a 83 y.o. female seen at the request of Dr. Julien Nordmann for a history of recurrent metastatic Stage IA NSCLC, adenocarcinoma of the left upper lobe who presented for restaging scans on 01/28/17 which revealed a new lesion in the right liver measuring 3.8 cm, and stable nodes in the mediastinum and stable previously treated left apical pulmonary nodule and stable right upper lobe ground glass nodules. She underwent an MRI of the abdomen on 02/14/17 which revealed a 4 x 3.5 cm mass in the posterior right hepatic lobe with T2 hyperintensity and peripheral contrast enhancement. A biopsy on 03/03/17 revealed adenocarcinoma consistent with recurrent lung disease, and her tumor was tested and positive for EGFR mutation with deletion in exon 19. She is currently planning to begin Tagrisso, and comes today to discuss radiotherapy. Her PET revealed a 1.2 cm AP window node and several left prevascular nodes with SUVs of 5.6, and a 5 x 4.2 cm mass in the right liver with SUV of 7.5, and anterior liver lome lesion measuring 1.6 x 1.1 cm with an SUV of 7.5, and posterior right liver lobe mass obscured to to artifact but with a max SUV of 10. She had a brain MRI on 04/02/17 which revealed a 2-3 mm focus of abnormal cortical enhancement in the left superior frontal gyrus. No edema or mass effect is seen.  She has an [place] treatment planning MRI scheduled for 04/14/17.   PREVIOUS RADIATION THERAPY: Yes 16 months ago  12/04/15-2/24/17SBRT Treatment:  60 Gy in 5 fractions to a  lesion in the left upper lobe  PAST MEDICAL HISTORY:  Past Medical History:  Diagnosis Date  . Adenocarcinoma of left lung, stage 4 ([place]) 03/18/2017  . Arthritis    Neck,shoulder  . Brain metastases ([place]) 04/06/2017  . Colonic polyp   . Difficulty sleeping   . Encounter for antineoplastic chemotherapy 04/06/2017  . History of radiation therapy 12/04/15-12/14/15   left upper lobe 60 Gy  . Hx of unilateral nephrectomy 1994   right   . Hyperlipemia   . Lung cancer ([place])   . Numbness in feet   . Peripheral neuropathy   . Personal history of PE (pulmonary embolism) 2011       PAST SURGICAL HISTORY: Past Surgical History:  Procedure Laterality Date  . ABDOMINAL HYSTERECTOMY    . APPENDECTOMY    . cataracts Bilateral 09/2014   removal  . CESAREAN SECTION    . NEPHRECTOMY  1993   RT -   . TOTAL HIP ARTHROPLASTY Right 02/25/2013   Procedure: RIGHT TOTAL HIP ARTHROPLASTY ANTERIOR APPROACH;  Surgeon: Mcarthur Rossetti, MD;  Location: [facility];  Service: Orthopedics;  Laterality: Right;     FAMILY HISTORY:  Family History  Problem Relation Age of Onset  . Hypertension Father   . Diabetes Father   . Hypertension Mother   . Colon cancer Sister      SOCIAL HISTORY:  reports that she quit smoking about 27 years ago. Her smoking use included Cigarettes. She has a 5.00 pack-year  smoking history. She has never used smokeless tobacco. She reports that she does not drink alcohol or use drugs. The patient is widowed and resides in [place]. She's accompanied by her son.   ALLERGIES: Penicillins   MEDICATIONS:  Current Outpatient Prescriptions  Medication Sig Dispense Refill  . CALCIUM PO Take 1 tablet by mouth 2 (two) times daily.    Marland Kitchen gabapentin (NEURONTIN) 100 MG capsule 3 tablets at bedtime    . metoprolol succinate (TOPROL-XL) 25 MG 24 hr tablet Take 0.5 tablets (12.5 mg total) by mouth daily. 45 tablet 3  . Multiple Vitamin (MULTIVITAMIN WITH MINERALS) TABS Take 1 tablet by  mouth daily.    . rivaroxaban (XARELTO) 20 MG TABS tablet Take 20 mg by mouth daily with supper.    . rosuvastatin (CRESTOR) 5 MG tablet Take 1 tablet by mouth daily.  0  . celecoxib (CELEBREX) 200 MG capsule take 1 capsule by mouth once daily if needed for NECK PAIN OR ARTHRITIS  0  . osimertinib mesylate (TAGRISSO) 80 MG tablet Take 1 tablet (80 mg total) by mouth daily. (Patient not taking: Reported on 04/09/2017) 30 tablet 2   No current facility-administered medications for this encounter.      REVIEW OF SYSTEMS: On review of systems, the patient reports that she is doing well overall. She denies any chest pain, shortness of breath, cough, fevers, chills, night sweats, unintended weight changes.She denies any bowel or bladder disturbances, and denies abdominal pain, nausea or vomiting.She denies any new musculoskeletal or joint aches or pains. A complete review of systems is obtained and is otherwise negative.     PHYSICAL EXAM:  Wt Readings from Last 3 Encounters:  04/09/17 169 lb (76.7 kg)  04/06/17 169 lb 3.2 oz (76.7 kg)  03/18/17 169 lb 4.8 oz (76.8 kg)   Temp Readings from Last 3 Encounters:  04/09/17 98.2 F (36.8 C) (Oral)  04/06/17 98.5 F (36.9 C) (Oral)  03/18/17 98.2 F (36.8 C) (Oral)   BP Readings from Last 3 Encounters:  04/09/17 (!) 149/82  04/06/17 137/62  03/18/17 125/74   Pulse Readings from Last 3 Encounters:  04/09/17 (!) 101  04/06/17 72  03/18/17 92   Pain Assessment Pain Score: 0-No pain/10  In general this is a well appearing African American female in no acute distress. She's alert and oriented x4 and appropriate throughout the examination. Cardiopulmonary assessment is negative for acute distress and she exhibits normal effort.   ECOG = 0  0 - Asymptomatic (Fully active, able to carry on all predisease activities without restriction)  1 - Symptomatic but completely ambulatory (Restricted in physically strenuous activity but ambulatory and  able to carry out work of a light or sedentary nature. For example, light housework, office work)  2 - Symptomatic, <50% in bed during the day (Ambulatory and capable of all self care but unable to carry out any work activities. Up and about more than 50% of waking hours)  3 - Symptomatic, >50% in bed, but not bedbound (Capable of only limited self-care, confined to bed or chair 50% or more of waking hours)  4 - Bedbound (Completely disabled. Cannot carry on any self-care. Totally confined to bed or chair)  5 - Death   Eustace Pen MM, Creech RH, Tormey DC, et al. [phone]). "Toxicity and response criteria of the [facility]". [place] Oncol. 5 (6): 649-55    LABORATORY DATA:  Lab Results  Component Value Date   WBC 8.3 04/06/2017  HGB 14.2 04/06/2017   HCT 42.6 04/06/2017   MCV 89.7 04/06/2017   PLT 248 04/06/2017   Lab Results  Component Value Date   NA 138 04/06/2017   K 4.6 04/06/2017   CL 105 02/26/2013   CO2 27 04/06/2017   Lab Results  Component Value Date   ALT 33 04/06/2017   AST 34 04/06/2017   ALKPHOS 256 (H) 04/06/2017   BILITOT 0.45 04/06/2017      RADIOGRAPHY: Mr Jeri Cos MW Contrast  Result Date: 04/02/2017 CLINICAL DATA:  83 year old female with stage IV lung cancer. Adenocarcinoma of the left lung. Restaging. EXAM: MRI HEAD WITHOUT AND WITH CONTRAST TECHNIQUE: Multiplanar, multiecho pulse sequences of the brain and surrounding structures were obtained without and with intravenous contrast. CONTRAST:  26m MULTIHANCE GADOBENATE DIMEGLUMINE 529 MG/ML IV SOLN COMPARISON:  Brain MRI 02/18/2016 FINDINGS: Brain: Stable cerebral volume.  No midline shift or mass effect. However, there is a new 2-3 mm focus of abnormal cortical enhancement in the left superior frontal gyrus on series 11, image 44. This is confirmed in the other 2 post-contrast imaging planes. No associated edema. No mass effect. No other metastatic disease identified.  No dural  thickening. No restricted diffusion to suggest acute infarction. No ventriculomegaly, extra-axial collection or acute intracranial hemorrhage. Cervicomedullary junction and pituitary are within normal limits. Noncontrast gray and white matter signal remains normal for age throughout the brain. No chronic cerebral blood products. Vascular: Major intracranial vascular flow voids are stable and within normal limits. Skull and upper cervical spine: Negative visualized cervical spine and spinal cord. Visible bone marrow signal remains normal. Sinuses/Orbits: Stable an negative. Other: Visible internal auditory structures appear normal. Negative scalp soft tissues. IMPRESSION: 1. Positive for early metastatic disease to the brain: Solitary 2-3 mm left superior frontal gyrus brain metastasis with no edema or mass effect. 2. No other acute intracranial abnormality or metastatic disease identified. Electronically Signed   By: HGenevie AnnM.D.   On: 04/02/2017 13:30   Nm Pet Image Restag (ps) Skull Base To Thigh  Result Date: 04/02/2017 CLINICAL DATA:  Initial treatment strategy for new diagnosis of stage IV left lung adenocarcinoma on biopsy proven liver metastasis detected on surveillance chest CT. History of radiation therapy to a presumed (not biopsied) hypermetabolic apical left upper lobe primary bronchogenic carcinoma completed 12/14/2015. EXAM: NUCLEAR MEDICINE PET SKULL BASE TO THIGH TECHNIQUE: 8.5 mCi F-18 FDG was injected intravenously. Full-ring PET imaging was performed from the skull base to thigh after the radiotracer. CT data was obtained and used for attenuation correction and anatomic localization. FASTING BLOOD GLUCOSE:  Value: 108 mg/dl COMPARISON:  01/28/2017 chest CT. 02/14/2017 MRI abdomen. 10/30/2015 PET-CT. FINDINGS: NECK No hypermetabolic lymph nodes in the neck. CHEST Solid apical left upper lobe 1.4 x 1.1 cm pulmonary nodule (series 8/image 7) is mildly hypermetabolic with max SUV 3.9, stable in  size since 01/28/2017 with previous max SUV 10.8 on 10/30/2015 PET-CT. Patchy reticulation and ground-glass attenuation surrounding the apical left upper lobe pulmonary nodule is stable and compatible with postradiation change. Enlarged newly hypermetabolic 1.2 cm AP window node (series 4/image 62) with max SUV 17.1, stable in size since 01/28/2017 chest CT. Two newly hypermetabolic nonenlarged high left prevascular mediastinal nodes, for example a 0.6 cm node anterior to the left brachiocephalic vein with max SUV 5.6 (series 4/image 58), stable in size since 01/28/2017 chest CT. Atherosclerotic nonaneurysmal thoracic aorta. Coronary atherosclerosis. No pneumothorax. No pleural effusions. At least 3 scattered ground-glass pulmonary nodules  in the right lung, largest 1.0 x 0.6 cm in the right upper lobe (series 8/image 28), all non hypermetabolic, not appreciably changed since 10/10/2015 chest CT. No acute consolidative airspace disease or new significant pulmonary nodules. ABDOMEN/PELVIS Three hypermetabolic hypodense liver masses as follows: - posterior right liver lobe 5.0 x 4.2 cm mass (series 4/ image 101) with max SUV 22.5 - anterior liver dome 1.6 x 1.1 cm mass (series 4/image 97) with max SUV 7.5 - posterior inferior right liver lobe mass (obscured on the CT images by streak artifact from the right nephrectomy clips) with max SUV 10.0 No abnormal hypermetabolic activity within the pancreas, adrenal glands, or spleen. No hypermetabolic lymph nodes in the abdomen or pelvis. Small hiatal hernia. Status post right nephrectomy. Compensatory hypertrophy of the left kidney. Moderate diffuse colonic diverticulosis. Atherosclerotic nonaneurysmal abdominal aorta. SKELETON No focal hypermetabolic activity to suggest skeletal metastasis. Right total hip arthroplasty. IMPRESSION: 1. Three hypermetabolic liver metastases, two of which are new since 02/14/2017 MRI abdomen study . 2. Hypermetabolic AP window and high left  prevascular mediastinal nodal metastases . 3. Mild hypermetabolism at the site of the treated apical left upper lobe pulmonary nodule, suspicious for local tumor recurrence. 4. Small ground-glass pulmonary nodules in the right lung are stable and non hypermetabolic . 5. No hypermetabolic skeletal metastases . 6. Numerous chronic findings as above. Electronically Signed   By: Ilona Sorrel M.D.   On: 04/02/2017 13:56       IMPRESSION/PLAN: 1. Recurrent metastatic Stage IA NSCLC, adenocarcinoma of the left upper lobe with disease in the liver and brain. We discussed the options of radiotherapy and she appears to be a good candidate for radiotherapy with SRS protocol. She will proceed with a 3T MRI to confirm this, and if so, will be simulated on 04/15/17. We discussed the risks, benefits, short, and long term effects of radiotherapy, and the patient is interested in proceeding. Dr. Lisbeth Renshaw discusses the delivery and logistics of radiotherapy. Written consent is obtained and placed in the chart, a copy was provided to the patient. She will meet with Dr. Vertell Limber as well next week and move forward with treatment on 04/21/17.  In a visit lasting 60 minutes, greater than 50% of the time was spent face to face discussing options of treatment, the role of SRS, and coordinating the patient's care.  The above documentation reflects my direct findings during this shared patient visit.     Carola Rhine, PAC  This document serves as a record of services personally performed by Shona Simpson, PA-C and Kyung Rudd, MD. It was created on their behalf by Valeta Harms, a trained medical scribe. The creation of this record is based on the scribe's personal observations and the providers' statements to them. This document has been checked and approved by the attending provider.

## 2017-04-13 ENCOUNTER — Other Ambulatory Visit: Payer: Medicare Other

## 2017-04-14 ENCOUNTER — Ambulatory Visit
Admission: RE | Admit: 2017-04-14 | Discharge: 2017-04-14 | Disposition: A | Discharge status: Home / Self Care | Payer: Medicare Other | Source: Ambulatory Visit | Attending: Radiation Oncology | Admitting: Radiation Oncology

## 2017-04-14 DIAGNOSIS — C7931 Secondary malignant neoplasm of brain: Secondary | ICD-10-CM

## 2017-04-14 MED ORDER — GADOBENATE DIMEGLUMINE 529 MG/ML IV SOLN
16.0000 mL | Freq: Once | INTRAVENOUS | Status: AC | PRN
  Administered 2017-04-14: 16 mL via INTRAVENOUS

## 2017-04-15 ENCOUNTER — Ambulatory Visit
Admission: RE | Admit: 2017-04-15 | Discharge: 2017-04-15 | Disposition: A | Discharge status: Home / Self Care | Payer: Medicare Other | Source: Ambulatory Visit | Attending: Radiation Oncology | Admitting: Radiation Oncology

## 2017-04-15 ENCOUNTER — Encounter: Payer: Self-pay | Admitting: Radiation Therapy

## 2017-04-15 DIAGNOSIS — C7931 Secondary malignant neoplasm of brain: Secondary | ICD-10-CM | POA: Diagnosis not present

## 2017-04-15 NOTE — Progress Notes (Signed)
Has armband been applied?  Yes.    Does patient have an allergy to IV contrast dye?: No.   Has patient ever received premedication for IV contrast dye?: Yes.     Does patient take metformin?: No.  If patient does take metformin when was the last dose: NA  Date of lab work: 04/06/2017 BUN: 19.2 CR: 1.0  IV site: left posterior forearm  Has IV site been added to flowsheet?  Yes.

## 2017-04-15 NOTE — Progress Notes (Signed)
Does patient have ID band on?  Does patient have an allergy to IV contrast dye?:    Has patient ever received premedication for IV contrast dye?:    Does patient take metformin?: NO  If patient does take metformin when was the last dose: NO  Date of lab work:  6/18//18 BUN: 19.2 CR: 1.0  IV site:   Has IV site been added to flowsheet?   There were no vitals taken for this visit.

## 2017-04-15 NOTE — Progress Notes (Signed)
The patient was offered enrollment on our single institutional trial investigating open-faced vs closed-face head masks used to immobilize patients during stereotactic brain radiosurgery. The patient has elected to enroll on this trial.  In regards to the trial, the patient has voluntarily signed copies of the consent forms and all trial related questions were answered.   Mrs. Aramburo was # [phone] and randomized to have the closed face, brain Lab Mask. All paperwork was filled out and given to Claudean Severance for documentation.   Mont Dutton R.T.(R)(T) Special Procedures Navigator

## 2017-04-20 DIAGNOSIS — C7931 Secondary malignant neoplasm of brain: Secondary | ICD-10-CM | POA: Diagnosis not present

## 2017-04-21 ENCOUNTER — Encounter: Payer: Self-pay | Admitting: Radiation Oncology

## 2017-04-21 ENCOUNTER — Ambulatory Visit
Admission: RE | Admit: 2017-04-21 | Discharge: 2017-04-21 | Disposition: A | Discharge status: Home / Self Care | Payer: Medicare Other | Source: Ambulatory Visit | Attending: Radiation Oncology | Admitting: Radiation Oncology

## 2017-04-21 VITALS — BP 137/81 | HR 65 | Temp 98.0°F

## 2017-04-21 DIAGNOSIS — C7931 Secondary malignant neoplasm of brain: Secondary | ICD-10-CM

## 2017-04-21 NOTE — Op Note (Signed)
  Name: [name]  MRN: [id number]  Date: 04/21/2017   DOB: February 07, 1928  Stereotactic Radiosurgery Operative Note  PRE-OPERATIVE DIAGNOSIS:  Solitary Brain Metastasis  POST-OPERATIVE DIAGNOSIS:  Solitary Brain Metastasis  PROCEDURE:  Stereotactic Radiosurgery  SURGEON:  Peggyann Shoals, MD  NARRATIVE: The patient underwent a radiation treatment planning session in the radiation oncology simulation suite under the care of the radiation oncology physician and physicist.  I participated closely in the radiation treatment planning afterwards. The patient underwent planning CT which was fused to 3T high resolution MRI with 1 mm axial slices.  These images were fused on the planning system.  We contoured the gross target volumes and subsequently expanded this to yield the Planning Target Volume. I actively participated in the planning process.  I helped to define and review the target contours and also the contours of the optic pathway, eyes, brainstem and selected nearby organs at risk.  All the dose constraints for critical structures were reviewed and compared to AAPM Task Group 101.  The prescription dose conformity was reviewed.  I approved the plan electronically.    Accordingly, [name] was brought to the TrueBeam stereotactic radiation treatment linac and placed in the custom immobilization mask.  The patient was aligned according to the IR fiducial markers with BrainLab Exactrac, then orthogonal x-rays were used in ExacTrac with the 6DOF robotic table and the shifts were made to align the patient  [name] received stereotactic radiosurgery uneventfully.    The detailed description of the procedure is recorded in the radiation oncology procedure note.  I was present for the duration of the procedure.  DISPOSITION:  Following delivery, the patient was transported to nursing in stable condition and monitored for possible acute effects to be discharged to home in stable condition  with follow-up in one month.  Peggyann Shoals, MD 04/21/2017 4:15 PM

## 2017-04-29 NOTE — Progress Notes (Signed)
  Radiation Oncology         [phone] ________________________________  Name: TALICIA SUI MRN: [id number]  Date: 04/21/2017  DOB: 1928/09/19   SPECIAL TREATMENT PROCEDURE   3D TREATMENT PLANNING AND DOSIMETRY: The patient's radiation plan was reviewed and approved by Dr. Vertell Limber from neurosurgery and radiation oncology prior to treatment. It showed 3-dimensional radiation distributions overlaid onto the planning CT/MRI image set. The [facility] for the target structures as well as the organs at risk were reviewed. The documentation of the 3D plan and dosimetry are filed in the radiation oncology EMR.   NARRATIVE: The patient was brought to the TrueBeam stereotactic radiation treatment machine and placed supine on the CT couch. The head frame was applied, and the patient was set up for stereotactic radiosurgery. Neurosurgery was present for the set-up and delivery   SIMULATION VERIFICATION: In the couch zero-angle position, the patient underwent Exactrac imaging using the Brainlab system with orthogonal KV images. These were carefully aligned and repeated to confirm treatment position for each of the isocenters. The Exactrac snap film verification was repeated at each couch angle.   SPECIAL TREATMENT PROCEDURE: The patient received stereotactic radiosurgery to the following target:  PTV1 target was treated using 3 Arcs to a prescription dose of 20 Gy. ExacTrac Snap verification was performed for each couch angle.   STEREOTACTIC TREATMENT MANAGEMENT: Following delivery, the patient was transported to nursing in stable condition and monitored for possible acute effects. Vital signs were recorded . The patient tolerated treatment without significant acute effects, and was discharged to home in stable condition.  PLAN: Follow-up in one month.   ------------------------------------------------  Jodelle Gross, MD, PhD

## 2017-04-29 NOTE — Progress Notes (Signed)
  Radiation Oncology         [phone] ________________________________  Name: SHERLYNN TOURVILLE MRN: [id number]  Date: 04/15/2017  DOB: 1927/12/24  DIAGNOSIS:     ICD-10-CM   1. Brain metastases ([place]) C79.31     NARRATIVE:  The patient was brought to the [facility].  Identity was confirmed.  All relevant records and images related to the planned course of therapy were reviewed.  The patient freely provided informed written consent to proceed with treatment after reviewing the details related to the planned course of therapy. The consent form was witnessed and verified by the simulation staff. Intravenous access was established for contrast administration. Then, the patient was set-up in a stable reproducible supine position for radiation therapy.  A relocatable thermoplastic stereotactic head frame was fabricated for precise immobilization.  CT images were obtained.  Surface markings were placed.  The CT images were loaded into the planning software and fused with the patient's targeting MRI scan.  Then the target and avoidance structures were contoured.  Treatment planning then occurred.  The radiation prescription was entered and confirmed.  I have requested 3D planning  I have requested a DVH of the following structures: Brain stem, brain, left eye, right eye, lenses, optic chiasm, target volumes, uninvolved brain, and normal tissue.    SPECIAL TREATMENT PROCEDURE:  The planned course of therapy using radiation constitutes a special treatment procedure. Special care is required in the management of this patient for the following reasons. This treatment constitutes a Special Treatment Procedure for the following reason: High dose per fraction requiring special monitoring for increased toxicities of treatment including daily imaging.  The special nature of the planned course of radiotherapy will require increased physician supervision and oversight to ensure patient's safety with  optimal treatment outcomes.  PLAN:  The patient will receive 20 Gy in 1 fraction.   ------------------------------------------------  Jodelle Gross, MD, PhD

## 2017-04-29 NOTE — Progress Notes (Signed)
  Radiation Oncology         (336) [phone] ________________________________  Name: [name] MRN: [id number]  Date: 04/21/2017  DOB: 09/25/1928  End of Treatment Note  Diagnosis:   Recurrent metastatic Stage IA NSCLC, adenocarcinoma of the left upper lobe with disease in the liver and brain.     Indication for treatment:  Palliative       Radiation treatment dates:   04/21/2017  Site/dose:   The brain was treated to 20 Gy in 1 fraction.  Beams/energy:   6X FFF // SRS  Narrative: The patient tolerated SRS treatment relatively well.   The patient experienced no acute side effects with treatment.   Plan: The patient has completed radiation treatment. The patient will return to radiation oncology clinic for routine followup in one month. I advised them to call or return sooner if they have any questions or concerns related to their recovery or treatment.  ------------------------------------------------  Jodelle Gross, MD, PhD  This document serves as a record of services personally performed by Kyung Rudd, MD. It was created on his behalf by Arlyce Harman, a trained medical scribe. The creation of this record is based on the scribe's personal observations and the provider's statements to them. This document has been checked and approved by the attending provider.

## 2017-05-01 ENCOUNTER — Telehealth: Payer: Self-pay | Admitting: Internal Medicine

## 2017-05-01 ENCOUNTER — Ambulatory Visit (HOSPITAL_BASED_OUTPATIENT_CLINIC_OR_DEPARTMENT_OTHER): Payer: Medicare Other | Admitting: Internal Medicine

## 2017-05-01 ENCOUNTER — Encounter: Payer: Self-pay | Admitting: Internal Medicine

## 2017-05-01 ENCOUNTER — Other Ambulatory Visit (HOSPITAL_BASED_OUTPATIENT_CLINIC_OR_DEPARTMENT_OTHER): Payer: Medicare Other

## 2017-05-01 VITALS — BP 135/62 | HR 85 | Temp 98.0°F | Resp 20 | Ht 67.0 in | Wt 168.4 lb

## 2017-05-01 DIAGNOSIS — R160 Hepatomegaly, not elsewhere classified: Secondary | ICD-10-CM

## 2017-05-01 DIAGNOSIS — C3412 Malignant neoplasm of upper lobe, left bronchus or lung: Secondary | ICD-10-CM | POA: Diagnosis not present

## 2017-05-01 DIAGNOSIS — C787 Secondary malignant neoplasm of liver and intrahepatic bile duct: Secondary | ICD-10-CM

## 2017-05-01 DIAGNOSIS — C7931 Secondary malignant neoplasm of brain: Secondary | ICD-10-CM

## 2017-05-01 DIAGNOSIS — C3492 Malignant neoplasm of unspecified part of left bronchus or lung: Secondary | ICD-10-CM

## 2017-05-01 DIAGNOSIS — Z5111 Encounter for antineoplastic chemotherapy: Secondary | ICD-10-CM

## 2017-05-01 DIAGNOSIS — Z7189 Other specified counseling: Secondary | ICD-10-CM

## 2017-05-01 LAB — COMPREHENSIVE METABOLIC PANEL
ALBUMIN: 3.5 g/dL (ref 3.5–5.0)
ALK PHOS: 262 U/L — AB (ref 40–150)
ALT: 33 U/L (ref 0–55)
AST: 34 U/L (ref 5–34)
Anion Gap: 11 mEq/L (ref 3–11)
BILIRUBIN TOTAL: 0.53 mg/dL (ref 0.20–1.20)
BUN: 21.8 mg/dL (ref 7.0–26.0)
CALCIUM: 9.3 mg/dL (ref 8.4–10.4)
CO2: 23 mEq/L (ref 22–29)
CREATININE: 1.2 mg/dL — AB (ref 0.6–1.1)
Chloride: 107 mEq/L (ref 98–109)
EGFR: 48 mL/min/{1.73_m2} — ABNORMAL LOW (ref >90)
Glucose: 121 mg/dl (ref 70–140)
Potassium: 4.4 mEq/L (ref 3.5–5.1)
Sodium: 141 mEq/L (ref 136–145)
TOTAL PROTEIN: 7.6 g/dL (ref 6.4–8.3)

## 2017-05-01 LAB — CBC WITH DIFFERENTIAL/PLATELET
BASO%: 0.2 % (ref 0.0–2.0)
Basophils Absolute: 0 10*3/uL (ref 0.0–0.1)
EOS%: 5.3 % (ref 0.0–7.0)
Eosinophils Absolute: 0.3 10*3/uL (ref 0.0–0.5)
HCT: 39.1 % (ref 34.8–46.6)
HEMOGLOBIN: 13.2 g/dL (ref 11.6–15.9)
LYMPH%: 32.4 % (ref 14.0–49.7)
MCH: 30.2 pg (ref 25.1–34.0)
MCHC: 33.8 g/dL (ref 31.5–36.0)
MCV: 89.5 fL (ref 79.5–101.0)
MONO#: 0.5 10*3/uL (ref 0.1–0.9)
MONO%: 8.8 % (ref 0.0–14.0)
NEUT#: 2.7 10*3/uL (ref 1.5–6.5)
NEUT%: 53.3 % (ref 38.4–76.8)
PLATELETS: 145 10*3/uL (ref 145–400)
RBC: 4.37 10*6/uL (ref 3.70–5.45)
RDW: 15.1 % — AB (ref 11.2–14.5)
WBC: 5.1 10*3/uL (ref 3.9–10.3)
lymph#: 1.7 10*3/uL (ref 0.9–3.3)

## 2017-05-01 MED FILL — TAGRISSO 80 MG TABLET: 80 | 30 days supply | Qty: 30 | Fill #1

## 2017-05-01 NOTE — Progress Notes (Signed)
[facility] [phone]) [phone]   [phone]  OFFICE PROGRESS NOTE  Derinda Late, MD [place] [address]  DIAGNOSIS: Stage IV (T1a, N2, M1 B) non-small cell lung cancer, adenocarcinoma with positive EGFR mutation with deletion in exon 19 diagnosed initially in January 2017, with metastatic disease in May 2018 based on liver biopsy.  PRIOR THERAPY:  1) Stereotactic body radiotherapy to the left upper lobe lung nodule under the care of Dr. Pablo Ledger without biopsy completed 12/14/2015. 2) stereotactic radiotherapy to a solitary brain metastasis under the care of Dr. Lisbeth Renshaw on 04/21/2017.  CURRENT THERAPY: Tagrisso 80 mg by mouth daily, first dose started 04/09/2017.  INTERVAL HISTORY: [name] 83 y.o. female returns to the clinic today for follow-up visit accompanied by her son. The patient was recently started on treatment with Tagrisso 80 mg by mouth daily and has been tolerating this treatment fairly well with no significant adverse effects. She denied having any skin rash or diarrhea. She has no nausea, vomiting, diarrhea or constipation. She has no fever or chills. She denied having any weight loss or night sweats. She denied having any chest pain, shortness breath, cough or hemoptysis. She underwent stereotactic radiotherapy to the solitary brain metastases on 04/21/2017 and tolerated the procedure well. She is here today for evaluation and repeat blood work.  MEDICAL HISTORY: Past Medical History:  Diagnosis Date  . Adenocarcinoma of left lung, stage 4 ([facility]) 03/18/2017  . Arthritis    Neck,shoulder  . Brain metastases ([facility]) 04/06/2017  . Colonic polyp   . Difficulty sleeping   . Encounter for antineoplastic chemotherapy 04/06/2017  . History of radiation therapy 12/04/15-12/14/15   left upper lobe 60 Gy  . Hx of unilateral nephrectomy 1994   right   . Hyperlipemia   . Lung cancer ([facility])   . Numbness in feet   .  Peripheral neuropathy   . Personal history of PE (pulmonary embolism) 2011    ALLERGIES:  is allergic to penicillins.  MEDICATIONS:  Current Outpatient Prescriptions  Medication Sig Dispense Refill  . CALCIUM PO Take 1 tablet by mouth 2 (two) times daily.    . celecoxib (CELEBREX) 200 MG capsule take 1 capsule by mouth once daily if needed for NECK PAIN OR ARTHRITIS  0  . gabapentin (NEURONTIN) 100 MG capsule 3 tablets at bedtime    . metoprolol succinate (TOPROL-XL) 25 MG 24 hr tablet Take 0.5 tablets (12.5 mg total) by mouth daily. 45 tablet 3  . Multiple Vitamin (MULTIVITAMIN WITH MINERALS) TABS Take 1 tablet by mouth daily.    Marland Kitchen osimertinib mesylate (TAGRISSO) 80 MG tablet Take 1 tablet (80 mg total) by mouth daily. (Patient not taking: Reported on 04/09/2017) 30 tablet 2  . rivaroxaban (XARELTO) 20 MG TABS tablet Take 20 mg by mouth daily with supper.    . rosuvastatin (CRESTOR) 5 MG tablet Take 1 tablet by mouth daily.  0   No current facility-administered medications for this visit.     SURGICAL HISTORY:  Past Surgical History:  Procedure Laterality Date  . ABDOMINAL HYSTERECTOMY    . APPENDECTOMY    . cataracts Bilateral 09/2014   removal  . CESAREAN SECTION    . NEPHRECTOMY  1993   RT -   . TOTAL HIP ARTHROPLASTY Right 02/25/2013   Procedure: RIGHT TOTAL HIP ARTHROPLASTY ANTERIOR APPROACH;  Surgeon: Mcarthur Rossetti, MD;  Location: [facility];  Service: Orthopedics;  Laterality: Right;  REVIEW OF SYSTEMS:  A comprehensive review of systems was negative.   PHYSICAL EXAMINATION: General appearance: alert, cooperative and no distress Head: Normocephalic, without obvious abnormality, atraumatic Neck: no adenopathy, no JVD, supple, symmetrical, trachea midline and thyroid not enlarged, symmetric, no tenderness/mass/nodules Lymph nodes: Cervical, supraclavicular, and axillary nodes normal. Resp: clear to auscultation bilaterally Back: symmetric, no curvature. ROM normal.  No CVA tenderness. Cardio: regular rate and rhythm, S1, S2 normal, no murmur, click, rub or gallop GI: soft, non-tender; bowel sounds normal; no masses,  no organomegaly Extremities: extremities normal, atraumatic, no cyanosis or edema  ECOG PERFORMANCE STATUS: 0 - Asymptomatic  Blood pressure 135/62, pulse 85, temperature 98 F (36.7 C), temperature source Oral, resp. rate 20, height '5\' 7"'  (1.702 m), weight 168 lb 6.4 oz (76.4 kg), SpO2 100 %.  LABORATORY DATA: Lab Results  Component Value Date   WBC 5.1 05/01/2017   HGB 13.2 05/01/2017   HCT 39.1 05/01/2017   MCV 89.5 05/01/2017   PLT 145 05/01/2017      Chemistry      Component Value Date/Time   NA 138 04/06/2017 1311   K 4.6 04/06/2017 1311   CL 105 02/26/2013 0553   CO2 27 04/06/2017 1311   BUN 19.2 04/06/2017 1311   CREATININE 1.0 04/06/2017 1311      Component Value Date/Time   CALCIUM 9.8 04/06/2017 1311   ALKPHOS 256 (H) 04/06/2017 1311   AST 34 04/06/2017 1311   ALT 33 04/06/2017 1311   BILITOT 0.45 04/06/2017 1311       RADIOGRAPHIC STUDIES: Mr Jeri Cos YY Contrast  Result Date: 04/14/2017 CLINICAL DATA:  83 year old female with stage IV metastatic lung cancer, SRS protocol for targeting. EXAM: MRI HEAD WITHOUT AND WITH CONTRAST TECHNIQUE: Multiplanar, multiecho pulse sequences of the brain and surrounding structures were obtained without and with intravenous contrast. CONTRAST:  37m MULTIHANCE GADOBENATE DIMEGLUMINE 529 MG/ML IV SOLN COMPARISON:  Comparison made with recent MRI from 04/02/2017. FINDINGS: Brain: Stable cerebral volume. No significant cerebral white matter disease for age. No evidence for acute or subacute infarct. Gray-white matter differentiation maintained. No evidence for acute or chronic intracranial hemorrhage. Ventricles normal in size without evidence for hydrocephalus. No extra-axial fluid collection. Major dural sinuses are grossly patent. Incidental note made of an empty sella.  Previously identified subcentimeter enhancing metastatic lesion at the cortical gray matter of the left superior frontal gyrus again seen, slightly increased in size on today's study measuring 5 mm (series 10, image 122). Small amount of associated FLAIR signal abnormality without significant edema. No other metastatic lesions seen elsewhere within the brain. Vascular: Major intracranial vascular flow voids are maintained. Skull and upper cervical spine: Craniocervical junction within normal limits. Visualized upper cervical spine unremarkable. Bone marrow signal intensity within normal limits. No scalp soft tissue abnormality. Sinuses/Orbits: Globes and orbital soft tissues within normal limits. Patient status post lens extraction bilaterally. Paranasal sinuses are clear. No mastoid effusion. Inner ear structures normal. IMPRESSION: 1. Slight interval increase in size of solitary left superior frontal gyrus brain metastasis, now measuring 5 mm. No associated edema or mass effect. 2. No other acute intracranial abnormality or metastatic disease identified. Electronically Signed   By: BJeannine BogaM.D.   On: 04/14/2017 16:26   Mr BJeri CosWTKContrast  Result Date: 04/02/2017 CLINICAL DATA:  83year old female with stage IV lung cancer. Adenocarcinoma of the left lung. Restaging. EXAM: MRI HEAD WITHOUT AND WITH CONTRAST TECHNIQUE: Multiplanar, multiecho pulse sequences of the brain  and surrounding structures were obtained without and with intravenous contrast. CONTRAST:  58m MULTIHANCE GADOBENATE DIMEGLUMINE 529 MG/ML IV SOLN COMPARISON:  Brain MRI 02/18/2016 FINDINGS: Brain: Stable cerebral volume.  No midline shift or mass effect. However, there is a new 2-3 mm focus of abnormal cortical enhancement in the left superior frontal gyrus on series 11, image 44. This is confirmed in the other 2 post-contrast imaging planes. No associated edema. No mass effect. No other metastatic disease identified.  No dural  thickening. No restricted diffusion to suggest acute infarction. No ventriculomegaly, extra-axial collection or acute intracranial hemorrhage. Cervicomedullary junction and pituitary are within normal limits. Noncontrast gray and white matter signal remains normal for age throughout the brain. No chronic cerebral blood products. Vascular: Major intracranial vascular flow voids are stable and within normal limits. Skull and upper cervical spine: Negative visualized cervical spine and spinal cord. Visible bone marrow signal remains normal. Sinuses/Orbits: Stable an negative. Other: Visible internal auditory structures appear normal. Negative scalp soft tissues. IMPRESSION: 1. Positive for early metastatic disease to the brain: Solitary 2-3 mm left superior frontal gyrus brain metastasis with no edema or mass effect. 2. No other acute intracranial abnormality or metastatic disease identified. Electronically Signed   By: HGenevie AnnM.D.   On: 04/02/2017 13:30   Nm Pet Image Restag (ps) Skull Base To Thigh  Result Date: 04/02/2017 CLINICAL DATA:  Initial treatment strategy for new diagnosis of stage IV left lung adenocarcinoma on biopsy proven liver metastasis detected on surveillance chest CT. History of radiation therapy to a presumed (not biopsied) hypermetabolic apical left upper lobe primary bronchogenic carcinoma completed 12/14/2015. EXAM: NUCLEAR MEDICINE PET SKULL BASE TO THIGH TECHNIQUE: 8.5 mCi F-18 FDG was injected intravenously. Full-ring PET imaging was performed from the skull base to thigh after the radiotracer. CT data was obtained and used for attenuation correction and anatomic localization. FASTING BLOOD GLUCOSE:  Value: 108 mg/dl COMPARISON:  01/28/2017 chest CT. 02/14/2017 MRI abdomen. 10/30/2015 PET-CT. FINDINGS: NECK No hypermetabolic lymph nodes in the neck. CHEST Solid apical left upper lobe 1.4 x 1.1 cm pulmonary nodule (series 8/image 7) is mildly hypermetabolic with max SUV 3.9, stable in  size since 01/28/2017 with previous max SUV 10.8 on 10/30/2015 PET-CT. Patchy reticulation and ground-glass attenuation surrounding the apical left upper lobe pulmonary nodule is stable and compatible with postradiation change. Enlarged newly hypermetabolic 1.2 cm AP window node (series 4/image 62) with max SUV 17.1, stable in size since 01/28/2017 chest CT. Two newly hypermetabolic nonenlarged high left prevascular mediastinal nodes, for example a 0.6 cm node anterior to the left brachiocephalic vein with max SUV 5.6 (series 4/image 58), stable in size since 01/28/2017 chest CT. Atherosclerotic nonaneurysmal thoracic aorta. Coronary atherosclerosis. No pneumothorax. No pleural effusions. At least 3 scattered ground-glass pulmonary nodules in the right lung, largest 1.0 x 0.6 cm in the right upper lobe (series 8/image 28), all non hypermetabolic, not appreciably changed since 10/10/2015 chest CT. No acute consolidative airspace disease or new significant pulmonary nodules. ABDOMEN/PELVIS Three hypermetabolic hypodense liver masses as follows: - posterior right liver lobe 5.0 x 4.2 cm mass (series 4/ image 101) with max SUV 22.5 - anterior liver dome 1.6 x 1.1 cm mass (series 4/image 97) with max SUV 7.5 - posterior inferior right liver lobe mass (obscured on the CT images by streak artifact from the right nephrectomy clips) with max SUV 10.0 No abnormal hypermetabolic activity within the pancreas, adrenal glands, or spleen. No hypermetabolic lymph nodes in the abdomen  or pelvis. Small hiatal hernia. Status post right nephrectomy. Compensatory hypertrophy of the left kidney. Moderate diffuse colonic diverticulosis. Atherosclerotic nonaneurysmal abdominal aorta. SKELETON No focal hypermetabolic activity to suggest skeletal metastasis. Right total hip arthroplasty. IMPRESSION: 1. Three hypermetabolic liver metastases, two of which are new since 02/14/2017 MRI abdomen study . 2. Hypermetabolic AP window and high left  prevascular mediastinal nodal metastases . 3. Mild hypermetabolism at the site of the treated apical left upper lobe pulmonary nodule, suspicious for local tumor recurrence. 4. Small ground-glass pulmonary nodules in the right lung are stable and non hypermetabolic . 5. No hypermetabolic skeletal metastases . 6. Numerous chronic findings as above. Electronically Signed   By: Ilona Sorrel M.D.   On: 04/02/2017 13:56    ASSESSMENT AND PLAN:  This is a very pleasant 83 years old African-American female with metastatic non-small cell lung cancer that was initially diagnosed as a stage IA status post stereotactic radiotherapy of the left upper lobe and now she presented with disease recurrence as well as mediastinal lymphadenopathy and liver metastasis and solitary brain metastasis. 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 She underwent stereotactic radiotherapy to a solitary brain metastasis. The patient was started on treatment with Tagrisso 80 mg by mouth daily and has been tolerating it well for the last 3 weeks. I recommended for her to continue her current treatment with Tagrisso with the same dose. I will see the patient back for follow-up visit in 3 weeks for reevaluation and repeat blood work. She was advised to call immediately if she has any concerning symptoms in the interval. The patient voices understanding of current disease status and treatment options and is in agreement with the current care plan. All questions were answered. The patient knows to call the clinic with any problems, questions or concerns. We can certainly see the patient much sooner if necessary. I spent 10 minutes counseling the patient face to face. The total time spent in the appointment was 15 minutes.  Disclaimer: This note was dictated with voice recognition software. Similar sounding words can inadvertently be transcribed and may not be corrected upon review.

## 2017-05-01 NOTE — Telephone Encounter (Signed)
Scheduled appt per 7/13 los - Gave patient AVS and calender per los - lab and follow up around 8/2 - next available was 8/13

## 2017-05-20 NOTE — Progress Notes (Signed)
Recurrent metastatic Stage IA NSCLC, adenocarcinoma of the left upper lobe with disease in the liver and brain

## 2017-05-25 ENCOUNTER — Ambulatory Visit
Admission: RE | Admit: 2017-05-25 | Discharge: 2017-05-25 | Disposition: A | Discharge status: Home / Self Care | Payer: Medicare Other | Source: Ambulatory Visit | Attending: Radiation Oncology | Admitting: Radiation Oncology

## 2017-05-25 ENCOUNTER — Encounter: Payer: Self-pay | Admitting: Radiation Oncology

## 2017-05-25 VITALS — BP 127/73 | HR 93 | Temp 97.7°F | Resp 18 | Ht 67.0 in | Wt 169.6 lb

## 2017-05-25 DIAGNOSIS — Z833 Family history of diabetes mellitus: Secondary | ICD-10-CM | POA: Diagnosis not present

## 2017-05-25 DIAGNOSIS — Z9889 Other specified postprocedural states: Secondary | ICD-10-CM | POA: Diagnosis not present

## 2017-05-25 DIAGNOSIS — Z880 Allergy status to penicillin: Secondary | ICD-10-CM | POA: Insufficient documentation

## 2017-05-25 DIAGNOSIS — C3412 Malignant neoplasm of upper lobe, left bronchus or lung: Secondary | ICD-10-CM | POA: Insufficient documentation

## 2017-05-25 DIAGNOSIS — E785 Hyperlipidemia, unspecified: Secondary | ICD-10-CM | POA: Insufficient documentation

## 2017-05-25 DIAGNOSIS — Z905 Acquired absence of kidney: Secondary | ICD-10-CM | POA: Diagnosis not present

## 2017-05-25 DIAGNOSIS — Z7901 Long term (current) use of anticoagulants: Secondary | ICD-10-CM | POA: Insufficient documentation

## 2017-05-25 DIAGNOSIS — Z86711 Personal history of pulmonary embolism: Secondary | ICD-10-CM | POA: Diagnosis not present

## 2017-05-25 DIAGNOSIS — C787 Secondary malignant neoplasm of liver and intrahepatic bile duct: Secondary | ICD-10-CM | POA: Diagnosis present

## 2017-05-25 DIAGNOSIS — Z808 Family history of malignant neoplasm of other organs or systems: Secondary | ICD-10-CM | POA: Diagnosis not present

## 2017-05-25 DIAGNOSIS — Z79899 Other long term (current) drug therapy: Secondary | ICD-10-CM | POA: Diagnosis not present

## 2017-05-25 DIAGNOSIS — Z8249 Family history of ischemic heart disease and other diseases of the circulatory system: Secondary | ICD-10-CM | POA: Diagnosis not present

## 2017-05-25 DIAGNOSIS — Z87891 Personal history of nicotine dependence: Secondary | ICD-10-CM | POA: Insufficient documentation

## 2017-05-25 DIAGNOSIS — Z923 Personal history of irradiation: Secondary | ICD-10-CM | POA: Diagnosis not present

## 2017-05-25 DIAGNOSIS — Z96641 Presence of right artificial hip joint: Secondary | ICD-10-CM | POA: Insufficient documentation

## 2017-05-25 DIAGNOSIS — C7931 Secondary malignant neoplasm of brain: Secondary | ICD-10-CM | POA: Insufficient documentation

## 2017-05-25 DIAGNOSIS — C3492 Malignant neoplasm of unspecified part of left bronchus or lung: Secondary | ICD-10-CM

## 2017-05-25 DIAGNOSIS — Z90710 Acquired absence of both cervix and uterus: Secondary | ICD-10-CM | POA: Diagnosis not present

## 2017-05-25 DIAGNOSIS — M25561 Pain in right knee: Secondary | ICD-10-CM | POA: Insufficient documentation

## 2017-05-25 NOTE — Addendum Note (Signed)
Encounter addended by: Malena Edman, RN on: 05/25/2017 10:57 AM<BR>    Actions taken: Charge Capture section accepted

## 2017-05-25 NOTE — Progress Notes (Signed)
Radiation Oncology         [phone] ________________________________  Name: [name] MRN: [id number]  Date: 05/25/2017  DOB: September 04, 1928  Post Treatment Follow Up Note  CC: Derinda Late, MD  Curt Bears, MD  Diagnosis:   Recurrent metastatic Stage IA NSCLC, adenocarcinoma of the left upper lobe with disease in the liver and brain.     Interval Since Last Radiation: 5 weeks   04/21/17 SRS Treatment: 1. PTV1 left superior frontal gyrus received 20 Gy in 1 fraction  12/04/15-2/24/17SBRT Treatment:  60 Gy in 5 fractions to a lesion in the left upper lobe  Narrative:  The patient returns today for routine follow-up. The patient has a history of Stage IA NSCLC, of the left upper lobe treated with SBRT in February 2017. She was found to have recurrent disease in the liver in the spring of 2018a nd a staging scan revealed a 2-3 mm focus of enhancement concerning for disease in a left superior frontal gyrus. She completed SRS in July 2018, and comes today for follow up since treatment. She has been on Tagrisso systemically with Dr. Julien Nordmann and has been tolerating this well.                        On review of systems, the patient states she's doing great overall. She denies any headaches, visual, auditory, speech, or balance dysfunction. She complains of right knee pain and is curious about taking celebrex. No other complaints are noted.  ALLERGIES:  is allergic to penicillins.  Meds: Current Outpatient Prescriptions  Medication Sig Dispense Refill  . gabapentin (NEURONTIN) 100 MG capsule 3 tablets at bedtime    . metoprolol succinate (TOPROL-XL) 25 MG 24 hr tablet Take 0.5 tablets (12.5 mg total) by mouth daily. 45 tablet 3  . Multiple Vitamin (MULTIVITAMIN WITH MINERALS) TABS Take 1 tablet by mouth daily.    Marland Kitchen osimertinib mesylate (TAGRISSO) 80 MG tablet Take 1 tablet (80 mg total) by mouth daily. 30 tablet 2  . rivaroxaban (XARELTO) 20 MG TABS tablet Take 20 mg by mouth  daily with supper.    . rosuvastatin (CRESTOR) 5 MG tablet Take 1 tablet by mouth daily.  0  . CALCIUM PO Take 1 tablet by mouth 2 (two) times daily.    . celecoxib (CELEBREX) 200 MG capsule take 1 capsule by mouth once daily if needed for NECK PAIN OR ARTHRITIS  0   No current facility-administered medications for this encounter.     Physical Findings:  height is 5\' 7"  (1.702 m) and weight is 169 lb 9.6 oz (76.9 kg). Her oral temperature is 97.7 F (36.5 C). Her blood pressure is 127/73 and her pulse is 93. Her respiration is 18 and oxygen saturation is 99%.  Pain Assessment Pain Score: 0-No pain/10 In general this is a well appearing African American female in no acute distress. She's alert and oriented x4 and appropriate throughout the examination. Cardiopulmonary assessment is negative for acute distress and she exhibits normal effort. No focal neurologic changes are noted grossly.  Lab Findings: Lab Results  Component Value Date   WBC 5.1 05/01/2017   HGB 13.2 05/01/2017   HCT 39.1 05/01/2017   MCV 89.5 05/01/2017   PLT 145 05/01/2017     Radiographic Findings: No results found.  Impression/Plan: 1. Recurrent metastatic Stage IA NSCLC, adenocarcinoma of the left upper lobe with disease in the liver and brain.  The patient appears to be  doing well since completing radiotherapy. She will continue in our brain oncology conferences and will be due for her first MRI 3 months from completing treatment. She will continue Tagrisso with Dr. Julien Nordmann as well.   2. Right knee pain. The patient was advised to discuss her questions of taking celebrex with tagrisso and xarelto with Dr. Julien Nordmann. We will follow this expectantly.     Carola Rhine, PAC

## 2017-06-01 ENCOUNTER — Other Ambulatory Visit (HOSPITAL_BASED_OUTPATIENT_CLINIC_OR_DEPARTMENT_OTHER): Payer: Medicare Other

## 2017-06-01 ENCOUNTER — Telehealth: Payer: Self-pay | Admitting: Internal Medicine

## 2017-06-01 ENCOUNTER — Ambulatory Visit (HOSPITAL_BASED_OUTPATIENT_CLINIC_OR_DEPARTMENT_OTHER): Payer: Medicare Other | Admitting: Internal Medicine

## 2017-06-01 ENCOUNTER — Encounter: Payer: Self-pay | Admitting: Internal Medicine

## 2017-06-01 VITALS — BP 128/64 | HR 77 | Temp 97.9°F | Resp 18 | Ht 67.0 in | Wt 169.2 lb

## 2017-06-01 DIAGNOSIS — C3492 Malignant neoplasm of unspecified part of left bronchus or lung: Secondary | ICD-10-CM

## 2017-06-01 DIAGNOSIS — C7931 Secondary malignant neoplasm of brain: Secondary | ICD-10-CM

## 2017-06-01 DIAGNOSIS — C3412 Malignant neoplasm of upper lobe, left bronchus or lung: Secondary | ICD-10-CM

## 2017-06-01 DIAGNOSIS — R160 Hepatomegaly, not elsewhere classified: Secondary | ICD-10-CM

## 2017-06-01 DIAGNOSIS — Z5111 Encounter for antineoplastic chemotherapy: Secondary | ICD-10-CM

## 2017-06-01 DIAGNOSIS — C787 Secondary malignant neoplasm of liver and intrahepatic bile duct: Secondary | ICD-10-CM

## 2017-06-01 LAB — COMPREHENSIVE METABOLIC PANEL
ALT: 43 U/L (ref 0–55)
ANION GAP: 7 meq/L (ref 3–11)
AST: 42 U/L — ABNORMAL HIGH (ref 5–34)
Albumin: 3.6 g/dL (ref 3.5–5.0)
Alkaline Phosphatase: 328 U/L — ABNORMAL HIGH (ref 40–150)
BILIRUBIN TOTAL: 0.61 mg/dL (ref 0.20–1.20)
BUN: 22 mg/dL (ref 7.0–26.0)
CO2: 26 meq/L (ref 22–29)
CREATININE: 1.2 mg/dL — AB (ref 0.6–1.1)
Calcium: 9.7 mg/dL (ref 8.4–10.4)
Chloride: 106 mEq/L (ref 98–109)
EGFR: 47 mL/min/{1.73_m2} — ABNORMAL LOW (ref >90)
Glucose: 91 mg/dl (ref 70–140)
Potassium: 4.4 mEq/L (ref 3.5–5.1)
Sodium: 139 mEq/L (ref 136–145)
TOTAL PROTEIN: 7.6 g/dL (ref 6.4–8.3)

## 2017-06-01 LAB — CBC WITH DIFFERENTIAL/PLATELET
BASO%: 0.1 % (ref 0.0–2.0)
Basophils Absolute: 0 10*3/uL (ref 0.0–0.1)
EOS ABS: 0.2 10*3/uL (ref 0.0–0.5)
EOS%: 3.3 % (ref 0.0–7.0)
HEMATOCRIT: 39.3 % (ref 34.8–46.6)
HGB: 13.1 g/dL (ref 11.6–15.9)
LYMPH#: 1.8 10*3/uL (ref 0.9–3.3)
LYMPH%: 30.3 % (ref 14.0–49.7)
MCH: 30.3 pg (ref 25.1–34.0)
MCHC: 33.4 g/dL (ref 31.5–36.0)
MCV: 90.8 fL (ref 79.5–101.0)
MONO#: 0.6 10*3/uL (ref 0.1–0.9)
MONO%: 11 % (ref 0.0–14.0)
NEUT%: 55.3 % (ref 38.4–76.8)
NEUTROS ABS: 3.2 10*3/uL (ref 1.5–6.5)
PLATELETS: 147 10*3/uL (ref 145–400)
RBC: 4.33 10*6/uL (ref 3.70–5.45)
RDW: 15.8 % — ABNORMAL HIGH (ref 11.2–14.5)
WBC: 5.8 10*3/uL (ref 3.9–10.3)

## 2017-06-01 MED FILL — TAGRISSO 80 MG TABLET: 80 | 30 days supply | Qty: 30 | Fill #0

## 2017-06-01 NOTE — Progress Notes (Signed)
[facility] Telephone:(336) [phone]   Fax:(336) [phone]  OFFICE PROGRESS NOTE  Derinda Late, MD [place] [address]  DIAGNOSIS: Stage IV (T1a, N2, M1 B) non-small cell lung cancer, adenocarcinoma with positive EGFR mutation with deletion in exon 19 diagnosed initially in January 2017, with metastatic disease in May 2018 based on liver biopsy.  PRIOR THERAPY:  1) Stereotactic body radiotherapy to the left upper lobe lung nodule under the care of Dr. Pablo Ledger without biopsy completed 12/14/2015. 2) stereotactic radiotherapy to a solitary brain metastasis under the care of Dr. Lisbeth Renshaw on 04/21/2017.  CURRENT THERAPY: Tagrisso 80 mg by mouth daily, first dose started 04/09/2017.  INTERVAL HISTORY: [name] 83 y.o. female returns to the clinic today for follow-up visit accompanied by her son. The patient is feeling fine today with no specific complaints except for very mild skin rash on the back of the neck. She also has some arthralgias and she is currently taking Celebrex. She denied having any chest pain, shortness of breath, cough or hemoptysis. She denied having any fever or chills. She has no nausea, vomiting, diarrhea or constipation. The patient denied having any significant weight loss or night sweats. She is here today for evaluation and repeat blood work.   MEDICAL HISTORY: Past Medical History:  Diagnosis Date  . Adenocarcinoma of left lung, stage 4 ([place]) 03/18/2017  . Arthritis    Neck,shoulder  . Brain metastases ([place]) 04/06/2017  . Colonic polyp   . Difficulty sleeping   . Encounter for antineoplastic chemotherapy 04/06/2017  . History of radiation therapy 12/04/15-12/14/15   left upper lobe 60 Gy  . Hx of unilateral nephrectomy 1994   right   . Hyperlipemia   . Lung cancer ([place])   . Numbness in feet   . Peripheral neuropathy   . Personal history of PE (pulmonary embolism) 2011    ALLERGIES:  is allergic to  penicillins.  MEDICATIONS:  Current Outpatient Prescriptions  Medication Sig Dispense Refill  . CALCIUM PO Take 1 tablet by mouth 2 (two) times daily.    . celecoxib (CELEBREX) 200 MG capsule take 1 capsule by mouth once daily if needed for NECK PAIN OR ARTHRITIS  0  . gabapentin (NEURONTIN) 100 MG capsule 3 tablets at bedtime    . metoprolol succinate (TOPROL-XL) 25 MG 24 hr tablet Take 0.5 tablets (12.5 mg total) by mouth daily. 45 tablet 3  . Multiple Vitamin (MULTIVITAMIN WITH MINERALS) TABS Take 1 tablet by mouth daily.    Marland Kitchen osimertinib mesylate (TAGRISSO) 80 MG tablet Take 1 tablet (80 mg total) by mouth daily. 30 tablet 2  . rivaroxaban (XARELTO) 20 MG TABS tablet Take 20 mg by mouth daily with supper.    . rosuvastatin (CRESTOR) 5 MG tablet Take 1 tablet by mouth daily.  0   No current facility-administered medications for this visit.     SURGICAL HISTORY:  Past Surgical History:  Procedure Laterality Date  . ABDOMINAL HYSTERECTOMY    . APPENDECTOMY    . cataracts Bilateral 09/2014   removal  . CESAREAN SECTION    . NEPHRECTOMY  1993   RT -   . TOTAL HIP ARTHROPLASTY Right 02/25/2013   Procedure: RIGHT TOTAL HIP ARTHROPLASTY ANTERIOR APPROACH;  Surgeon: Mcarthur Rossetti, MD;  Location: [facility];  Service: Orthopedics;  Laterality: Right;    REVIEW OF SYSTEMS:  A comprehensive review of systems was negative except for: Musculoskeletal: positive for arthralgias  PHYSICAL EXAMINATION: General appearance: alert, cooperative and no distress Head: Normocephalic, without obvious abnormality, atraumatic Neck: no adenopathy, no JVD, supple, symmetrical, trachea midline and thyroid not enlarged, symmetric, no tenderness/mass/nodules Lymph nodes: Cervical, supraclavicular, and axillary nodes normal. Resp: clear to auscultation bilaterally Back: symmetric, no curvature. ROM normal. No CVA tenderness. Cardio: regular rate and rhythm, S1, S2 normal, no murmur, click, rub or  gallop GI: soft, non-tender; bowel sounds normal; no masses,  no organomegaly Extremities: extremities normal, atraumatic, no cyanosis or edema  ECOG PERFORMANCE STATUS: 1 - Symptomatic but completely ambulatory  Blood pressure 128/64, pulse 77, temperature 97.9 F (36.6 C), temperature source Oral, resp. rate 18, height '5\' 7"'  (1.702 m), weight 169 lb 3.2 oz (76.7 kg), SpO2 98 %.  LABORATORY DATA: Lab Results  Component Value Date   WBC 5.8 06/01/2017   HGB 13.1 06/01/2017   HCT 39.3 06/01/2017   MCV 90.8 06/01/2017   PLT 147 06/01/2017      Chemistry      Component Value Date/Time   NA 139 06/01/2017 0850   K 4.4 06/01/2017 0850   CL 105 02/26/2013 0553   CO2 26 06/01/2017 0850   BUN 22.0 06/01/2017 0850   CREATININE 1.2 (H) 06/01/2017 0850      Component Value Date/Time   CALCIUM 9.7 06/01/2017 0850   ALKPHOS 328 (H) 06/01/2017 0850   AST 42 (H) 06/01/2017 0850   ALT 43 06/01/2017 0850   BILITOT 0.61 06/01/2017 0850       RADIOGRAPHIC STUDIES: No results found.  ASSESSMENT AND PLAN:  This is a very pleasant 83 years old African-American female with metastatic non-small cell lung cancer that was initially diagnosed as a stage IA status post stereotactic radiotherapy of the left upper lobe and now she presented with disease recurrence as well as mediastinal lymphadenopathy and liver metastasis and solitary brain metastasis. 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 She underwent stereotactic radiotherapy to a solitary brain metastasis. She is currently on treatment with Tagrisso 80 mg by mouth daily status post 2 months. She continues to tolerate the treatment fairly well. I recommended for her to continue her current treatment with Tagrisso with the same dose. I would see her back for follow-up visit in one month's for evaluation after repeating CT scan of the chest, abdomen and pelvis for restaging of her disease. The patient voices  understanding of current disease status and treatment options and is in agreement with the current care plan. All questions were answered. The patient knows to call the clinic with any problems, questions or concerns. We can certainly see the patient much sooner if necessary. I spent 10 minutes counseling the patient face to face. The total time spent in the appointment was 15 minutes.  Disclaimer: This note was dictated with voice recognition software. Similar sounding words can inadvertently be transcribed and may not be corrected upon review.

## 2017-06-01 NOTE — Telephone Encounter (Signed)
Gave patient avs and calendars regarding her upcoming appointments.   [facility] should be following up with her regarding her CT

## 2017-06-23 ENCOUNTER — Other Ambulatory Visit: Payer: Self-pay | Admitting: Internal Medicine

## 2017-06-23 DIAGNOSIS — C3492 Malignant neoplasm of unspecified part of left bronchus or lung: Secondary | ICD-10-CM

## 2017-06-23 DIAGNOSIS — Z5111 Encounter for antineoplastic chemotherapy: Secondary | ICD-10-CM

## 2017-06-23 DIAGNOSIS — Z7189 Other specified counseling: Secondary | ICD-10-CM

## 2017-06-23 DIAGNOSIS — C7931 Secondary malignant neoplasm of brain: Secondary | ICD-10-CM

## 2017-06-26 ENCOUNTER — Other Ambulatory Visit: Payer: Medicare Other

## 2017-06-26 ENCOUNTER — Other Ambulatory Visit (HOSPITAL_BASED_OUTPATIENT_CLINIC_OR_DEPARTMENT_OTHER): Payer: Medicare Other

## 2017-06-26 ENCOUNTER — Ambulatory Visit (HOSPITAL_COMMUNITY)
Admission: RE | Admit: 2017-06-26 | Discharge: 2017-06-26 | Disposition: A | Discharge status: Home / Self Care | Payer: Medicare Other | Source: Ambulatory Visit | Attending: Internal Medicine | Admitting: Internal Medicine

## 2017-06-26 DIAGNOSIS — C3412 Malignant neoplasm of upper lobe, left bronchus or lung: Secondary | ICD-10-CM | POA: Diagnosis not present

## 2017-06-26 DIAGNOSIS — Z5111 Encounter for antineoplastic chemotherapy: Secondary | ICD-10-CM

## 2017-06-26 DIAGNOSIS — C7931 Secondary malignant neoplasm of brain: Secondary | ICD-10-CM | POA: Insufficient documentation

## 2017-06-26 DIAGNOSIS — C3492 Malignant neoplasm of unspecified part of left bronchus or lung: Secondary | ICD-10-CM

## 2017-06-26 DIAGNOSIS — C787 Secondary malignant neoplasm of liver and intrahepatic bile duct: Secondary | ICD-10-CM | POA: Diagnosis not present

## 2017-06-26 DIAGNOSIS — Z905 Acquired absence of kidney: Secondary | ICD-10-CM | POA: Insufficient documentation

## 2017-06-26 DIAGNOSIS — I700 Atherosclerosis of aorta: Secondary | ICD-10-CM | POA: Insufficient documentation

## 2017-06-26 LAB — COMPREHENSIVE METABOLIC PANEL
ALT: 53 U/L (ref 0–55)
ANION GAP: 8 meq/L (ref 3–11)
AST: 55 U/L — AB (ref 5–34)
Albumin: 3.7 g/dL (ref 3.5–5.0)
Alkaline Phosphatase: 384 U/L — ABNORMAL HIGH (ref 40–150)
BILIRUBIN TOTAL: 0.67 mg/dL (ref 0.20–1.20)
BUN: 19.7 mg/dL (ref 7.0–26.0)
CALCIUM: 9.8 mg/dL (ref 8.4–10.4)
CHLORIDE: 104 meq/L (ref 98–109)
CO2: 26 mEq/L (ref 22–29)
CREATININE: 1.2 mg/dL — AB (ref 0.6–1.1)
EGFR: 46 mL/min/{1.73_m2} — ABNORMAL LOW (ref >90)
Glucose: 95 mg/dl (ref 70–140)
Potassium: 4.8 mEq/L (ref 3.5–5.1)
Sodium: 138 mEq/L (ref 136–145)
TOTAL PROTEIN: 7.9 g/dL (ref 6.4–8.3)

## 2017-06-26 LAB — CBC WITH DIFFERENTIAL/PLATELET
BASO%: 0.9 % (ref 0.0–2.0)
Basophils Absolute: 0.1 10*3/uL (ref 0.0–0.1)
EOS%: 4 % (ref 0.0–7.0)
Eosinophils Absolute: 0.2 10*3/uL (ref 0.0–0.5)
HEMATOCRIT: 38.4 % (ref 34.8–46.6)
HEMOGLOBIN: 12.8 g/dL (ref 11.6–15.9)
LYMPH#: 1.8 10*3/uL (ref 0.9–3.3)
LYMPH%: 32.2 % (ref 14.0–49.7)
MCH: 30.2 pg (ref 25.1–34.0)
MCHC: 33.4 g/dL (ref 31.5–36.0)
MCV: 90.6 fL (ref 79.5–101.0)
MONO#: 0.7 10*3/uL (ref 0.1–0.9)
MONO%: 12.3 % (ref 0.0–14.0)
NEUT%: 50.6 % (ref 38.4–76.8)
NEUTROS ABS: 2.8 10*3/uL (ref 1.5–6.5)
PLATELETS: 150 10*3/uL (ref 145–400)
RBC: 4.24 10*6/uL (ref 3.70–5.45)
RDW: 15.7 % — AB (ref 11.2–14.5)
WBC: 5.5 10*3/uL (ref 3.9–10.3)

## 2017-06-26 MED ORDER — IOPAMIDOL (ISOVUE-300) INJECTION 61%
INTRAVENOUS | Status: AC
  Filled 2017-06-26: qty 100

## 2017-06-26 MED ORDER — IOPAMIDOL (ISOVUE-300) INJECTION 61%
100.0000 mL | Freq: Once | INTRAVENOUS | Status: AC | PRN
  Administered 2017-06-26: 100 mL via INTRAVENOUS

## 2017-06-29 MED FILL — TAGRISSO 80 MG TABLET: 80 | 30 days supply | Qty: 30 | Fill #0

## 2017-06-30 ENCOUNTER — Ambulatory Visit (HOSPITAL_BASED_OUTPATIENT_CLINIC_OR_DEPARTMENT_OTHER): Payer: Medicare Other | Admitting: Internal Medicine

## 2017-06-30 ENCOUNTER — Encounter: Payer: Self-pay | Admitting: Internal Medicine

## 2017-06-30 VITALS — BP 130/76 | HR 92 | Temp 97.8°F | Resp 18 | Ht 67.0 in | Wt 169.5 lb

## 2017-06-30 DIAGNOSIS — C3412 Malignant neoplasm of upper lobe, left bronchus or lung: Secondary | ICD-10-CM

## 2017-06-30 DIAGNOSIS — R160 Hepatomegaly, not elsewhere classified: Secondary | ICD-10-CM

## 2017-06-30 DIAGNOSIS — Z7189 Other specified counseling: Secondary | ICD-10-CM

## 2017-06-30 DIAGNOSIS — C7931 Secondary malignant neoplasm of brain: Secondary | ICD-10-CM

## 2017-06-30 DIAGNOSIS — C787 Secondary malignant neoplasm of liver and intrahepatic bile duct: Secondary | ICD-10-CM

## 2017-06-30 DIAGNOSIS — C3492 Malignant neoplasm of unspecified part of left bronchus or lung: Secondary | ICD-10-CM

## 2017-06-30 DIAGNOSIS — Z5111 Encounter for antineoplastic chemotherapy: Secondary | ICD-10-CM

## 2017-06-30 NOTE — Progress Notes (Signed)
[facility] [phone]   Fax:(336) [phone]  OFFICE PROGRESS NOTE  Derinda Late, MD [place] [address]  DIAGNOSIS: Stage IV (T1a, N2, M1 B) non-small cell lung cancer, adenocarcinoma with positive EGFR mutation with deletion in exon 19 diagnosed initially in January 2017, with metastatic disease in May 2018 based on liver biopsy.  PRIOR THERAPY:  1) Stereotactic body radiotherapy to the left upper lobe lung nodule under the care of Dr. Pablo Ledger without biopsy completed 12/14/2015. 2) stereotactic radiotherapy to a solitary brain metastasis under the care of Dr. Lisbeth Renshaw on 04/21/2017. 3) Tagrisso 80 mg by mouth daily, first dose started 04/09/2017.  CURRENT THERAPY: Tagrisso 80 mg by mouth daily and Avastin 15 MG/KG every 3 weeks was added starting 07/09/2017 secondary to disease progression on single agent Tagrisso.  INTERVAL HISTORY: [name] 83 y.o. female returns to the clinic today for follow-up visit. The patient is feeling fine today with no specific complaints except for generalized fatigue. She denied having any significant chest pain, shortness breath, cough or hemoptysis. She denied having any fever or chills. She has no nausea, vomiting, diarrhea or constipation. She denied having any significant skin rash. She has been tolerating her treatment with Tagrisso fairly well. She had repeat CT scan of the chest, abdomen and pelvis performed recently and she is here for evaluation and discussion of her scan results.  MEDICAL HISTORY: Past Medical History:  Diagnosis Date  . Adenocarcinoma of left lung, stage 4 ([facility]) 03/18/2017  . Arthritis    Neck,shoulder  . Brain metastases ([facility]) 04/06/2017  . Colonic polyp   . Difficulty sleeping   . Encounter for antineoplastic chemotherapy 04/06/2017  . History of radiation therapy 12/04/15-12/14/15   left upper lobe 60 Gy  . Hx of unilateral nephrectomy 1994   right   .  Hyperlipemia   . Lung cancer ([facility])   . Numbness in feet   . Peripheral neuropathy   . Personal history of PE (pulmonary embolism) 2011    ALLERGIES:  is allergic to penicillins.  MEDICATIONS:  Current Outpatient Prescriptions  Medication Sig Dispense Refill  . CALCIUM PO Take 1 tablet by mouth 2 (two) times daily.    . celecoxib (CELEBREX) 200 MG capsule take 1 capsule by mouth once daily if needed for NECK PAIN OR ARTHRITIS  0  . gabapentin (NEURONTIN) 100 MG capsule 3 tablets at bedtime    . metoprolol succinate (TOPROL-XL) 25 MG 24 hr tablet Take 0.5 tablets (12.5 mg total) by mouth daily. 45 tablet 3  . Multiple Vitamin (MULTIVITAMIN WITH MINERALS) TABS Take 1 tablet by mouth daily.    . rivaroxaban (XARELTO) 20 MG TABS tablet Take 20 mg by mouth daily with supper.    Marland Kitchen TAGRISSO 80 MG tablet TAKE 1 TABLET (80 MG TOTAL) BY MOUTH DAILY. 30 tablet 0   No current facility-administered medications for this visit.     SURGICAL HISTORY:  Past Surgical History:  Procedure Laterality Date  . ABDOMINAL HYSTERECTOMY    . APPENDECTOMY    . cataracts Bilateral 09/2014   removal  . CESAREAN SECTION    . NEPHRECTOMY  1993   RT -   . TOTAL HIP ARTHROPLASTY Right 02/25/2013   Procedure: RIGHT TOTAL HIP ARTHROPLASTY ANTERIOR APPROACH;  Surgeon: Mcarthur Rossetti, MD;  Location: [facility];  Service: Orthopedics;  Laterality: Right;    REVIEW OF SYSTEMS:  Constitutional: positive for fatigue Eyes: negative Ears,  nose, mouth, throat, and face: negative Respiratory: negative Cardiovascular: negative Gastrointestinal: negative Genitourinary:negative Integument/breast: negative Hematologic/lymphatic: negative Musculoskeletal:negative Neurological: negative Behavioral/Psych: negative Endocrine: negative Allergic/Immunologic: negative   PHYSICAL EXAMINATION: General appearance: alert, cooperative, fatigued and no distress Head: Normocephalic, without obvious abnormality,  atraumatic Neck: no adenopathy, no JVD, supple, symmetrical, trachea midline and thyroid not enlarged, symmetric, no tenderness/mass/nodules Lymph nodes: Cervical, supraclavicular, and axillary nodes normal. Resp: clear to auscultation bilaterally Back: symmetric, no curvature. ROM normal. No CVA tenderness. Cardio: regular rate and rhythm, S1, S2 normal, no murmur, click, rub or gallop GI: soft, non-tender; bowel sounds normal; no masses,  no organomegaly Extremities: extremities normal, atraumatic, no cyanosis or edema Neurologic: Alert and oriented X 3, normal strength and tone. Normal symmetric reflexes. Normal coordination and gait  ECOG PERFORMANCE STATUS: 1 - Symptomatic but completely ambulatory  Blood pressure 130/76, pulse 92, temperature 97.8 F (36.6 C), temperature source Oral, resp. rate 18, height '5\' 7"'  (1.702 m), weight 169 lb 8 oz (76.9 kg), SpO2 99 %.  LABORATORY DATA: Lab Results  Component Value Date   WBC 5.5 06/26/2017   HGB 12.8 06/26/2017   HCT 38.4 06/26/2017   MCV 90.6 06/26/2017   PLT 150 06/26/2017      Chemistry      Component Value Date/Time   NA 138 06/26/2017 1006   K 4.8 06/26/2017 1006   CL 105 02/26/2013 0553   CO2 26 06/26/2017 1006   BUN 19.7 06/26/2017 1006   CREATININE 1.2 (H) 06/26/2017 1006      Component Value Date/Time   CALCIUM 9.8 06/26/2017 1006   ALKPHOS 384 (H) 06/26/2017 1006   AST 55 (H) 06/26/2017 1006   ALT 53 06/26/2017 1006   BILITOT 0.67 06/26/2017 1006       RADIOGRAPHIC STUDIES: Ct Chest W Contrast  Result Date: 06/26/2017 CLINICAL DATA:  Non-small-cell lung cancer metastatic to liver and brain. EXAM: CT CHEST, ABDOMEN, AND PELVIS WITH CONTRAST TECHNIQUE: Multidetector CT imaging of the chest, abdomen and pelvis was performed following the standard protocol during bolus administration of intravenous contrast. CONTRAST:  171m ISOVUE-300 IOPAMIDOL (ISOVUE-300) INJECTION 61% COMPARISON:  PET-CT 04/02/2017. Chest CT  01/28/2017. Abdominal MRI head 02/14/2017. FINDINGS: CT CHEST FINDINGS Cardiovascular: The heart size is normal. No pericardial effusion. Coronary artery calcification is evident. Atherosclerotic calcification is noted in the wall of the thoracic aorta. Mediastinum/Nodes: No mediastinal lymphadenopathy. There is no hilar lymphadenopathy. The esophagus has normal imaging features. There is no axillary lymphadenopathy. 12 mm left thyroid nodule not well seen on prior studies performed without intravenous contrast material. This did not appear to be hypermetabolic on the prior PET-CT. Lungs/Pleura: Left apical nodule measures 1.4 x 1.3 cm today compared to 1.4 x 1.1 cm on prior PET-CT. Stable volume loss left apex. 10 x 6 mm ground-glass nodule right mid lung is stable. Scarring at the left base with a tiny associated nodule (image 108 series 4) is unchanged. Musculoskeletal: Bone windows reveal no worrisome lytic or sclerotic osseous lesions. CT ABDOMEN PELVIS FINDINGS Hepatobiliary: Dominant right liver lesion measures 5.1 x 5.9 cm today compared to 4.2 x 5.0 cm on prior PET-CT. 11 x 16 mm lesion on prior PET-CT is now 16 x 19 mm. Multiple small scattered liver lesions are identified with several measuring in the 10 mm size range. These are not visible on prior studies including MRI of 02/14/2017. There is no evidence for gallstones, gallbladder wall thickening, or pericholecystic fluid. No intrahepatic or extrahepatic biliary dilation. Pancreas: No  focal mass lesion. No dilatation of the main duct. No intraparenchymal cyst. No peripancreatic edema. Spleen: No splenomegaly. No focal mass lesion. Adrenals/Urinary Tract: No adrenal nodule or mass. Right kidney surgically absent. Left kidney unremarkable. Left ureter unremarkable. Bladder largely obscured by streak artifact from right hip replacement. Stomach/Bowel: Tiny hiatal hernia. Unremarkable otherwise. Large duodenal diverticuli evident. No small bowel wall  thickening. No small bowel dilatation. The terminal ileum is normal. The appendix is not visualized, but there is no edema or inflammation in the region of the cecum. Diverticuli are seen scattered along the entire length of the colon without CT findings of diverticulitis. Vascular/Lymphatic: There is abdominal aortic atherosclerosis without aneurysm. There is no gastrohepatic or hepatoduodenal ligament lymphadenopathy. No intraperitoneal or retroperitoneal lymphadenopathy. No pelvic sidewall lymphadenopathy. Reproductive: Uterus surgically absent.  There is no adnexal mass. Other: No intraperitoneal free fluid. Musculoskeletal: Status post right total hip replacement. Bone windows reveal no worrisome lytic or sclerotic osseous lesions. IMPRESSION: 1. Interval progression of pre-existing liver metastases with new liver metastases evident on today's exam. 2. No substantial change left apical lesion consistent with known neoplasm. 3. Status post right nephrectomy. 4.  Aortic Atherosclerois (ICD10-170.0) Electronically Signed   By: Misty Stanley M.D.   On: 06/26/2017 13:57   Ct Abdomen Pelvis W Contrast  Result Date: 06/26/2017 CLINICAL DATA:  Non-small-cell lung cancer metastatic to liver and brain. EXAM: CT CHEST, ABDOMEN, AND PELVIS WITH CONTRAST TECHNIQUE: Multidetector CT imaging of the chest, abdomen and pelvis was performed following the standard protocol during bolus administration of intravenous contrast. CONTRAST:  161m ISOVUE-300 IOPAMIDOL (ISOVUE-300) INJECTION 61% COMPARISON:  PET-CT 04/02/2017. Chest CT 01/28/2017. Abdominal MRI head 02/14/2017. FINDINGS: CT CHEST FINDINGS Cardiovascular: The heart size is normal. No pericardial effusion. Coronary artery calcification is evident. Atherosclerotic calcification is noted in the wall of the thoracic aorta. Mediastinum/Nodes: No mediastinal lymphadenopathy. There is no hilar lymphadenopathy. The esophagus has normal imaging features. There is no axillary  lymphadenopathy. 12 mm left thyroid nodule not well seen on prior studies performed without intravenous contrast material. This did not appear to be hypermetabolic on the prior PET-CT. Lungs/Pleura: Left apical nodule measures 1.4 x 1.3 cm today compared to 1.4 x 1.1 cm on prior PET-CT. Stable volume loss left apex. 10 x 6 mm ground-glass nodule right mid lung is stable. Scarring at the left base with a tiny associated nodule (image 108 series 4) is unchanged. Musculoskeletal: Bone windows reveal no worrisome lytic or sclerotic osseous lesions. CT ABDOMEN PELVIS FINDINGS Hepatobiliary: Dominant right liver lesion measures 5.1 x 5.9 cm today compared to 4.2 x 5.0 cm on prior PET-CT. 11 x 16 mm lesion on prior PET-CT is now 16 x 19 mm. Multiple small scattered liver lesions are identified with several measuring in the 10 mm size range. These are not visible on prior studies including MRI of 02/14/2017. There is no evidence for gallstones, gallbladder wall thickening, or pericholecystic fluid. No intrahepatic or extrahepatic biliary dilation. Pancreas: No focal mass lesion. No dilatation of the main duct. No intraparenchymal cyst. No peripancreatic edema. Spleen: No splenomegaly. No focal mass lesion. Adrenals/Urinary Tract: No adrenal nodule or mass. Right kidney surgically absent. Left kidney unremarkable. Left ureter unremarkable. Bladder largely obscured by streak artifact from right hip replacement. Stomach/Bowel: Tiny hiatal hernia. Unremarkable otherwise. Large duodenal diverticuli evident. No small bowel wall thickening. No small bowel dilatation. The terminal ileum is normal. The appendix is not visualized, but there is no edema or inflammation in the region of  the cecum. Diverticuli are seen scattered along the entire length of the colon without CT findings of diverticulitis. Vascular/Lymphatic: There is abdominal aortic atherosclerosis without aneurysm. There is no gastrohepatic or hepatoduodenal ligament  lymphadenopathy. No intraperitoneal or retroperitoneal lymphadenopathy. No pelvic sidewall lymphadenopathy. Reproductive: Uterus surgically absent.  There is no adnexal mass. Other: No intraperitoneal free fluid. Musculoskeletal: Status post right total hip replacement. Bone windows reveal no worrisome lytic or sclerotic osseous lesions. IMPRESSION: 1. Interval progression of pre-existing liver metastases with new liver metastases evident on today's exam. 2. No substantial change left apical lesion consistent with known neoplasm. 3. Status post right nephrectomy. 4.  Aortic Atherosclerois (ICD10-170.0) Electronically Signed   By: Misty Stanley M.D.   On: 06/26/2017 13:57    ASSESSMENT AND PLAN:  This is a very pleasant 83 years old African-American female with metastatic non-small cell lung cancer that was initially diagnosed as a stage IA status post stereotactic radiotherapy of the left upper lobe and now she presented with disease recurrence as well as mediastinal lymphadenopathy and liver metastasis and solitary brain metastasis. 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 She underwent stereotactic radiotherapy to a solitary brain metastasis. She is currently on treatment with Tagrisso 80 mg by mouth daily status post 3 months. The patient is tolerating her treatment fairly well with no significant adverse effects. She had repeat CT scan of the chest, abdomen and pelvis performed recently. I personally and independently reviewed the scan images and discuss the results with the patient and her son. Unfortunately her scan showed further progression of her disease especially in the liver. I discussed with the patient several options for management of her condition including continuation of treatment with single agent Tagrisso for 2 more months and repeat imaging studies before making a decision regarding discontinuation of this targeted therapy versus adding another  biologic Avastin 15 MG/M2 every 3 weeks which has shown in many clinical trial to enhance the effect of EGFR tyrosine kinase inhibitors. She may not be a good candidate for systemic chemotherapy because of her age and comorbidities. The patient and her son would like to proceed with the treatment with Tagrisso and Avastin. I discussed with them the adverse effect of the treatment with Avastin including increased risk for pulmonary hemorrhage, GI perforation, wound healing delay as well as hypertension and proteinuria. She is expected to start the first dose of this treatment on 07/09/2017. I will see the patient back for follow-up visit in one month's for reevaluation and management of any adverse effect of her treatment. The patient was advised to call immediately if she has any concerning symptoms in the interval. The patient voices understanding of current disease status and treatment options and is in agreement with the current care plan. All questions were answered. The patient knows to call the clinic with any problems, questions or concerns. We can certainly see the patient much sooner if necessary.  Disclaimer: This note was dictated with voice recognition software. Similar sounding words can inadvertently be transcribed and may not be corrected upon review.

## 2017-07-02 ENCOUNTER — Telehealth: Payer: Self-pay | Admitting: Hematology

## 2017-07-02 NOTE — Telephone Encounter (Signed)
Scheduled appt per 9/11 los - patient is aware of appts added and will pick up a new schedule 9/20

## 2017-07-09 ENCOUNTER — Ambulatory Visit (HOSPITAL_BASED_OUTPATIENT_CLINIC_OR_DEPARTMENT_OTHER): Payer: Medicare Other

## 2017-07-09 ENCOUNTER — Other Ambulatory Visit (HOSPITAL_BASED_OUTPATIENT_CLINIC_OR_DEPARTMENT_OTHER): Payer: Medicare Other

## 2017-07-09 VITALS — BP 120/67 | HR 74 | Temp 98.0°F | Resp 16

## 2017-07-09 DIAGNOSIS — C3412 Malignant neoplasm of upper lobe, left bronchus or lung: Secondary | ICD-10-CM

## 2017-07-09 DIAGNOSIS — C787 Secondary malignant neoplasm of liver and intrahepatic bile duct: Secondary | ICD-10-CM

## 2017-07-09 DIAGNOSIS — Z5112 Encounter for antineoplastic immunotherapy: Secondary | ICD-10-CM | POA: Diagnosis not present

## 2017-07-09 DIAGNOSIS — C3492 Malignant neoplasm of unspecified part of left bronchus or lung: Secondary | ICD-10-CM

## 2017-07-09 LAB — COMPREHENSIVE METABOLIC PANEL
ALT: 61 U/L — ABNORMAL HIGH (ref 0–55)
AST: 53 U/L — ABNORMAL HIGH (ref 5–34)
Albumin: 3.6 g/dL (ref 3.5–5.0)
Alkaline Phosphatase: 470 U/L — ABNORMAL HIGH (ref 40–150)
Anion Gap: 8 mEq/L (ref 3–11)
BUN: 18.5 mg/dL (ref 7.0–26.0)
CHLORIDE: 107 meq/L (ref 98–109)
CO2: 21 meq/L — AB (ref 22–29)
Calcium: 9.6 mg/dL (ref 8.4–10.4)
Creatinine: 1.1 mg/dL (ref 0.6–1.1)
EGFR: 52 mL/min/{1.73_m2} — AB (ref >90)
GLUCOSE: 106 mg/dL (ref 70–140)
POTASSIUM: 4.4 meq/L (ref 3.5–5.1)
SODIUM: 136 meq/L (ref 136–145)
TOTAL PROTEIN: 8.2 g/dL (ref 6.4–8.3)
Total Bilirubin: 0.81 mg/dL (ref 0.20–1.20)

## 2017-07-09 LAB — CBC WITH DIFFERENTIAL/PLATELET
BASO%: 0.2 % (ref 0.0–2.0)
Basophils Absolute: 0 10*3/uL (ref 0.0–0.1)
EOS ABS: 0.2 10*3/uL (ref 0.0–0.5)
EOS%: 3.8 % (ref 0.0–7.0)
HCT: 37.5 % (ref 34.8–46.6)
HGB: 12.8 g/dL (ref 11.6–15.9)
LYMPH%: 31.6 % (ref 14.0–49.7)
MCH: 30.5 pg (ref 25.1–34.0)
MCHC: 34.1 g/dL (ref 31.5–36.0)
MCV: 89.5 fL (ref 79.5–101.0)
MONO#: 0.9 10*3/uL (ref 0.1–0.9)
MONO%: 15.7 % — AB (ref 0.0–14.0)
NEUT%: 48.7 % (ref 38.4–76.8)
NEUTROS ABS: 2.7 10*3/uL (ref 1.5–6.5)
Platelets: 148 10*3/uL (ref 145–400)
RBC: 4.19 10*6/uL (ref 3.70–5.45)
RDW: 15.5 % — AB (ref 11.2–14.5)
WBC: 5.5 10*3/uL (ref 3.9–10.3)
lymph#: 1.8 10*3/uL (ref 0.9–3.3)

## 2017-07-09 LAB — UA PROTEIN, DIPSTICK - CHCC: Protein, ur: NEGATIVE mg/dL

## 2017-07-09 MED ORDER — SODIUM CHLORIDE 0.9 % IV SOLN
Freq: Once | INTRAVENOUS | Status: AC
  Administered 2017-07-09: 14:00:00 via INTRAVENOUS

## 2017-07-09 MED ORDER — BEVACIZUMAB CHEMO INJECTION 400 MG/16ML
15.7000 mg/kg | Freq: Once | INTRAVENOUS | Status: AC
  Administered 2017-07-09: 1200 mg via INTRAVENOUS
  Filled 2017-07-09: qty 48

## 2017-07-09 NOTE — Patient Instructions (Addendum)
[facility] Discharge Instructions for Patients Receiving Chemotherapy  Today you received the following chemotherapy agents: Avastin.  To help prevent nausea and vomiting after your treatment, we encourage you to take your nausea medication as directed.   If you develop nausea and vomiting that is not controlled by your nausea medication, call the clinic.   BELOW ARE SYMPTOMS THAT SHOULD BE REPORTED IMMEDIATELY:  *FEVER GREATER THAN 100.5 F  *CHILLS WITH OR WITHOUT FEVER  NAUSEA AND VOMITING THAT IS NOT CONTROLLED WITH YOUR NAUSEA MEDICATION  *UNUSUAL SHORTNESS OF BREATH  *UNUSUAL BRUISING OR BLEEDING  TENDERNESS IN MOUTH AND THROAT WITH OR WITHOUT PRESENCE OF ULCERS  *URINARY PROBLEMS  *BOWEL PROBLEMS  UNUSUAL RASH Items with * indicate a potential emergency and should be followed up as soon as possible.  Feel free to call the clinic you have any questions or concerns. The clinic phone number is [phone].  Please show the [facility] at check-in to the Emergency Department and triage nurse.  Bevacizumab injection What is this medicine? BEVACIZUMAB (be va SIZ yoo mab) is a monoclonal antibody. It is used to treat many types of cancer. This medicine may be used for other purposes; ask your health care provider or pharmacist if you have questions. COMMON BRAND NAME(S): Avastin What should I tell my health care provider before I take this medicine? They need to know if you have any of these conditions: -diabetes -heart disease -high blood pressure -history of coughing up blood -prior anthracycline chemotherapy (e.g., doxorubicin, daunorubicin, epirubicin) -recent or ongoing radiation therapy -recent or planning to have surgery -stroke -an unusual or allergic reaction to bevacizumab, hamster proteins, mouse proteins, other medicines, foods, dyes, or preservatives -pregnant or trying to get pregnant -breast-feeding How should I use this  medicine? This medicine is for infusion into a vein. It is given by a health care professional in a hospital or clinic setting. Talk to your pediatrician regarding the use of this medicine in children. Special care may be needed. Overdosage: If you think you have taken too much of this medicine contact a poison control center or emergency room at once. NOTE: This medicine is only for you. Do not share this medicine with others. What if I miss a dose? It is important not to miss your dose. Call your doctor or health care professional if you are unable to keep an appointment. What may interact with this medicine? Interactions are not expected. This list may not describe all possible interactions. Give your health care provider a list of all the medicines, herbs, non-prescription drugs, or dietary supplements you use. Also tell them if you smoke, drink alcohol, or use illegal drugs. Some items may interact with your medicine. What should I watch for while using this medicine? Your condition will be monitored carefully while you are receiving this medicine. You will need important blood work and urine testing done while you are taking this medicine. This medicine may increase your risk to bruise or bleed. Call your doctor or health care professional if you notice any unusual bleeding. This medicine should be started at least 28 days following major surgery and the site of the surgery should be totally healed. Check with your doctor before scheduling dental work or surgery while you are receiving this treatment. Talk to your doctor if you have recently had surgery or if you have a wound that has not healed. Do not become pregnant while taking this medicine or for 6 months after  stopping it. Women should inform their doctor if they wish to become pregnant or think they might be pregnant. There is a potential for serious side effects to an unborn child. Talk to your health care professional or pharmacist for  more information. Do not breast-feed an infant while taking this medicine and for 6 months after the last dose. This medicine has caused ovarian failure in some women. This medicine may interfere with the ability to have a child. You should talk to your doctor or health care professional if you are concerned about your fertility. What side effects may I notice from receiving this medicine? Side effects that you should report to your doctor or health care professional as soon as possible: -allergic reactions like skin rash, itching or hives, swelling of the face, lips, or tongue -chest pain or chest tightness -chills -coughing up blood -high fever -seizures -severe constipation -signs and symptoms of bleeding such as bloody or black, tarry stools; red or dark-brown urine; spitting up blood or brown material that looks like coffee grounds; red spots on the skin; unusual bruising or bleeding from the eye, gums, or nose -signs and symptoms of a blood clot such as breathing problems; chest pain; severe, sudden headache; pain, swelling, warmth in the leg -signs and symptoms of a stroke like changes in vision; confusion; trouble speaking or understanding; severe headaches; sudden numbness or weakness of the face, arm or leg; trouble walking; dizziness; loss of balance or coordination -stomach pain -sweating -swelling of legs or ankles -vomiting -weight gain Side effects that usually do not require medical attention (report to your doctor or health care professional if they continue or are bothersome): -back pain -changes in taste -decreased appetite -dry skin -nausea -tiredness This list may not describe all possible side effects. Call your doctor for medical advice about side effects. You may report side effects to FDA at 1-800-FDA-1088. Where should I keep my medicine? This drug is given in a hospital or clinic and will not be stored at home. NOTE: This sheet is a summary. It may not cover  all possible information. If you have questions about this medicine, talk to your doctor, pharmacist, or health care provider.  2018 Elsevier/Gold Standard (2016-10-03 14:33:29)

## 2017-07-22 ENCOUNTER — Other Ambulatory Visit: Payer: Self-pay | Admitting: Radiation Therapy

## 2017-07-22 ENCOUNTER — Other Ambulatory Visit: Payer: Self-pay | Admitting: Internal Medicine

## 2017-07-22 DIAGNOSIS — C3492 Malignant neoplasm of unspecified part of left bronchus or lung: Secondary | ICD-10-CM

## 2017-07-22 DIAGNOSIS — C7931 Secondary malignant neoplasm of brain: Secondary | ICD-10-CM

## 2017-07-22 DIAGNOSIS — Z7189 Other specified counseling: Secondary | ICD-10-CM

## 2017-07-22 DIAGNOSIS — Z5111 Encounter for antineoplastic chemotherapy: Secondary | ICD-10-CM

## 2017-07-22 NOTE — Progress Notes (Signed)
Mri brain  

## 2017-07-29 ENCOUNTER — Telehealth: Payer: Self-pay | Admitting: Medical Oncology

## 2017-07-29 NOTE — Telephone Encounter (Signed)
New problem -Edema feet and ankles. Is this related to tagrisso?

## 2017-07-30 ENCOUNTER — Encounter: Payer: Self-pay | Admitting: Internal Medicine

## 2017-07-30 ENCOUNTER — Ambulatory Visit (HOSPITAL_BASED_OUTPATIENT_CLINIC_OR_DEPARTMENT_OTHER): Payer: Medicare Other | Admitting: Internal Medicine

## 2017-07-30 ENCOUNTER — Telehealth: Payer: Self-pay

## 2017-07-30 ENCOUNTER — Other Ambulatory Visit: Payer: Self-pay | Admitting: Medical Oncology

## 2017-07-30 ENCOUNTER — Ambulatory Visit (HOSPITAL_BASED_OUTPATIENT_CLINIC_OR_DEPARTMENT_OTHER): Payer: Medicare Other

## 2017-07-30 ENCOUNTER — Other Ambulatory Visit (HOSPITAL_BASED_OUTPATIENT_CLINIC_OR_DEPARTMENT_OTHER): Payer: Medicare Other

## 2017-07-30 VITALS — BP 135/65 | HR 89 | Resp 17

## 2017-07-30 VITALS — BP 133/63 | HR 86 | Temp 97.8°F | Resp 18 | Ht 67.0 in | Wt 169.1 lb

## 2017-07-30 DIAGNOSIS — Z5111 Encounter for antineoplastic chemotherapy: Secondary | ICD-10-CM

## 2017-07-30 DIAGNOSIS — C7931 Secondary malignant neoplasm of brain: Secondary | ICD-10-CM | POA: Diagnosis not present

## 2017-07-30 DIAGNOSIS — C787 Secondary malignant neoplasm of liver and intrahepatic bile duct: Secondary | ICD-10-CM | POA: Diagnosis not present

## 2017-07-30 DIAGNOSIS — C3492 Malignant neoplasm of unspecified part of left bronchus or lung: Secondary | ICD-10-CM

## 2017-07-30 DIAGNOSIS — Z5112 Encounter for antineoplastic immunotherapy: Secondary | ICD-10-CM | POA: Diagnosis not present

## 2017-07-30 DIAGNOSIS — R42 Dizziness and giddiness: Secondary | ICD-10-CM | POA: Diagnosis not present

## 2017-07-30 DIAGNOSIS — C3412 Malignant neoplasm of upper lobe, left bronchus or lung: Secondary | ICD-10-CM

## 2017-07-30 LAB — COMPREHENSIVE METABOLIC PANEL
ALT: 42 U/L (ref 0–55)
AST: 46 U/L — ABNORMAL HIGH (ref 5–34)
Albumin: 3.4 g/dL — ABNORMAL LOW (ref 3.5–5.0)
Alkaline Phosphatase: 334 U/L — ABNORMAL HIGH (ref 40–150)
Anion Gap: 9 mEq/L (ref 3–11)
BILIRUBIN TOTAL: 0.66 mg/dL (ref 0.20–1.20)
BUN: 20.1 mg/dL (ref 7.0–26.0)
CHLORIDE: 107 meq/L (ref 98–109)
CO2: 22 meq/L (ref 22–29)
Calcium: 9.2 mg/dL (ref 8.4–10.4)
Creatinine: 1.2 mg/dL — ABNORMAL HIGH (ref 0.6–1.1)
EGFR: 48 mL/min/{1.73_m2} — AB (ref >60)
GLUCOSE: 107 mg/dL (ref 70–140)
Potassium: 4.1 mEq/L (ref 3.5–5.1)
SODIUM: 138 meq/L (ref 136–145)
TOTAL PROTEIN: 7.4 g/dL (ref 6.4–8.3)

## 2017-07-30 LAB — CBC WITH DIFFERENTIAL/PLATELET
BASO%: 0.2 % (ref 0.0–2.0)
Basophils Absolute: 0 10*3/uL (ref 0.0–0.1)
EOS ABS: 0.3 10*3/uL (ref 0.0–0.5)
EOS%: 6.2 % (ref 0.0–7.0)
HCT: 37.8 % (ref 34.8–46.6)
HGB: 13 g/dL (ref 11.6–15.9)
LYMPH%: 32.5 % (ref 14.0–49.7)
MCH: 31.1 pg (ref 25.1–34.0)
MCHC: 34.4 g/dL (ref 31.5–36.0)
MCV: 90.4 fL (ref 79.5–101.0)
MONO#: 0.7 10*3/uL (ref 0.1–0.9)
MONO%: 15.1 % — AB (ref 0.0–14.0)
NEUT%: 46 % (ref 38.4–76.8)
NEUTROS ABS: 2.2 10*3/uL (ref 1.5–6.5)
Platelets: 133 10*3/uL — ABNORMAL LOW (ref 145–400)
RBC: 4.18 10*6/uL (ref 3.70–5.45)
RDW: 15.3 % — ABNORMAL HIGH (ref 11.2–14.5)
WBC: 4.8 10*3/uL (ref 3.9–10.3)
lymph#: 1.6 10*3/uL (ref 0.9–3.3)

## 2017-07-30 LAB — UA PROTEIN, DIPSTICK - CHCC: Protein, ur: <30 mg/dL

## 2017-07-30 MED ORDER — PROCHLORPERAZINE MALEATE 10 MG PO TABS: 10.0000 mg | ORAL_TABLET | Freq: Four times a day (QID) | ORAL | 0 refills | Status: DC | PRN

## 2017-07-30 MED ORDER — SODIUM CHLORIDE 0.9 % IV SOLN
Freq: Once | INTRAVENOUS | Status: AC
  Administered 2017-07-30: 12:00:00 via INTRAVENOUS

## 2017-07-30 MED ORDER — SODIUM CHLORIDE 0.9 % IV SOLN
15.5000 mg/kg | Freq: Once | INTRAVENOUS | Status: AC
  Administered 2017-07-30: 1200 mg via INTRAVENOUS
  Filled 2017-07-30: qty 48

## 2017-07-30 MED FILL — TAGRISSO 80 MG TABLET: 80 | 30 days supply | Qty: 30 | Fill #0

## 2017-07-30 MED FILL — PROCHLORPERAZINE 10 MG TAB: 10 | 7 days supply | Qty: 30 | Fill #0

## 2017-07-30 NOTE — Telephone Encounter (Signed)
Printed avs and calender for upcoming appointment. Per 10/11 los 

## 2017-07-30 NOTE — Progress Notes (Signed)
[facility] [phone]   [phone]  OFFICE PROGRESS NOTE  Derinda Late, MD [place] [address]  DIAGNOSIS: Stage IV (T1a, N2, M1 B) non-small cell lung cancer, adenocarcinoma with positive EGFR mutation with deletion in exon 19 diagnosed initially in January 2017, with metastatic disease in May 2018 based on liver biopsy.  PRIOR THERAPY:  1) Stereotactic body radiotherapy to the left upper lobe lung nodule under the care of Dr. Pablo Ledger without biopsy completed 12/14/2015. 2) stereotactic radiotherapy to a solitary brain metastasis under the care of Dr. Lisbeth Renshaw on 04/21/2017. 3) Tagrisso 80 mg by mouth daily, first dose started 04/09/2017.  CURRENT THERAPY: Tagrisso 80 mg by mouth daily and Avastin 15 MG/KG every 3 weeks was added starting 07/09/2017 secondary to disease progression on single agent Tagrisso.  INTERVAL HISTORY: [name] 83 y.o. female returns to the clinic today for follow-up visit. The patient tolerated the first cycle of her treatment with Avastin fairly well. She also on Tagrisso 80 mg by mouth daily and tolerating this treatment fairly well. She denied having any current chest pain, shortness breath, cough or hemoptysis. She has no nausea, vomiting, diarrhea or constipation. She continues to have mild swelling of the lower extremities especially later on the day. She also has occasional dizzy spells and she is scheduled to have repeat MRI of the brain next week.The patient here today for evaluation before starting the second cycle of her treatment with Avastin.  MEDICAL HISTORY: Past Medical History:  Diagnosis Date  . Adenocarcinoma of left lung, stage 4 ([facility]) 03/18/2017  . Arthritis    Neck,shoulder  . Brain metastases ([facility]) 04/06/2017  . Colonic polyp   . Difficulty sleeping   . Encounter for antineoplastic chemotherapy 04/06/2017  . History of radiation therapy 12/04/15-12/14/15   left upper  lobe 60 Gy  . Hx of unilateral nephrectomy 1994   right   . Hyperlipemia   . Lung cancer ([facility])   . Numbness in feet   . Peripheral neuropathy   . Personal history of PE (pulmonary embolism) 2011    ALLERGIES:  is allergic to penicillins.  MEDICATIONS:  Current Outpatient Prescriptions  Medication Sig Dispense Refill  . CALCIUM PO Take 1 tablet by mouth 2 (two) times daily.    . celecoxib (CELEBREX) 200 MG capsule take 1 capsule by mouth once daily if needed for NECK PAIN OR ARTHRITIS  0  . gabapentin (NEURONTIN) 100 MG capsule 3 tablets at bedtime    . metoprolol succinate (TOPROL-XL) 25 MG 24 hr tablet Take 0.5 tablets (12.5 mg total) by mouth daily. 45 tablet 3  . Multiple Vitamin (MULTIVITAMIN WITH MINERALS) TABS Take 1 tablet by mouth daily.    . rivaroxaban (XARELTO) 20 MG TABS tablet Take 20 mg by mouth daily with supper.    Marland Kitchen TAGRISSO 80 MG tablet TAKE 1 TABLET (80 MG TOTAL) BY MOUTH DAILY. 30 tablet 0   No current facility-administered medications for this visit.     SURGICAL HISTORY:  Past Surgical History:  Procedure Laterality Date  . ABDOMINAL HYSTERECTOMY    . APPENDECTOMY    . cataracts Bilateral 09/2014   removal  . CESAREAN SECTION    . NEPHRECTOMY  1993   RT -   . TOTAL HIP ARTHROPLASTY Right 02/25/2013   Procedure: RIGHT TOTAL HIP ARTHROPLASTY ANTERIOR APPROACH;  Surgeon: Mcarthur Rossetti, MD;  Location: [facility];  Service: Orthopedics;  Laterality: Right;  REVIEW OF SYSTEMS:  A comprehensive review of systems was negative except for: Constitutional: positive for fatigue Musculoskeletal: positive for muscle weakness Neurological: positive for dizziness   PHYSICAL EXAMINATION: General appearance: alert, cooperative, fatigued and no distress Head: Normocephalic, without obvious abnormality, atraumatic Neck: no adenopathy, no JVD, supple, symmetrical, trachea midline and thyroid not enlarged, symmetric, no tenderness/mass/nodules Lymph nodes:  Cervical, supraclavicular, and axillary nodes normal. Resp: clear to auscultation bilaterally Back: symmetric, no curvature. ROM normal. No CVA tenderness. Cardio: regular rate and rhythm, S1, S2 normal, no murmur, click, rub or gallop GI: soft, non-tender; bowel sounds normal; no masses,  no organomegaly Extremities: extremities normal, atraumatic, no cyanosis or edema  ECOG PERFORMANCE STATUS: 1 - Symptomatic but completely ambulatory  Blood pressure 133/63, pulse 86, temperature 97.8 F (36.6 C), temperature source Oral, resp. rate 18, height _0  (1.702 m), weight 169 lb 1.6 oz (76.7 kg), SpO2 99 %.  LABORATORY DATA: Lab Results  Component Value Date   WBC 4.8 07/30/2017   HGB 13.0 07/30/2017   HCT 37.8 07/30/2017   MCV 90.4 07/30/2017   PLT 133 (L) 07/30/2017      Chemistry      Component Value Date/Time   NA 138 07/30/2017 0956   K 4.1 07/30/2017 0956   CL 105 02/26/2013 0553   CO2 22 07/30/2017 0956   BUN 20.1 07/30/2017 0956   CREATININE 1.2 (H) 07/30/2017 0956      Component Value Date/Time   CALCIUM 9.2 07/30/2017 0956   ALKPHOS 334 (H) 07/30/2017 0956   AST 46 (H) 07/30/2017 0956   ALT 42 07/30/2017 0956   BILITOT 0.66 07/30/2017 0956       RADIOGRAPHIC STUDIES: No results found.  ASSESSMENT AND PLAN:  This is a very pleasant 83 years old African-American female with metastatic non-small cell lung cancer that was initially diagnosed as a stage IA status post stereotactic radiotherapy of the left upper lobe and now she presented with disease recurrence as well as mediastinal lymphadenopathy and liver metastasis and solitary brain metastasis. 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 She underwent stereotactic radiotherapy to a solitary brain metastasis. She is currently on treatment with Tagrisso 80 mg by mouth daily status post 4 months. He was also started recently on Avastin 15 MG/KG every 3 weeks is status post 1 cycle  and tolerated the first cycle of her treatment well. I recommended for the patient to proceed with cycle #2 today as scheduled. I will see her back for follow-up visit in 3 weeks for reevaluation before starting cycle #3 of Avastin. For the dizzy spells, the patient is scheduled to have repeat MRI of the brain next week for evaluation of her brain metastasis. For the lower extremity swelling, I advised the patient to elevate her legs or considering compression hose. She was advised to call immediately if she has any other concerning symptoms in the interval. The patient voices understanding of current disease status and treatment options and is in agreement with the current care plan. All questions were answered. The patient knows to call the clinic with any problems, questions or concerns. We can certainly see the patient much sooner if necessary.  Disclaimer: This note was dictated with voice recognition software. Similar sounding words can inadvertently be transcribed and may not be corrected upon review.

## 2017-07-30 NOTE — Patient Instructions (Signed)
[facility] Discharge Instructions for Patients Receiving Chemotherapy  Today you received the following chemotherapy agents: Avastin.  To help prevent nausea and vomiting after your treatment, we encourage you to take your nausea medication as directed.   If you develop nausea and vomiting that is not controlled by your nausea medication, call the clinic.   BELOW ARE SYMPTOMS THAT SHOULD BE REPORTED IMMEDIATELY:  *FEVER GREATER THAN 100.5 F  *CHILLS WITH OR WITHOUT FEVER  NAUSEA AND VOMITING THAT IS NOT CONTROLLED WITH YOUR NAUSEA MEDICATION  *UNUSUAL SHORTNESS OF BREATH  *UNUSUAL BRUISING OR BLEEDING  TENDERNESS IN MOUTH AND THROAT WITH OR WITHOUT PRESENCE OF ULCERS  *URINARY PROBLEMS  *BOWEL PROBLEMS  UNUSUAL RASH Items with * indicate a potential emergency and should be followed up as soon as possible.  Feel free to call the clinic you have any questions or concerns. The clinic phone number is [phone].  Please show the [facility] at check-in to the Emergency Department and triage nurse.  Bevacizumab injection What is this medicine? BEVACIZUMAB (be va SIZ yoo mab) is a monoclonal antibody. It is used to treat many types of cancer. This medicine may be used for other purposes; ask your health care provider or pharmacist if you have questions. COMMON BRAND NAME(S): Avastin What should I tell my health care provider before I take this medicine? They need to know if you have any of these conditions: -diabetes -heart disease -high blood pressure -history of coughing up blood -prior anthracycline chemotherapy (e.g., doxorubicin, daunorubicin, epirubicin) -recent or ongoing radiation therapy -recent or planning to have surgery -stroke -an unusual or allergic reaction to bevacizumab, hamster proteins, mouse proteins, other medicines, foods, dyes, or preservatives -pregnant or trying to get pregnant -breast-feeding How should I use this  medicine? This medicine is for infusion into a vein. It is given by a health care professional in a hospital or clinic setting. Talk to your pediatrician regarding the use of this medicine in children. Special care may be needed. Overdosage: If you think you have taken too much of this medicine contact a poison control center or emergency room at once. NOTE: This medicine is only for you. Do not share this medicine with others. What if I miss a dose? It is important not to miss your dose. Call your doctor or health care professional if you are unable to keep an appointment. What may interact with this medicine? Interactions are not expected. This list may not describe all possible interactions. Give your health care provider a list of all the medicines, herbs, non-prescription drugs, or dietary supplements you use. Also tell them if you smoke, drink alcohol, or use illegal drugs. Some items may interact with your medicine. What should I watch for while using this medicine? Your condition will be monitored carefully while you are receiving this medicine. You will need important blood work and urine testing done while you are taking this medicine. This medicine may increase your risk to bruise or bleed. Call your doctor or health care professional if you notice any unusual bleeding. This medicine should be started at least 28 days following major surgery and the site of the surgery should be totally healed. Check with your doctor before scheduling dental work or surgery while you are receiving this treatment. Talk to your doctor if you have recently had surgery or if you have a wound that has not healed. Do not become pregnant while taking this medicine or for 6 months after  stopping it. Women should inform their doctor if they wish to become pregnant or think they might be pregnant. There is a potential for serious side effects to an unborn child. Talk to your health care professional or pharmacist for  more information. Do not breast-feed an infant while taking this medicine and for 6 months after the last dose. This medicine has caused ovarian failure in some women. This medicine may interfere with the ability to have a child. You should talk to your doctor or health care professional if you are concerned about your fertility. What side effects may I notice from receiving this medicine? Side effects that you should report to your doctor or health care professional as soon as possible: -allergic reactions like skin rash, itching or hives, swelling of the face, lips, or tongue -chest pain or chest tightness -chills -coughing up blood -high fever -seizures -severe constipation -signs and symptoms of bleeding such as bloody or black, tarry stools; red or dark-brown urine; spitting up blood or brown material that looks like coffee grounds; red spots on the skin; unusual bruising or bleeding from the eye, gums, or nose -signs and symptoms of a blood clot such as breathing problems; chest pain; severe, sudden headache; pain, swelling, warmth in the leg -signs and symptoms of a stroke like changes in vision; confusion; trouble speaking or understanding; severe headaches; sudden numbness or weakness of the face, arm or leg; trouble walking; dizziness; loss of balance or coordination -stomach pain -sweating -swelling of legs or ankles -vomiting -weight gain Side effects that usually do not require medical attention (report to your doctor or health care professional if they continue or are bothersome): -back pain -changes in taste -decreased appetite -dry skin -nausea -tiredness This list may not describe all possible side effects. Call your doctor for medical advice about side effects. You may report side effects to FDA at 1-800-FDA-1088. Where should I keep my medicine? This drug is given in a hospital or clinic and will not be stored at home. NOTE: This sheet is a summary. It may not cover  all possible information. If you have questions about this medicine, talk to your doctor, pharmacist, or health care provider.  2018 Elsevier/Gold Standard (2016-10-03 14:33:29)

## 2017-08-06 ENCOUNTER — Ambulatory Visit
Admission: RE | Admit: 2017-08-06 | Discharge: 2017-08-06 | Disposition: A | Discharge status: Home / Self Care | Payer: Medicare Other | Source: Ambulatory Visit | Attending: Radiation Oncology | Admitting: Radiation Oncology

## 2017-08-06 DIAGNOSIS — C7931 Secondary malignant neoplasm of brain: Secondary | ICD-10-CM

## 2017-08-06 MED ORDER — GADOBENATE DIMEGLUMINE 529 MG/ML IV SOLN
6.0000 mL | Freq: Once | INTRAVENOUS | Status: AC | PRN
  Administered 2017-08-06: 6 mL via INTRAVENOUS

## 2017-08-06 NOTE — Progress Notes (Addendum)
[name] recurrent metastatic Stage IA NSCLC, adenocarcinoma of the left upper lobe with disease in the liver and brain completed radiation 04-21-17, review 08-06-17 MRI brain, FU.  Headache:No Pain:No Dizziness:Repprts dizziness at times and a feeling of being off balanced at times. Nausea/vomiting:No Ringing in ears: Visual changes (Blurred/ diplopia double vision,blind spots, and peripheral vsion changes):No Fatigue:Having fatigue. Cognitive changes:Alert and oriented x 3.  A fluent speech. Respiratory issues:No respiratory issues. Imaging: 08-06-17 MRI brain Wt Readings from Last 3 Encounters:  08/10/17 166 lb (75.3 kg)  07/30/17 169 lb 1.6 oz (76.7 kg)  06/30/17 169 lb 8 oz (76.9 kg)  BP (!) 146/80   Pulse 91   Temp (!) 97.5 F (36.4 C) (Oral)   Resp 18   Ht 5\' 7"  (1.702 m)   Wt 166 lb (75.3 kg)   SpO2 98%   BMI 26.00 kg/m

## 2017-08-10 ENCOUNTER — Encounter: Payer: Self-pay | Admitting: Radiation Oncology

## 2017-08-10 ENCOUNTER — Ambulatory Visit
Admission: RE | Admit: 2017-08-10 | Discharge: 2017-08-10 | Disposition: A | Discharge status: Home / Self Care | Payer: Medicare Other | Source: Ambulatory Visit | Attending: Radiation Oncology | Admitting: Radiation Oncology

## 2017-08-10 VITALS — BP 146/80 | HR 91 | Temp 97.5°F | Resp 18 | Ht 67.0 in | Wt 166.0 lb

## 2017-08-10 DIAGNOSIS — C7931 Secondary malignant neoplasm of brain: Secondary | ICD-10-CM

## 2017-08-10 DIAGNOSIS — C3412 Malignant neoplasm of upper lobe, left bronchus or lung: Secondary | ICD-10-CM

## 2017-08-10 NOTE — Progress Notes (Addendum)
Radiation Oncology         [phone] ________________________________  Name: [name] MRN: [id number]  Date: 08/10/2017  DOB: 19-Mar-1928  Post Treatment Follow Up Note  CC: Derinda Late, MD  Curt Bears, MD  Diagnosis:   Recurrent metastatic Stage IA NSCLC, adenocarcinoma of the left upper lobe with disease in the liver and brain.     Interval Since Last Radiation: 5 weeks   04/21/17 SRS Treatment: 1. PTV1 left superior frontal gyrus received 20 Gy in 1 fraction  12/04/15-2/24/17SBRT Treatment:  60 Gy in 5 fractions to a lesion in the left upper lobe  Narrative:  The patient returns today for routine follow-up. The patient has a history of Stage IA NSCLC, of the left upper lobe treated with SBRT in February 2017. She was found to have recurrent disease in the liver in the spring of 2018a nd a staging scan revealed a 2-3 mm focus of enhancement concerning for disease in a left superior frontal gyrus. She completed SRS in July 2018, and comes today for follow up since treatment. She has been on Tagrisso and Avastin systemically with Dr. Julien Nordmann and has been tolerating this well.                        On review of systems, the patient states she's doing great overall. She denies any headaches, visual, auditory, speech, or balance dysfunction. She complains of right knee pain and is curious about taking celebrex. No other complaints are noted.  Past Medical History:  Past Medical History:  Diagnosis Date  . Adenocarcinoma of left lung, stage 4 ([place]) 03/18/2017  . Arthritis    Neck,shoulder  . Brain metastases ([place]) 04/06/2017  . Colonic polyp   . Difficulty sleeping   . Encounter for antineoplastic chemotherapy 04/06/2017  . History of radiation therapy 12/04/15-12/14/15   left upper lobe 60 Gy  . Hx of unilateral nephrectomy 1994   right   . Hyperlipemia   . Lung cancer ([place])   . Numbness in feet   . Peripheral neuropathy   . Personal history of PE (pulmonary  embolism) 2011    Past Surgical History: Past Surgical History:  Procedure Laterality Date  . ABDOMINAL HYSTERECTOMY    . APPENDECTOMY    . cataracts Bilateral 09/2014   removal  . CESAREAN SECTION    . NEPHRECTOMY  1993   RT -   . TOTAL HIP ARTHROPLASTY Right 02/25/2013   Procedure: RIGHT TOTAL HIP ARTHROPLASTY ANTERIOR APPROACH;  Surgeon: Mcarthur Rossetti, MD;  Location: [facility];  Service: Orthopedics;  Laterality: Right;    Social History:  Social History   Social History  . Marital status: Widowed    Spouse name: N/A  . Number of children: 2  . Years of education: masters   Occupational History  . 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 Smoking status: Former Smoker    Packs/day: 0.25    Years: 20.00    Types: Cigarettes    Quit date: 10/20/1989  . Smokeless tobacco: Never Used  . Alcohol use No  . Drug use: No  . Sexual activity: Not on file   Other Topics Concern  . Not on file   Social History Narrative   Patient lives at home alone . - Widow.   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   Caffeine- One cup daily.  Family History: Family History  Problem Relation Age of Onset  . Hypertension Father   . Diabetes Father   . Hypertension Mother   . Colon cancer Sister      ALLERGIES:  is allergic to penicillins.  Meds: Current Outpatient Prescriptions  Medication Sig Dispense Refill  . CALCIUM PO Take 1 tablet by mouth 2 (two) times daily.    Marland Kitchen gabapentin (NEURONTIN) 100 MG capsule 3 tablets at bedtime    . metoprolol succinate (TOPROL-XL) 25 MG 24 hr tablet Take 0.5 tablets (12.5 mg total) by mouth daily. 45 tablet 3  . Multiple Vitamin (MULTIVITAMIN WITH MINERALS) TABS Take 1 tablet by mouth daily.    . rivaroxaban (XARELTO) 20 MG TABS tablet Take 20 mg by mouth daily with supper.    Marland Kitchen TAGRISSO 80 MG tablet TAKE 1 TABLET (80 MG TOTAL) BY MOUTH DAILY. 30 tablet 0  . celecoxib (CELEBREX) 200 MG capsule take 1  capsule by mouth once daily if needed for NECK PAIN OR ARTHRITIS  0  . prochlorperazine (COMPAZINE) 10 MG tablet Take 1 tablet (10 mg total) by mouth every 6 (six) hours as needed for nausea or vomiting. (Patient not taking: Reported on 08/10/2017) 30 tablet 0   No current facility-administered medications for this encounter.     Physical Findings:  height is 5\' 7"  (1.702 m) and weight is 166 lb (75.3 kg). Her oral temperature is 97.5 F (36.4 C) (abnormal). Her blood pressure is 146/80 (abnormal) and her pulse is 91. Her respiration is 18 and oxygen saturation is 98%.  Pain Assessment Pain Score: 0-No pain/10 In general this is a well appearing African American female in no acute distress. She's alert and oriented x4 and appropriate throughout the examination. Cardiopulmonary assessment is negative for acute distress and she exhibits normal effort. No focal neurologic changes are noted grossly.  Lab Findings: Lab Results  Component Value Date   WBC 4.8 07/30/2017   HGB 13.0 07/30/2017   HCT 37.8 07/30/2017   MCV 90.4 07/30/2017   PLT 133 (L) 07/30/2017     Radiographic Findings: Mr Jeri Cos ZO Contrast  Result Date: 08/06/2017 CLINICAL DATA:  83 year old female stage IV lung cancer. Status post SRS treatment of a solitary left superior frontal gyrus lesion in July 2018. Restaging. EXAM: MRI HEAD WITHOUT AND WITH CONTRAST TECHNIQUE: Multiplanar, multiecho pulse sequences of the brain and surrounding structures were obtained without and with intravenous contrast. CONTRAST:  51mL MULTIHANCE GADOBENATE DIMEGLUMINE 529 MG/ML IV SOLN COMPARISON:  04/14/2017 and earlier. FINDINGS: Brain: Regression of the treated small left superior frontal gyrus lesion, now only faintly visible on series 10, image 125. No other abnormal intracranial enhancement.  No dural thickening. Elsewhere stable and normal for age gray and white matter signal. No restricted diffusion to suggest acute infarction. No midline  shift, mass effect, ventriculomegaly, extra-axial collection or acute intracranial hemorrhage. Cervicomedullary junction and pituitary are within normal limits. No chronic cerebral blood products. Vascular: Major intracranial vascular flow voids are stable. Skull and upper cervical spine: Normal for age visible cervical spine and spinal cord. Visualized bone marrow signal is within normal limits. Sinuses/Orbits: Stable and negative. Other: Visible internal auditory structures appear normal. Mastoids remain clear. Negative scalp soft tissues. IMPRESSION: 1. Regression of the solitary small treated left superior frontal gyrus metastasis, now only faintly visible on series 10, image 125. 2. No new metastatic disease or acute intracranial abnormality. Electronically Signed   By: Genevie Ann M.D.   On: 08/06/2017  14:43    Impression/Plan: 1. Recurrent metastatic Stage IA NSCLC, adenocarcinoma of the left upper lobe with disease in the liver and brain.  The patient's scan was reviewed and she appears to be radiographically doing well. She will continue in our brain oncology conferences and we recommend repeating her next MRI in 3 months. She will continue Tagrisso and Avastin with Dr. Julien Nordmann as well.        Carola Rhine, PAC

## 2017-08-13 ENCOUNTER — Telehealth: Payer: Self-pay | Admitting: Medical Oncology

## 2017-08-13 NOTE — Telephone Encounter (Signed)
returned call .Pt asking if she can get a flu vaccine? And can she take celebrex for her "arthritis in her neck and back adn knees"?

## 2017-08-16 NOTE — Telephone Encounter (Signed)
Yes for both.

## 2017-08-17 ENCOUNTER — Other Ambulatory Visit: Payer: Self-pay | Admitting: Medical Oncology

## 2017-08-17 DIAGNOSIS — Z23 Encounter for immunization: Secondary | ICD-10-CM

## 2017-08-17 NOTE — Telephone Encounter (Signed)
Pt.notified

## 2017-08-20 ENCOUNTER — Ambulatory Visit (HOSPITAL_BASED_OUTPATIENT_CLINIC_OR_DEPARTMENT_OTHER): Payer: Medicare Other

## 2017-08-20 ENCOUNTER — Encounter: Payer: Self-pay | Admitting: Internal Medicine

## 2017-08-20 ENCOUNTER — Ambulatory Visit (HOSPITAL_BASED_OUTPATIENT_CLINIC_OR_DEPARTMENT_OTHER): Payer: Medicare Other | Admitting: Internal Medicine

## 2017-08-20 ENCOUNTER — Other Ambulatory Visit (HOSPITAL_BASED_OUTPATIENT_CLINIC_OR_DEPARTMENT_OTHER): Payer: Medicare Other

## 2017-08-20 VITALS — BP 116/71 | HR 69

## 2017-08-20 VITALS — BP 128/70 | HR 91 | Temp 97.0°F | Resp 20 | Ht 67.0 in | Wt 168.0 lb

## 2017-08-20 DIAGNOSIS — C3492 Malignant neoplasm of unspecified part of left bronchus or lung: Secondary | ICD-10-CM

## 2017-08-20 DIAGNOSIS — C3412 Malignant neoplasm of upper lobe, left bronchus or lung: Secondary | ICD-10-CM

## 2017-08-20 DIAGNOSIS — C7931 Secondary malignant neoplasm of brain: Secondary | ICD-10-CM

## 2017-08-20 DIAGNOSIS — C3490 Malignant neoplasm of unspecified part of unspecified bronchus or lung: Secondary | ICD-10-CM

## 2017-08-20 DIAGNOSIS — Z23 Encounter for immunization: Secondary | ICD-10-CM | POA: Diagnosis not present

## 2017-08-20 DIAGNOSIS — Z5111 Encounter for antineoplastic chemotherapy: Secondary | ICD-10-CM

## 2017-08-20 DIAGNOSIS — Z5112 Encounter for antineoplastic immunotherapy: Secondary | ICD-10-CM

## 2017-08-20 LAB — CBC WITH DIFFERENTIAL/PLATELET
BASO%: 0.5 % (ref 0.0–2.0)
BASOS ABS: 0 10*3/uL (ref 0.0–0.1)
EOS ABS: 0.3 10*3/uL (ref 0.0–0.5)
EOS%: 6.9 % (ref 0.0–7.0)
HCT: 39.2 % (ref 34.8–46.6)
HGB: 13.2 g/dL (ref 11.6–15.9)
LYMPH%: 39.5 % (ref 14.0–49.7)
MCH: 30.6 pg (ref 25.1–34.0)
MCHC: 33.7 g/dL (ref 31.5–36.0)
MCV: 90.7 fL (ref 79.5–101.0)
MONO#: 0.6 10*3/uL (ref 0.1–0.9)
MONO%: 13.5 % (ref 0.0–14.0)
NEUT%: 39.6 % (ref 38.4–76.8)
NEUTROS ABS: 1.7 10*3/uL (ref 1.5–6.5)
PLATELETS: 130 10*3/uL — AB (ref 145–400)
RBC: 4.32 10*6/uL (ref 3.70–5.45)
RDW: 15.1 % — ABNORMAL HIGH (ref 11.2–14.5)
WBC: 4.2 10*3/uL (ref 3.9–10.3)
lymph#: 1.7 10*3/uL (ref 0.9–3.3)

## 2017-08-20 LAB — COMPREHENSIVE METABOLIC PANEL
ALT: 43 U/L (ref 0–55)
ANION GAP: 8 meq/L (ref 3–11)
AST: 52 U/L — ABNORMAL HIGH (ref 5–34)
Albumin: 3.3 g/dL — ABNORMAL LOW (ref 3.5–5.0)
Alkaline Phosphatase: 349 U/L — ABNORMAL HIGH (ref 40–150)
BILIRUBIN TOTAL: 0.67 mg/dL (ref 0.20–1.20)
BUN: 17.5 mg/dL (ref 7.0–26.0)
CO2: 23 meq/L (ref 22–29)
Calcium: 9.3 mg/dL (ref 8.4–10.4)
Chloride: 108 mEq/L (ref 98–109)
Creatinine: 1.2 mg/dL — ABNORMAL HIGH (ref 0.6–1.1)
EGFR: 49 mL/min/{1.73_m2} — AB (ref >60)
Glucose: 97 mg/dl (ref 70–140)
POTASSIUM: 4.1 meq/L (ref 3.5–5.1)
Sodium: 139 mEq/L (ref 136–145)
TOTAL PROTEIN: 7.3 g/dL (ref 6.4–8.3)

## 2017-08-20 LAB — UA PROTEIN, DIPSTICK - CHCC: Protein, ur: NEGATIVE mg/dL

## 2017-08-20 MED ORDER — SODIUM CHLORIDE 0.9 % IV SOLN
Freq: Once | INTRAVENOUS | Status: AC
  Administered 2017-08-20: 11:00:00 via INTRAVENOUS

## 2017-08-20 MED ORDER — INFLUENZA VAC SPLIT HIGH-DOSE 0.5 ML IM SUSY
0.5000 mL | PREFILLED_SYRINGE | INTRAMUSCULAR | Status: AC
  Administered 2017-08-20: 0.5 mL via INTRAMUSCULAR
  Filled 2017-08-20: qty 0.5

## 2017-08-20 MED ORDER — SODIUM CHLORIDE 0.9 % IV SOLN
15.5000 mg/kg | Freq: Once | INTRAVENOUS | Status: AC
  Administered 2017-08-20: 1200 mg via INTRAVENOUS
  Filled 2017-08-20: qty 48

## 2017-08-20 NOTE — Patient Instructions (Signed)
[facility] Discharge Instructions for Patients Receiving Chemotherapy  Today you received the following chemotherapy agents: Avastin.  To help prevent nausea and vomiting after your treatment, we encourage you to take your nausea medication as directed.   If you develop nausea and vomiting that is not controlled by your nausea medication, call the clinic.   BELOW ARE SYMPTOMS THAT SHOULD BE REPORTED IMMEDIATELY:  *FEVER GREATER THAN 100.5 F  *CHILLS WITH OR WITHOUT FEVER  NAUSEA AND VOMITING THAT IS NOT CONTROLLED WITH YOUR NAUSEA MEDICATION  *UNUSUAL SHORTNESS OF BREATH  *UNUSUAL BRUISING OR BLEEDING  TENDERNESS IN MOUTH AND THROAT WITH OR WITHOUT PRESENCE OF ULCERS  *URINARY PROBLEMS  *BOWEL PROBLEMS  UNUSUAL RASH Items with * indicate a potential emergency and should be followed up as soon as possible.  Feel free to call the clinic you have any questions or concerns. The clinic phone number is [phone].  Please show the [facility] at check-in to the Emergency Department and triage nurse.   Preventing Influenza, Adult Influenza, more commonly known as "the flu," is a viral infection that mainly affects the respiratory tract. The respiratory tract includes structures that help you breathe, such as the lungs, nose, and throat. The flu causes many common cold symptoms, as well as a high fever and body aches. The flu spreads easily from person to person (is contagious). The flu is most common from December through March. This is called flu season.You can catch the flu virus by:  Breathing in droplets from an infected person's cough or sneeze.  Touching something that was recently contaminated with the virus and then touching your mouth, nose, or eyes.  What can I do to lower my risk? You can decrease your risk of getting the flu by:  Getting a flu shot (influenza vaccination) every year. This is the best way to prevent the flu. A flu shot is  recommended for everyone age 49 months and older. ? It is best to get a flu shot in the fall, as soon as it is available. Getting a flu shot during winter or spring instead is still a good idea. Flu season can last into early spring. ? Preventing the flu through vaccination requires getting a new flu shot every year. This is because the flu virus changes slightly (mutates) from one year to the next. Even if a flu shot does not completely protect you from all flu virus mutations, it can reduce the severity of your illness and prevent dangerous complications of the flu. ? If you are pregnant, you can and should get a flu shot. ? If you have had a reaction to the shot in the past or if you are allergic to eggs, check with your health care provider before getting a flu shot. ? Sometimes the vaccine is available as a nasal spray. In some years, the nasal spray has not been as effective against the flu virus. Check with your health care provider if you have questions about this.  Practicing good health habits. This is especially important during flu season. ? Avoid contact with people who are sick with flu or cold symptoms. ? Wash your hands with soap and water often. If soap and water are not available, use hand sanitizer. ? Avoid touching your hands to your face, especially when you have not washed your hands recently. ? Use a disinfectant to clean surfaces at home and at work that may be contaminated with the flu virus. ? Keep  your body's disease-fighting system (immune system) in good shape by eating a healthy diet, drinking plenty of fluids, getting enough sleep, and exercising regularly.  If you do get the flu, avoid spreading it to others by:  Staying home until your symptoms have been gone for at least one day.  Covering your mouth and nose with your elbow when you cough or sneeze.  Avoiding close contact with others, especially babies and elderly people.  Why are these changes  important? Getting a flu shot and practicing good health habits protects you as well as other people. If you get the flu, your friends, family, and co-workers are also at risk of getting it, because it spreads so easily to others. Each year, about 2 out of every 10 people get the flu. Having the flu can lead to complications, such as pneumonia, ear infection, and sinus infection. The flu also can be deadly, especially for babies, people older than age 44, and people who have serious long-term diseases. How is this treated? Most people recover from the flu by resting at home and drinking plenty of fluids. However, a prescription antiviral medicine may reduce your flu symptoms and may make your flu go away sooner. This medicine must be started within a few days of getting flu symptoms. You can talk with your health care provider about whether you need an antiviral medicine. Antiviral medicine may be prescribed for people who are at risk for more serious flu symptoms. This includes people who:  Are older than age 88.  Are pregnant.  Have a condition that makes the flu worse or more dangerous.  Where to find more information:  Centers for Disease Control and Prevention: http://www.smith-bell.org/  [facility].com.ee: azureicus.com  American Academy of Family Physicians: familydoctor.org/familydoctor/en/kids/vaccines/preventing-the-flu.html Contact a health care provider if:  You have influenza and you develop new symptoms.  You have: ? Chest pain. ? Diarrhea. ? A fever.  Your cough gets worse, or you produce more mucus. Summary  The best way to prevent the flu is to get a flu shot every year in the fall.  Even if you get the flu after you have received the yearly vaccine, your flu may be milder and go away sooner because of your flu shot.  If you get the flu, antiviral medicines that are started with a few days of symptoms may reduce your flu symptoms and may make your flu go  away sooner.  You can also help prevent the flu by practicing good health habits. This information is not intended to replace advice given to you by your health care provider. Make sure you discuss any questions you have with your health care provider. Document Released: 10/21/2015 Document Revised: 06/14/2016 Document Reviewed: 06/14/2016 Elsevier Interactive Patient Education  [employer].

## 2017-08-20 NOTE — Progress Notes (Signed)
[facility] [phone]   [phone]  OFFICE PROGRESS NOTE  Derinda Late, MD [place] [id number]  DIAGNOSIS: Stage IV (T1a, N2, M1b) non-small cell lung cancer, adenocarcinoma with positive EGFR mutation with deletion in exon 19 diagnosed initially in January 2017, with metastatic disease in May 2018 based on liver biopsy.  PRIOR THERAPY:  1) Stereotactic body radiotherapy to the left upper lobe lung nodule under the care of Dr. Pablo Ledger without biopsy completed 12/14/2015. 2) stereotactic radiotherapy to a solitary brain metastasis under the care of Dr. Lisbeth Renshaw on 04/21/2017. 3) Tagrisso 80 mg by mouth daily, first dose started 04/09/2017.  CURRENT THERAPY: Tagrisso 80 mg by mouth daily and Avastin 15 MG/KG every 3 weeks was added starting 07/09/2017 secondary to disease progression on single agent Tagrisso.  Status post 2 cycles with Avastin.  INTERVAL HISTORY: [name] 83 y.o. female returns to the clinic today for follow-up visit accompanied by her son.  The patient is feeling fine today with no specific complaints except for arthritis and she is currently on Celebrex on as needed basis.  She denied having any chest pain, shortness breath, cough or hemoptysis.  She denied having any fever or chills.  She has no nausea, vomiting, diarrhea or constipation.  She continues to complain of mild fatigue.  She is tolerating her treatment with Tagrisso and the Avastin fairly well.  She is here today for evaluation before starting cycle #3 of Avastin.   MEDICAL HISTORY: Past Medical History:  Diagnosis Date  . Adenocarcinoma of left lung, stage 4 ([place]) 03/18/2017  . Arthritis    Neck,shoulder  . Brain metastases ([place]) 04/06/2017  . Colonic polyp   . Difficulty sleeping   . Encounter for antineoplastic chemotherapy 04/06/2017  . History of radiation therapy 12/04/15-12/14/15   left upper lobe 60 Gy  . Hx of unilateral  nephrectomy 1994   right   . Hyperlipemia   . Lung cancer ([place])   . Numbness in feet   . Peripheral neuropathy   . Personal history of PE (pulmonary embolism) 2011    ALLERGIES:  is allergic to penicillins.  MEDICATIONS:  Current Outpatient Prescriptions  Medication Sig Dispense Refill  . CALCIUM PO Take 1 tablet by mouth 2 (two) times daily.    . celecoxib (CELEBREX) 200 MG capsule take 1 capsule by mouth once daily if needed for NECK PAIN OR ARTHRITIS  0  . gabapentin (NEURONTIN) 100 MG capsule 3 tablets at bedtime    . metoprolol succinate (TOPROL-XL) 25 MG 24 hr tablet Take 0.5 tablets (12.5 mg total) by mouth daily. 45 tablet 3  . Multiple Vitamin (MULTIVITAMIN WITH MINERALS) TABS Take 1 tablet by mouth daily.    . prochlorperazine (COMPAZINE) 10 MG tablet Take 1 tablet (10 mg total) by mouth every 6 (six) hours as needed for nausea or vomiting. (Patient not taking: Reported on 08/10/2017) 30 tablet 0  . rivaroxaban (XARELTO) 20 MG TABS tablet Take 20 mg by mouth daily with supper.    Marland Kitchen TAGRISSO 80 MG tablet TAKE 1 TABLET (80 MG TOTAL) BY MOUTH DAILY. 30 tablet 0   No current facility-administered medications for this visit.     SURGICAL HISTORY:  Past Surgical History:  Procedure Laterality Date  . ABDOMINAL HYSTERECTOMY    . APPENDECTOMY    . cataracts Bilateral 09/2014   removal  . CESAREAN SECTION    . NEPHRECTOMY  1993  RT -   . TOTAL HIP ARTHROPLASTY Right 02/25/2013   Procedure: RIGHT TOTAL HIP ARTHROPLASTY ANTERIOR APPROACH;  Surgeon: Mcarthur Rossetti, MD;  Location: [facility];  Service: Orthopedics;  Laterality: Right;    REVIEW OF SYSTEMS:  A comprehensive review of systems was negative except for: Constitutional: positive for fatigue Musculoskeletal: positive for arthralgias   PHYSICAL EXAMINATION: General appearance: alert, cooperative, fatigued and no distress Head: Normocephalic, without obvious abnormality, atraumatic Neck: no adenopathy, no JVD,  supple, symmetrical, trachea midline and thyroid not enlarged, symmetric, no tenderness/mass/nodules Lymph nodes: Cervical, supraclavicular, and axillary nodes normal. Resp: clear to auscultation bilaterally Back: symmetric, no curvature. ROM normal. No CVA tenderness. Cardio: regular rate and rhythm, S1, S2 normal, no murmur, click, rub or gallop GI: soft, non-tender; bowel sounds normal; no masses,  no organomegaly Extremities: extremities normal, atraumatic, no cyanosis or edema  ECOG PERFORMANCE STATUS: 1 - Symptomatic but completely ambulatory  Blood pressure 128/70, pulse 91, temperature (!) 97 F (36.1 C), temperature source Oral, resp. rate 20, height '5\' 7"'  (1.702 m), weight 168 lb (76.2 kg), SpO2 99 %.  LABORATORY DATA: Lab Results  Component Value Date   WBC 4.8 07/30/2017   HGB 13.0 07/30/2017   HCT 37.8 07/30/2017   MCV 90.4 07/30/2017   PLT 133 (L) 07/30/2017      Chemistry      Component Value Date/Time   NA 138 07/30/2017 0956   K 4.1 07/30/2017 0956   CL 105 02/26/2013 0553   CO2 22 07/30/2017 0956   BUN 20.1 07/30/2017 0956   CREATININE 1.2 (H) 07/30/2017 0956      Component Value Date/Time   CALCIUM 9.2 07/30/2017 0956   ALKPHOS 334 (H) 07/30/2017 0956   AST 46 (H) 07/30/2017 0956   ALT 42 07/30/2017 0956   BILITOT 0.66 07/30/2017 0956       RADIOGRAPHIC STUDIES: Mr Jeri Cos GY Contrast  Result Date: 08/06/2017 CLINICAL DATA:  83 year old female stage IV lung cancer. Status post SRS treatment of a solitary left superior frontal gyrus lesion in July 2018. Restaging. EXAM: MRI HEAD WITHOUT AND WITH CONTRAST TECHNIQUE: Multiplanar, multiecho pulse sequences of the brain and surrounding structures were obtained without and with intravenous contrast. CONTRAST:  57m MULTIHANCE GADOBENATE DIMEGLUMINE 529 MG/ML IV SOLN COMPARISON:  04/14/2017 and earlier. FINDINGS: Brain: Regression of the treated small left superior frontal gyrus lesion, now only faintly  visible on series 10, image 125. No other abnormal intracranial enhancement.  No dural thickening. Elsewhere stable and normal for age gray and white matter signal. No restricted diffusion to suggest acute infarction. No midline shift, mass effect, ventriculomegaly, extra-axial collection or acute intracranial hemorrhage. Cervicomedullary junction and pituitary are within normal limits. No chronic cerebral blood products. Vascular: Major intracranial vascular flow voids are stable. Skull and upper cervical spine: Normal for age visible cervical spine and spinal cord. Visualized bone marrow signal is within normal limits. Sinuses/Orbits: Stable and negative. Other: Visible internal auditory structures appear normal. Mastoids remain clear. Negative scalp soft tissues. IMPRESSION: 1. Regression of the solitary small treated left superior frontal gyrus metastasis, now only faintly visible on series 10, image 125. 2. No new metastatic disease or acute intracranial abnormality. Electronically Signed   By: HGenevie AnnM.D.   On: 08/06/2017 14:43    ASSESSMENT AND PLAN:  This is a very pleasant 83years old African-American female with metastatic non-small cell lung cancer that was initially diagnosed as a stage IA status post stereotactic radiotherapy  of the left upper lobe and now she presented with disease recurrence as well as mediastinal lymphadenopathy and liver metastasis and solitary brain metastasis. 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 She underwent stereotactic radiotherapy to a solitary brain metastasis. She is currently on treatment with Tagrisso 80 mg by mouth daily status post 4 months. He was also started recently on Avastin 15 MG/KG every 3 weeks is status post 2 cycles. She continues to tolerate this treatment fairly well. I recommended for the patient to proceed with cycle #3 of Avastin today as a schedule.  She will also continue on Tagrisso with the same  dose. I will see her back for follow-up visit in 3 weeks for evaluation after repeating CT scan of the chest, abdomen and pelvis for restaging of her disease. The patient was advised to call immediately if she has any concerning symptoms in the interval. The patient voices understanding of current disease status and treatment options and is in agreement with the current care plan. All questions were answered. The patient knows to call the clinic with any problems, questions or concerns. We can certainly see the patient much sooner if necessary.  Disclaimer: This note was dictated with voice recognition software. Similar sounding words can inadvertently be transcribed and may not be corrected upon review.

## 2017-08-24 ENCOUNTER — Telehealth: Payer: Self-pay | Admitting: Internal Medicine

## 2017-08-24 NOTE — Telephone Encounter (Signed)
Per 11/1 los - all previous appointments had been scheduled.

## 2017-08-27 ENCOUNTER — Other Ambulatory Visit: Payer: Self-pay | Admitting: Internal Medicine

## 2017-08-27 DIAGNOSIS — Z5111 Encounter for antineoplastic chemotherapy: Secondary | ICD-10-CM

## 2017-08-27 DIAGNOSIS — C3492 Malignant neoplasm of unspecified part of left bronchus or lung: Secondary | ICD-10-CM

## 2017-08-27 DIAGNOSIS — C7931 Secondary malignant neoplasm of brain: Secondary | ICD-10-CM

## 2017-08-27 DIAGNOSIS — Z7189 Other specified counseling: Secondary | ICD-10-CM

## 2017-08-28 MED FILL — TAGRISSO 80 MG TABLET: 80 | 30 days supply | Qty: 30 | Fill #0

## 2017-09-02 ENCOUNTER — Other Ambulatory Visit: Payer: Self-pay | Admitting: Internal Medicine

## 2017-09-02 ENCOUNTER — Telehealth: Payer: Self-pay | Admitting: Medical Oncology

## 2017-09-02 ENCOUNTER — Telehealth: Payer: Self-pay | Admitting: Internal Medicine

## 2017-09-02 NOTE — Telephone Encounter (Signed)
Pt wants tx moved from 11/21 to 11/23. tx puts her in bed next day . She wants to  enjoy thanksgiving. Schedule request sent.

## 2017-09-02 NOTE — Telephone Encounter (Signed)
Done.  Please send scheduling message.

## 2017-09-02 NOTE — Telephone Encounter (Signed)
Spoke with patient regarding changes in appts per 11/14 sch msg.

## 2017-09-07 ENCOUNTER — Encounter (HOSPITAL_COMMUNITY): Payer: Self-pay

## 2017-09-07 ENCOUNTER — Ambulatory Visit (HOSPITAL_COMMUNITY)
Admission: RE | Admit: 2017-09-07 | Discharge: 2017-09-07 | Disposition: A | Discharge status: Home / Self Care | Payer: Medicare Other | Source: Ambulatory Visit | Attending: Internal Medicine | Admitting: Internal Medicine

## 2017-09-07 DIAGNOSIS — C787 Secondary malignant neoplasm of liver and intrahepatic bile duct: Secondary | ICD-10-CM | POA: Insufficient documentation

## 2017-09-07 DIAGNOSIS — C3490 Malignant neoplasm of unspecified part of unspecified bronchus or lung: Secondary | ICD-10-CM

## 2017-09-07 DIAGNOSIS — Z5111 Encounter for antineoplastic chemotherapy: Secondary | ICD-10-CM | POA: Diagnosis present

## 2017-09-07 DIAGNOSIS — Z905 Acquired absence of kidney: Secondary | ICD-10-CM | POA: Diagnosis not present

## 2017-09-07 DIAGNOSIS — I700 Atherosclerosis of aorta: Secondary | ICD-10-CM | POA: Insufficient documentation

## 2017-09-07 DIAGNOSIS — C3492 Malignant neoplasm of unspecified part of left bronchus or lung: Secondary | ICD-10-CM | POA: Insufficient documentation

## 2017-09-07 MED ORDER — IOPAMIDOL (ISOVUE-300) INJECTION 61%
INTRAVENOUS | Status: AC
  Administered 2017-09-07: 100 mL via INTRAVENOUS
  Filled 2017-09-07: qty 100

## 2017-09-07 MED ORDER — IOPAMIDOL (ISOVUE-300) INJECTION 61%
100.0000 mL | Freq: Once | INTRAVENOUS | Status: AC | PRN
  Administered 2017-09-07: 100 mL via INTRAVENOUS

## 2017-09-09 ENCOUNTER — Ambulatory Visit (HOSPITAL_BASED_OUTPATIENT_CLINIC_OR_DEPARTMENT_OTHER): Payer: Medicare Other | Admitting: Internal Medicine

## 2017-09-09 ENCOUNTER — Ambulatory Visit: Payer: Medicare Other | Admitting: Internal Medicine

## 2017-09-09 ENCOUNTER — Encounter: Payer: Self-pay | Admitting: Internal Medicine

## 2017-09-09 ENCOUNTER — Other Ambulatory Visit (HOSPITAL_BASED_OUTPATIENT_CLINIC_OR_DEPARTMENT_OTHER): Payer: Medicare Other

## 2017-09-09 ENCOUNTER — Other Ambulatory Visit: Payer: Medicare Other

## 2017-09-09 ENCOUNTER — Telehealth: Payer: Self-pay | Admitting: Internal Medicine

## 2017-09-09 ENCOUNTER — Ambulatory Visit: Payer: Medicare Other

## 2017-09-09 VITALS — BP 158/90 | HR 93 | Temp 97.6°F | Resp 18 | Ht 67.0 in | Wt 165.9 lb

## 2017-09-09 DIAGNOSIS — C7931 Secondary malignant neoplasm of brain: Secondary | ICD-10-CM

## 2017-09-09 DIAGNOSIS — C3492 Malignant neoplasm of unspecified part of left bronchus or lung: Secondary | ICD-10-CM

## 2017-09-09 DIAGNOSIS — C3412 Malignant neoplasm of upper lobe, left bronchus or lung: Secondary | ICD-10-CM

## 2017-09-09 DIAGNOSIS — C787 Secondary malignant neoplasm of liver and intrahepatic bile duct: Secondary | ICD-10-CM | POA: Diagnosis not present

## 2017-09-09 DIAGNOSIS — Z5111 Encounter for antineoplastic chemotherapy: Secondary | ICD-10-CM

## 2017-09-09 LAB — CBC WITH DIFFERENTIAL/PLATELET
BASO%: 0.4 % (ref 0.0–2.0)
BASOS ABS: 0 10*3/uL (ref 0.0–0.1)
EOS ABS: 0.3 10*3/uL (ref 0.0–0.5)
EOS%: 5.5 % (ref 0.0–7.0)
HEMATOCRIT: 40.5 % (ref 34.8–46.6)
HEMOGLOBIN: 13.6 g/dL (ref 11.6–15.9)
LYMPH%: 34.7 % (ref 14.0–49.7)
MCH: 30.9 pg (ref 25.1–34.0)
MCHC: 33.6 g/dL (ref 31.5–36.0)
MCV: 92 fL (ref 79.5–101.0)
MONO#: 0.6 10*3/uL (ref 0.1–0.9)
MONO%: 13.9 % (ref 0.0–14.0)
NEUT#: 2.1 10*3/uL (ref 1.5–6.5)
NEUT%: 45.5 % (ref 38.4–76.8)
Platelets: 113 10*3/uL — ABNORMAL LOW (ref 145–400)
RBC: 4.4 10*6/uL (ref 3.70–5.45)
RDW: 15.4 % — AB (ref 11.2–14.5)
WBC: 4.5 10*3/uL (ref 3.9–10.3)
lymph#: 1.6 10*3/uL (ref 0.9–3.3)

## 2017-09-09 LAB — COMPREHENSIVE METABOLIC PANEL
ALBUMIN: 3.4 g/dL — AB (ref 3.5–5.0)
ALT: 46 U/L (ref 0–55)
AST: 54 U/L — AB (ref 5–34)
Alkaline Phosphatase: 307 U/L — ABNORMAL HIGH (ref 40–150)
Anion Gap: 8 mEq/L (ref 3–11)
BUN: 13.8 mg/dL (ref 7.0–26.0)
CALCIUM: 9.5 mg/dL (ref 8.4–10.4)
CHLORIDE: 105 meq/L (ref 98–109)
CO2: 25 mEq/L (ref 22–29)
CREATININE: 1.2 mg/dL — AB (ref 0.6–1.1)
EGFR: 48 mL/min/{1.73_m2} — ABNORMAL LOW (ref >60)
GLUCOSE: 99 mg/dL (ref 70–140)
Potassium: 4.2 mEq/L (ref 3.5–5.1)
Sodium: 138 mEq/L (ref 136–145)
Total Bilirubin: 0.75 mg/dL (ref 0.20–1.20)
Total Protein: 7.7 g/dL (ref 6.4–8.3)

## 2017-09-09 LAB — UA PROTEIN, DIPSTICK - CHCC: PROTEIN: 30 mg/dL

## 2017-09-09 MED ORDER — PROCHLORPERAZINE MALEATE 10 MG PO TABS
10.0000 mg | ORAL_TABLET | Freq: Four times a day (QID) | ORAL | 0 refills | Status: DC | PRN
Start: 2017-09-09 — End: 2017-11-13

## 2017-09-09 MED FILL — PROCHLORPERAZINE 10 MG TAB: 10 | 8 days supply | Qty: 30 | Fill #0

## 2017-09-09 NOTE — Telephone Encounter (Signed)
Gave avs and calendar for December  °

## 2017-09-09 NOTE — Progress Notes (Signed)
[place] [phone]   [phone]  OFFICE PROGRESS NOTE  Derinda Late, MD [place] [id number]  DIAGNOSIS: Stage IV (T1a, N2, M1b) non-small cell lung cancer, adenocarcinoma with positive EGFR mutation with deletion in exon 19 diagnosed initially in January 2017, with metastatic disease in May 2018 based on liver biopsy.  PRIOR THERAPY:  1) Stereotactic body radiotherapy to the left upper lobe lung nodule under the care of Dr. Pablo Ledger without biopsy completed 12/14/2015. 2) stereotactic radiotherapy to a solitary brain metastasis under the care of Dr. Lisbeth Renshaw on 04/21/2017. 3) Tagrisso 80 mg by mouth daily, first dose started 04/09/2017.  CURRENT THERAPY: Tagrisso 80 mg by mouth daily and Avastin 15 MG/KG every 3 weeks was added starting 07/09/2017 secondary to disease progression on single agent Tagrisso.  Status post 3 cycles with Avastin.  Last dose was given August 20, 2017  INTERVAL HISTORY: [name] 83 y.o. female returns to the clinic today for follow-up visit accompanied by her son.  The patient continues to complain of increasing fatigue and weakness.  She denied having any current chest pain, shortness breath, cough or hemoptysis.  She denied having any fever or chills.  She has no nausea, vomiting, diarrhea or constipation.  She denied having any significant skin rash.  She denied having any weight loss or night sweats.  She is here today for evaluation after repeating CT scan of the chest, abdomen and pelvis for restaging of her disease.   MEDICAL HISTORY: Past Medical History:  Diagnosis Date  . Adenocarcinoma of left lung, stage 4 ([place]) 03/18/2017  . Arthritis    Neck,shoulder  . Brain metastases ([place]) 04/06/2017  . Colonic polyp   . Difficulty sleeping   . Encounter for antineoplastic chemotherapy 04/06/2017  . History of radiation therapy 12/04/15-12/14/15   left upper lobe 60 Gy  . Hx of  unilateral nephrectomy 1994   right   . Hyperlipemia   . Lung cancer ([place])   . Numbness in feet   . Peripheral neuropathy   . Personal history of PE (pulmonary embolism) 2011    ALLERGIES:  is allergic to penicillins.  MEDICATIONS:  Current Outpatient Medications  Medication Sig Dispense Refill  . CALCIUM PO Take 1 tablet by mouth 2 (two) times daily.    . celecoxib (CELEBREX) 200 MG capsule take 1 capsule by mouth once daily if needed for NECK PAIN OR ARTHRITIS  0  . gabapentin (NEURONTIN) 100 MG capsule 3 tablets at bedtime    . metoprolol succinate (TOPROL-XL) 25 MG 24 hr tablet Take 0.5 tablets (12.5 mg total) by mouth daily. 45 tablet 3  . Multiple Vitamin (MULTIVITAMIN WITH MINERALS) TABS Take 1 tablet by mouth daily.    . prochlorperazine (COMPAZINE) 10 MG tablet Take 1 tablet (10 mg total) by mouth every 6 (six) hours as needed for nausea or vomiting. (Patient not taking: Reported on 08/10/2017) 30 tablet 0  . rivaroxaban (XARELTO) 20 MG TABS tablet Take 20 mg by mouth daily with supper.    Marland Kitchen TAGRISSO 80 MG tablet TAKE 1 TABLET (80 MG TOTAL) BY MOUTH DAILY. 30 tablet 0   No current facility-administered medications for this visit.     SURGICAL HISTORY:  Past Surgical History:  Procedure Laterality Date  . ABDOMINAL HYSTERECTOMY    . APPENDECTOMY    . cataracts Bilateral 09/2014   removal  . CESAREAN SECTION    . NEPHRECTOMY  1993  RT -   . TOTAL HIP ARTHROPLASTY Right 02/25/2013   Procedure: RIGHT TOTAL HIP ARTHROPLASTY ANTERIOR APPROACH;  Surgeon: Mcarthur Rossetti, MD;  Location: [facility];  Service: Orthopedics;  Laterality: Right;    REVIEW OF SYSTEMS:  Constitutional: positive for fatigue Eyes: negative Ears, nose, mouth, throat, and face: negative Respiratory: negative Cardiovascular: negative Gastrointestinal: negative Genitourinary:negative Integument/breast: negative Hematologic/lymphatic: negative Musculoskeletal:negative Neurological:  negative Behavioral/Psych: negative Endocrine: negative Allergic/Immunologic: negative   PHYSICAL EXAMINATION: General appearance: alert, cooperative, fatigued and no distress Head: Normocephalic, without obvious abnormality, atraumatic Neck: no adenopathy, no JVD, supple, symmetrical, trachea midline and thyroid not enlarged, symmetric, no tenderness/mass/nodules Lymph nodes: Cervical, supraclavicular, and axillary nodes normal. Resp: clear to auscultation bilaterally Back: symmetric, no curvature. ROM normal. No CVA tenderness. Cardio: regular rate and rhythm, S1, S2 normal, no murmur, click, rub or gallop GI: soft, non-tender; bowel sounds normal; no masses,  no organomegaly Extremities: extremities normal, atraumatic, no cyanosis or edema Neurologic: Alert and oriented X 3, normal strength and tone. Normal symmetric reflexes. Normal coordination and gait  ECOG PERFORMANCE STATUS: 1 - Symptomatic but completely ambulatory  Blood pressure (!) 158/90, pulse 93, temperature 97.6 F (36.4 C), temperature source Oral, resp. rate 18, height '5\' 7"'  (1.702 m), weight 165 lb 14.4 oz (75.3 kg), SpO2 100 %.  LABORATORY DATA: Lab Results  Component Value Date   WBC 4.5 09/09/2017   HGB 13.6 09/09/2017   HCT 40.5 09/09/2017   MCV 92.0 09/09/2017   PLT 113 (L) 09/09/2017      Chemistry      Component Value Date/Time   NA 139 08/20/2017 0814   K 4.1 08/20/2017 0814   CL 105 02/26/2013 0553   CO2 23 08/20/2017 0814   BUN 17.5 08/20/2017 0814   CREATININE 1.2 (H) 08/20/2017 0814      Component Value Date/Time   CALCIUM 9.3 08/20/2017 0814   ALKPHOS 349 (H) 08/20/2017 0814   AST 52 (H) 08/20/2017 0814   ALT 43 08/20/2017 0814   BILITOT 0.67 08/20/2017 0814       RADIOGRAPHIC STUDIES: Ct Chest W Contrast  Result Date: 09/07/2017 CLINICAL DATA:  Non-small-cell lung cancer with liver metastases. EXAM: CT CHEST, ABDOMEN, AND PELVIS WITH CONTRAST TECHNIQUE: Multidetector CT imaging  of the chest, abdomen and pelvis was performed following the standard protocol during bolus administration of intravenous contrast. CONTRAST:  100 cc Isovue-300 COMPARISON:  06/26/2017 FINDINGS: CT CHEST FINDINGS Cardiovascular: The heart size is normal. No pericardial effusion. Coronary artery calcification is evident. Atherosclerotic calcification is noted in the wall of the thoracic aorta. Mediastinum/Nodes: Stable bilateral thyroid nodules. No mediastinal lymphadenopathy. The There is no hilar lymphadenopathy. The esophagus has normal imaging features. There is no axillary lymphadenopathy. Lungs/Pleura: Similar appearance anterior left apical nodule of 1.4 x 1.4 cm today. 6 x 10 mm right upper lobe ground-glass nodule is stable. Subtle ground-glass nodule anterior right upper lobe also unchanged. Chronic atelectasis or scarring at the bases. Musculoskeletal: Bone windows reveal no worrisome lytic or sclerotic osseous lesions. CT ABDOMEN PELVIS FINDINGS Hepatobiliary: Dominant right liver lesion is similar to prior measuring 6.0 x 5.2 cm today compared to 5.9 x 5.1 cm previously. 16 x 19 mm subcapsular lesion in the anterior dome of liver on the prior study is now 19 x 25 mm. 14 mm lesion medial segment left liver on today's exam was 8 mm previously. There is no evidence for gallstones, gallbladder wall thickening, or pericholecystic fluid. No intrahepatic or extrahepatic biliary dilation.  Pancreas: No focal mass lesion. No dilatation of the main duct. No intraparenchymal cyst. No peripancreatic edema. Spleen: No splenomegaly. No focal mass lesion. Adrenals/Urinary Tract: No adrenal nodule or mass. Right kidney surgically absent. Left kidney unremarkable. No evidence for hydroureter. Bladder obscured by streak artifact. Stomach/Bowel: Tiny hiatal hernia. Stomach otherwise unremarkable. Duodenum is normally positioned as is the ligament of Treitz. No small bowel wall thickening. No small bowel dilatation. The  terminal ileum is normal. The appendix is not visualized, but there is no edema or inflammation in the region of the cecum. Diverticular changes are noted in the left colon without evidence of diverticulitis. Vascular/Lymphatic: There is abdominal aortic atherosclerosis without aneurysm. There is no gastrohepatic or hepatoduodenal ligament lymphadenopathy. No intraperitoneal or retroperitoneal lymphadenopathy. No pelvic sidewall lymphadenopathy. Reproductive: Uterus surgically absent.  There is no adnexal mass. Other: No evidence for free fluid although inferior pelvis obscured by streak artifact. Musculoskeletal: Patient status post right the total hip replacement. IMPRESSION: 1. Interval slight progression of liver metastases, as above. 2. Similar appearance left apical lesion. 3. Status post right nephrectomy. 4.  Aortic Atherosclerois (ICD10-170.0) Electronically Signed   By: Misty Stanley M.D.   On: 09/07/2017 09:11   Ct Abdomen Pelvis W Contrast  Result Date: 09/07/2017 CLINICAL DATA:  Non-small-cell lung cancer with liver metastases. EXAM: CT CHEST, ABDOMEN, AND PELVIS WITH CONTRAST TECHNIQUE: Multidetector CT imaging of the chest, abdomen and pelvis was performed following the standard protocol during bolus administration of intravenous contrast. CONTRAST:  100 cc Isovue-300 COMPARISON:  06/26/2017 FINDINGS: CT CHEST FINDINGS Cardiovascular: The heart size is normal. No pericardial effusion. Coronary artery calcification is evident. Atherosclerotic calcification is noted in the wall of the thoracic aorta. Mediastinum/Nodes: Stable bilateral thyroid nodules. No mediastinal lymphadenopathy. The There is no hilar lymphadenopathy. The esophagus has normal imaging features. There is no axillary lymphadenopathy. Lungs/Pleura: Similar appearance anterior left apical nodule of 1.4 x 1.4 cm today. 6 x 10 mm right upper lobe ground-glass nodule is stable. Subtle ground-glass nodule anterior right upper lobe also  unchanged. Chronic atelectasis or scarring at the bases. Musculoskeletal: Bone windows reveal no worrisome lytic or sclerotic osseous lesions. CT ABDOMEN PELVIS FINDINGS Hepatobiliary: Dominant right liver lesion is similar to prior measuring 6.0 x 5.2 cm today compared to 5.9 x 5.1 cm previously. 16 x 19 mm subcapsular lesion in the anterior dome of liver on the prior study is now 19 x 25 mm. 14 mm lesion medial segment left liver on today's exam was 8 mm previously. There is no evidence for gallstones, gallbladder wall thickening, or pericholecystic fluid. No intrahepatic or extrahepatic biliary dilation. Pancreas: No focal mass lesion. No dilatation of the main duct. No intraparenchymal cyst. No peripancreatic edema. Spleen: No splenomegaly. No focal mass lesion. Adrenals/Urinary Tract: No adrenal nodule or mass. Right kidney surgically absent. Left kidney unremarkable. No evidence for hydroureter. Bladder obscured by streak artifact. Stomach/Bowel: Tiny hiatal hernia. Stomach otherwise unremarkable. Duodenum is normally positioned as is the ligament of Treitz. No small bowel wall thickening. No small bowel dilatation. The terminal ileum is normal. The appendix is not visualized, but there is no edema or inflammation in the region of the cecum. Diverticular changes are noted in the left colon without evidence of diverticulitis. Vascular/Lymphatic: There is abdominal aortic atherosclerosis without aneurysm. There is no gastrohepatic or hepatoduodenal ligament lymphadenopathy. No intraperitoneal or retroperitoneal lymphadenopathy. No pelvic sidewall lymphadenopathy. Reproductive: Uterus surgically absent.  There is no adnexal mass. Other: No evidence for free fluid although  inferior pelvis obscured by streak artifact. Musculoskeletal: Patient status post right the total hip replacement. IMPRESSION: 1. Interval slight progression of liver metastases, as above. 2. Similar appearance left apical lesion. 3. Status post  right nephrectomy. 4.  Aortic Atherosclerois (ICD10-170.0) Electronically Signed   By: Misty Stanley M.D.   On: 09/07/2017 09:11    ASSESSMENT AND PLAN:  This is a very pleasant 83 years old African-American female with metastatic non-small cell lung cancer that was initially diagnosed as a stage IA status post stereotactic radiotherapy of the left upper lobe and now she presented with disease recurrence as well as mediastinal lymphadenopathy and liver metastasis and solitary brain metastasis. 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 She underwent stereotactic radiotherapy to a solitary brain metastasis. She is currently on treatment with Tagrisso 80 mg by mouth daily status post 4 months. He was also started recently on Avastin 15 MG/KG every 3 weeks is status post 3 cycles. She continues to have fatigue and weakness with this treatment. She had a repeat CT scan of the chest, abdomen and pelvis performed recently.  I personally and independently reviewed the scan images and discussed the results with the patient and her son.  Unfortunately had a scan showed further progression of her disease especially in the liver. I had a lengthy discussion with the patient and her son about her treatment options including palliative care and hospice referral versus continuation of treatment with Tagrisso and consideration of ultrasound-guided core biopsy of the progressive liver lesion to check for any new mutations versus treatment with systemic chemotherapy. After the discussion the patient would like to continue on [place] for now and we will order the ultrasound-guided core biopsy of the liver lesion. We will canceled the treatment with Avastin at this point. I will see her back for follow-up visit in 1 month for evaluation with repeat blood work for more discussion of her treatment options. The patient was advised to call immediately if she has any concerning symptoms in the  interval. The patient voices understanding of current disease status and treatment options and is in agreement with the current care plan. All questions were answered. The patient knows to call the clinic with any problems, questions or concerns. We can certainly see the patient much sooner if necessary.  Disclaimer: This note was dictated with voice recognition software. Similar sounding words can inadvertently be transcribed and may not be corrected upon review.

## 2017-09-11 ENCOUNTER — Ambulatory Visit: Payer: Medicare Other

## 2017-09-11 ENCOUNTER — Other Ambulatory Visit: Payer: Self-pay | Admitting: Radiology

## 2017-09-16 ENCOUNTER — Other Ambulatory Visit: Payer: Self-pay | Admitting: Student

## 2017-09-17 ENCOUNTER — Encounter (HOSPITAL_COMMUNITY): Payer: Self-pay

## 2017-09-17 ENCOUNTER — Ambulatory Visit (HOSPITAL_COMMUNITY)
Admission: RE | Admit: 2017-09-17 | Discharge: 2017-09-17 | Disposition: A | Discharge status: Home / Self Care | Payer: Medicare Other | Source: Ambulatory Visit | Attending: Internal Medicine | Admitting: Internal Medicine

## 2017-09-17 ENCOUNTER — Other Ambulatory Visit: Payer: Self-pay | Admitting: Student

## 2017-09-17 DIAGNOSIS — E785 Hyperlipidemia, unspecified: Secondary | ICD-10-CM | POA: Insufficient documentation

## 2017-09-17 DIAGNOSIS — C7931 Secondary malignant neoplasm of brain: Secondary | ICD-10-CM | POA: Diagnosis not present

## 2017-09-17 DIAGNOSIS — C787 Secondary malignant neoplasm of liver and intrahepatic bile duct: Secondary | ICD-10-CM | POA: Diagnosis not present

## 2017-09-17 DIAGNOSIS — Z7901 Long term (current) use of anticoagulants: Secondary | ICD-10-CM | POA: Diagnosis not present

## 2017-09-17 DIAGNOSIS — Z9221 Personal history of antineoplastic chemotherapy: Secondary | ICD-10-CM | POA: Insufficient documentation

## 2017-09-17 DIAGNOSIS — C3492 Malignant neoplasm of unspecified part of left bronchus or lung: Secondary | ICD-10-CM | POA: Insufficient documentation

## 2017-09-17 DIAGNOSIS — Z880 Allergy status to penicillin: Secondary | ICD-10-CM | POA: Diagnosis not present

## 2017-09-17 DIAGNOSIS — Z923 Personal history of irradiation: Secondary | ICD-10-CM | POA: Diagnosis not present

## 2017-09-17 LAB — CBC WITH DIFFERENTIAL/PLATELET
Basophils Absolute: 0 10*3/uL (ref 0.0–0.1)
Basophils Relative: 0 %
EOS ABS: 0.2 10*3/uL (ref 0.0–0.7)
Eosinophils Relative: 5 %
HEMATOCRIT: 42 % (ref 36.0–46.0)
HEMOGLOBIN: 14.3 g/dL (ref 12.0–15.0)
LYMPHS ABS: 1.8 10*3/uL (ref 0.7–4.0)
LYMPHS PCT: 33 %
MCH: 30.9 pg (ref 26.0–34.0)
MCHC: 34 g/dL (ref 30.0–36.0)
MCV: 90.7 fL (ref 78.0–100.0)
MONOS PCT: 13 %
Monocytes Absolute: 0.7 10*3/uL (ref 0.1–1.0)
NEUTROS PCT: 49 %
Neutro Abs: 2.6 10*3/uL (ref 1.7–7.7)
Platelets: 144 10*3/uL — ABNORMAL LOW (ref 150–400)
RBC: 4.63 MIL/uL (ref 3.87–5.11)
RDW: 15.3 % (ref 11.5–15.5)
WBC: 5.4 10*3/uL (ref 4.0–10.5)

## 2017-09-17 LAB — COMPREHENSIVE METABOLIC PANEL
ALK PHOS: 305 U/L — AB (ref 38–126)
ALT: 36 U/L (ref 14–54)
ANION GAP: 6 (ref 5–15)
AST: 50 U/L — ABNORMAL HIGH (ref 15–41)
Albumin: 3.8 g/dL (ref 3.5–5.0)
BUN: 16 mg/dL (ref 6–20)
CO2: 25 mmol/L (ref 22–32)
CREATININE: 1.05 mg/dL — AB (ref 0.44–1.00)
Calcium: 9.3 mg/dL (ref 8.9–10.3)
Chloride: 106 mmol/L (ref 101–111)
GFR, EST AFRICAN AMERICAN: 54 mL/min — AB (ref >60)
GFR, EST NON AFRICAN AMERICAN: 46 mL/min — AB (ref >60)
Glucose, Bld: 93 mg/dL (ref 65–99)
Potassium: 4.2 mmol/L (ref 3.5–5.1)
Sodium: 137 mmol/L (ref 135–145)
TOTAL PROTEIN: 7.5 g/dL (ref 6.5–8.1)
Total Bilirubin: 0.9 mg/dL (ref 0.3–1.2)

## 2017-09-17 LAB — PROTIME-INR
INR: 1.1
PROTHROMBIN TIME: 14.1 s (ref 11.4–15.2)

## 2017-09-17 MED ORDER — LIDOCAINE HCL 1 % IJ SOLN
INTRAMUSCULAR | Status: AC
  Filled 2017-09-17: qty 20

## 2017-09-17 MED ORDER — FENTANYL CITRATE (PF) 100 MCG/2ML IJ SOLN
INTRAMUSCULAR | Status: AC
  Filled 2017-09-17: qty 4

## 2017-09-17 MED ORDER — MIDAZOLAM HCL 2 MG/2ML IJ SOLN
INTRAMUSCULAR | Status: AC | PRN
  Administered 2017-09-17 (×2): 1 mg via INTRAVENOUS

## 2017-09-17 MED ORDER — MIDAZOLAM HCL 2 MG/2ML IJ SOLN
INTRAMUSCULAR | Status: AC
  Filled 2017-09-17: qty 4

## 2017-09-17 MED ORDER — NALOXONE HCL 0.4 MG/ML IJ SOLN
INTRAMUSCULAR | Status: AC
  Filled 2017-09-17: qty 1

## 2017-09-17 MED ORDER — SODIUM CHLORIDE 0.9 % IV SOLN
INTRAVENOUS | Status: DC
  Administered 2017-09-17: 11:00:00 via INTRAVENOUS

## 2017-09-17 MED ORDER — FLUMAZENIL 0.5 MG/5ML IV SOLN
INTRAVENOUS | Status: AC
  Filled 2017-09-17: qty 5

## 2017-09-17 MED ORDER — FENTANYL CITRATE (PF) 100 MCG/2ML IJ SOLN
INTRAMUSCULAR | Status: AC | PRN
  Administered 2017-09-17: 50 ug via INTRAVENOUS

## 2017-09-17 NOTE — Procedures (Signed)
Lung ca liver mets  S/p [place] RT LIVER MASS CORE BX  No comp Stable Path pending Full report in pacs

## 2017-09-17 NOTE — H&P (Signed)
Referring Physician(s): Mohamed,Mohamed  Supervising Physician: Daryll Brod  Patient Status:  [facility]  Chief Complaint:  "I'm having a liver biopsy"  Subjective: Patient familiar to IR service from prior right hepatic lobe lesion biopsy on 03/03/17 which revealed metastatic adenocarcinoma of the lung.  She is status post chemoradiation.  Recent CT reveals progression of liver disease and she presents again today for repeat image guided biopsy to check for any new mutations. She currently denies fever, headache, chest pain, worsening dyspnea, cough, back pain, vomiting or bleeding.  She does have fatigue, occasional upset stomach/upper abdominal discomfort and intermittent nausea. Past Medical History:  Diagnosis Date  . Adenocarcinoma of left lung, stage 4 ([place]) 03/18/2017  . Arthritis    Neck,shoulder  . Brain metastases ([place]) 04/06/2017  . Colonic polyp   . Difficulty sleeping   . Encounter for antineoplastic chemotherapy 04/06/2017  . History of radiation therapy 12/04/15-12/14/15   left upper lobe 60 Gy  . Hx of unilateral nephrectomy 1994   right   . Hyperlipemia   . Lung cancer ([place])   . Numbness in feet   . Peripheral neuropathy   . Personal history of PE (pulmonary embolism) 2011   Past Surgical History:  Procedure Laterality Date  . ABDOMINAL HYSTERECTOMY    . APPENDECTOMY    . cataracts Bilateral 09/2014   removal  . CESAREAN SECTION    . NEPHRECTOMY  1993   RT -   . TOTAL HIP ARTHROPLASTY Right 02/25/2013   Procedure: RIGHT TOTAL HIP ARTHROPLASTY ANTERIOR APPROACH;  Surgeon: Mcarthur Rossetti, MD;  Location: [facility];  Service: Orthopedics;  Laterality: Right;     Allergies: Penicillins  Medications: Prior to Admission medications   Medication Sig Start Date End Date Taking? Authorizing Provider  CALCIUM PO Take 1 tablet by mouth 2 (two) times daily.   Yes [provider]  celecoxib (CELEBREX) 200 MG capsule take 1 capsule by mouth once daily  if needed for NECK PAIN OR ARTHRITIS 11/30/15  Yes [provider]  gabapentin (NEURONTIN) 100 MG capsule 3 tablets at bedtime   Yes [provider]  metoprolol succinate (TOPROL-XL) 25 MG 24 hr tablet Take 0.5 tablets (12.5 mg total) by mouth daily. 06/19/15  Yes Jerline Pain, MD  Multiple Vitamin (MULTIVITAMIN WITH MINERALS) TABS Take 1 tablet by mouth daily.   Yes [provider]  rivaroxaban (XARELTO) 20 MG TABS tablet Take 20 mg by mouth daily with supper.   Yes [provider]  TAGRISSO 80 MG tablet TAKE 1 TABLET (80 MG TOTAL) BY MOUTH DAILY. 08/27/17  Yes Curt Bears, MD  prochlorperazine (COMPAZINE) 10 MG tablet Take 1 tablet (10 mg total) by mouth every 6 (six) hours as needed for nausea or vomiting. Patient not taking: Reported on 09/09/2017 09/09/17   Curt Bears, MD     Vital Signs: BP 133/65   Pulse 90   Temp 98 F (36.7 C) (Oral)   Resp 18   Ht 5\' 7"  (1.702 m)   Wt 165 lb (74.8 kg)   SpO2 100%   BMI 25.84 kg/m   Physical Exam awake, alert.  Chest with few fine right basilar crackles, left clear.  Heart with regular rate and rhythm.  Abdomen soft, positive bowel sounds, mild right upper quadrant/epigastric tenderness to palpation.  No significant lower extremity edema.  Imaging: No results found.  Labs:  CBC: Recent Labs    07/30/17 0956 08/20/17 0814 09/09/17 0838 09/17/17 1110  WBC 4.8 4.2 4.5 5.4  HGB 13.0 13.2 13.6 14.3  HCT 37.8 39.2 40.5 42.0  PLT 133* 130* 113* 144*    COAGS: Recent Labs    03/03/17 1112  INR 1.17  APTT 37*    BMP: Recent Labs    07/09/17 1206 07/30/17 0956 08/20/17 0814 09/09/17 0838  NA 136 138 139 138  K 4.4 4.1 4.1 4.2  CO2 21* 22 23 25   GLUCOSE 106 107 97 99  BUN 18.5 20.1 17.5 13.8  CALCIUM 9.6 9.2 9.3 9.5  CREATININE 1.1 1.2* 1.2* 1.2*    LIVER FUNCTION TESTS: Recent Labs    07/09/17 1206 07/30/17 0956 08/20/17 0814 09/09/17 0838  BILITOT 0.81 0.66 0.67  0.75  AST 53* 46* 52* 54*  ALT 61* 42 43 46  ALKPHOS 470* 334* 349* 307*  PROT 8.2 7.4 7.3 7.7  ALBUMIN 3.6 3.4* 3.3* 3.4*    Assessment and Plan: Patient with history of stage IV adenocarcinoma of the lung and prior chemoradiation.  Status post right hepatic lobe lesion biopsy on 03/03/17 revealing metastatic lung adenocarcinoma.  Recent CT reveals progression of liver disease and she presents again today for image guided liver lesion biopsy to check for any new mutations.Risks and benefits discussed with the patient including, but not limited to bleeding, infection, damage to adjacent structures or low yield requiring additional tests. All of the patient's questions were answered, patient is agreeable to proceed. Consent signed and in chart.     Electronically Signed: D. Rowe Robert, PA-C 09/17/2017, 11:42 AM   I spent a total of 20 minutes at the the patient's bedside AND on the patient's hospital floor or unit, greater than 50% of which was counseling/coordinating care for image guided liver lesion biopsy

## 2017-09-18 ENCOUNTER — Telehealth: Payer: Self-pay | Admitting: Medical Oncology

## 2017-09-18 NOTE — Telephone Encounter (Signed)
PT had liver bx yesterday . Last xarelto was Tuesday . Per Julien Nordmann I told pt she can restart her xarelto today .

## 2017-09-21 ENCOUNTER — Other Ambulatory Visit: Payer: Self-pay | Admitting: *Deleted

## 2017-09-21 DIAGNOSIS — C7931 Secondary malignant neoplasm of brain: Secondary | ICD-10-CM

## 2017-09-21 DIAGNOSIS — C7949 Secondary malignant neoplasm of other parts of nervous system: Principal | ICD-10-CM

## 2017-09-22 ENCOUNTER — Telehealth: Payer: Self-pay | Admitting: *Deleted

## 2017-09-22 NOTE — Telephone Encounter (Signed)
Pt called said "I had a fall, got foot caught up in a rug. My foot is swollen, I'm able to move my toes but my foot is swollen." Recommend pt go to Urgent care for xray/evaluation. Pt advised she will have a family member take her tomorrow. No further concerns.

## 2017-09-24 ENCOUNTER — Telehealth: Payer: Self-pay | Admitting: Medical Oncology

## 2017-09-24 NOTE — Telephone Encounter (Signed)
swelling around ankles. She is looking at getting TED hose-compression stockings, per Mohamed's recommendation.   I told her to contact [facility] and [facility] -and to call back for any concerns. She wants knee high and I told her it is her preference on kind of stocking.

## 2017-10-01 ENCOUNTER — Ambulatory Visit: Payer: Medicare Other

## 2017-10-01 ENCOUNTER — Ambulatory Visit: Payer: Medicare Other | Admitting: Internal Medicine

## 2017-10-01 ENCOUNTER — Other Ambulatory Visit: Payer: Medicare Other

## 2017-10-08 ENCOUNTER — Encounter: Payer: Self-pay | Admitting: Internal Medicine

## 2017-10-08 ENCOUNTER — Ambulatory Visit (HOSPITAL_BASED_OUTPATIENT_CLINIC_OR_DEPARTMENT_OTHER): Payer: Medicare Other | Admitting: Internal Medicine

## 2017-10-08 ENCOUNTER — Telehealth: Payer: Self-pay | Admitting: Internal Medicine

## 2017-10-08 ENCOUNTER — Telehealth: Payer: Self-pay | Admitting: Pharmacist

## 2017-10-08 ENCOUNTER — Telehealth: Payer: Self-pay | Admitting: Pharmacy Technician

## 2017-10-08 ENCOUNTER — Encounter: Payer: Self-pay | Admitting: *Deleted

## 2017-10-08 ENCOUNTER — Other Ambulatory Visit (HOSPITAL_BASED_OUTPATIENT_CLINIC_OR_DEPARTMENT_OTHER): Payer: Medicare Other

## 2017-10-08 ENCOUNTER — Encounter (HOSPITAL_COMMUNITY): Payer: Self-pay

## 2017-10-08 VITALS — BP 124/68 | HR 97 | Temp 97.9°F | Resp 18 | Ht 67.0 in | Wt 162.1 lb

## 2017-10-08 DIAGNOSIS — C3492 Malignant neoplasm of unspecified part of left bronchus or lung: Secondary | ICD-10-CM

## 2017-10-08 DIAGNOSIS — C3412 Malignant neoplasm of upper lobe, left bronchus or lung: Secondary | ICD-10-CM | POA: Diagnosis not present

## 2017-10-08 DIAGNOSIS — C787 Secondary malignant neoplasm of liver and intrahepatic bile duct: Secondary | ICD-10-CM

## 2017-10-08 DIAGNOSIS — C7931 Secondary malignant neoplasm of brain: Secondary | ICD-10-CM | POA: Diagnosis not present

## 2017-10-08 LAB — COMPREHENSIVE METABOLIC PANEL
ALBUMIN: 3.1 g/dL — AB (ref 3.5–5.0)
ALK PHOS: 339 U/L — AB (ref 40–150)
ALT: 39 U/L (ref 0–55)
ANION GAP: 9 meq/L (ref 3–11)
AST: 55 U/L — ABNORMAL HIGH (ref 5–34)
BILIRUBIN TOTAL: 0.99 mg/dL (ref 0.20–1.20)
BUN: 16.7 mg/dL (ref 7.0–26.0)
CO2: 24 mEq/L (ref 22–29)
Calcium: 9 mg/dL (ref 8.4–10.4)
Chloride: 105 mEq/L (ref 98–109)
Creatinine: 1.1 mg/dL (ref 0.6–1.1)
EGFR: 51 mL/min/{1.73_m2} — AB (ref >60)
Glucose: 117 mg/dl (ref 70–140)
Potassium: 4.3 mEq/L (ref 3.5–5.1)
Sodium: 138 mEq/L (ref 136–145)
TOTAL PROTEIN: 7.6 g/dL (ref 6.4–8.3)

## 2017-10-08 LAB — CBC WITH DIFFERENTIAL/PLATELET
BASO%: 1.3 % (ref 0.0–2.0)
Basophils Absolute: 0.1 10*3/uL (ref 0.0–0.1)
EOS ABS: 0.3 10*3/uL (ref 0.0–0.5)
EOS%: 4.1 % (ref 0.0–7.0)
HCT: 40.2 % (ref 34.8–46.6)
HEMOGLOBIN: 13.6 g/dL (ref 11.6–15.9)
LYMPH%: 28 % (ref 14.0–49.7)
MCH: 31.2 pg (ref 25.1–34.0)
MCHC: 33.9 g/dL (ref 31.5–36.0)
MCV: 92 fL (ref 79.5–101.0)
MONO#: 0.9 10*3/uL (ref 0.1–0.9)
MONO%: 12.6 % (ref 0.0–14.0)
NEUT%: 54 % (ref 38.4–76.8)
NEUTROS ABS: 3.7 10*3/uL (ref 1.5–6.5)
PLATELETS: 215 10*3/uL (ref 145–400)
RBC: 4.37 10*6/uL (ref 3.70–5.45)
RDW: 14.6 % — AB (ref 11.2–14.5)
WBC: 6.9 10*3/uL (ref 3.9–10.3)
lymph#: 1.9 10*3/uL (ref 0.9–3.3)

## 2017-10-08 MED ORDER — AFATINIB DIMALEATE 30 MG PO TABS: 30.0000 mg | ORAL_TABLET | Freq: Every day | ORAL | 2 refills | Status: DC

## 2017-10-08 MED FILL — GILOTRIF 30 MG TABLET: 30 | 30 days supply | Qty: 30 | Fill #0

## 2017-10-08 NOTE — Telephone Encounter (Signed)
Oral Oncology Pharmacist Encounter  Received new prescription for Gilotrif (afatinib) for the treatment of EGFR-mutation positive (EGFR S768I, V769L) NSCLC, planned duration until disease progression or unacceptable toxicity.  Labs from today (10/08/17) assessed, OK for treatment. Noted SCr=1.1, est CrCl~40 mL/min, patient is s/p R nephrectomy. No dose adjustments recommended by manufacturer with eGFR > 30 mL/min. Dose reduced to 58m daily by MD for increased toleration.  Current medication list in Epic reviewed, no DDIs with Gilotrif identified.  [facility]. Test claim revealed copayment of $0 at this time. Patient has likely met out of pocket max for calendar year. Patient will be instructed to alert the office if copayment becomes prohibitively expensive.  Oral Oncology Clinic will continue to follow for initial counseling and start date.  JJohny Drilling PharmD, BCPS, BCOP 10/08/2017 3:19 PM Oral Oncology Clinic [phone]

## 2017-10-08 NOTE — Telephone Encounter (Signed)
Oral Oncology Patient Advocate Encounter  Prior Authorization for [facility] [place] has been approved.    PA# [id number] Effective dates: 10/08/2017 through 10/19/2018  Oral Oncology Clinic will continue to follow.   Fabio Asa. Melynda Keller, [place] Patient [place] [phone] 10/08/2017 11:04 AM

## 2017-10-08 NOTE — Telephone Encounter (Signed)
Oral Chemotherapy Pharmacist Encounter   I spoke with patient and son in exam room for overview of: Gilotrif.   Pt is doing well. Counseled patient on administration, dosing, side effects, monitoring, drug-food interactions, safe handling, storage, and disposal.  Patient will take Gilotrif 30 tablets, 1 tablet by mouth once daily, on an empty stomach, 1 hour before or 2 hours after a meal. Gilotrif start date: after christmas  Side effects include but not limited to: rash, diarrhea, nausea, vomiting, decreased appetitie, fatigue, mouth sores, and interstitial lung disease.  Patient will get some loperamide in case diarrhea develops, and will call the office if experiencing side effects.   Reviewed with patient importance of keeping a medication schedule and plan for any missed doses.  Ms. Jamerson voiced understanding and appreciation.   All questions answered. Medication reconciliation performed and medication/allergy list updated.  Gilotrif will ship from the [facility] tomorrow for delivery to patient's home on Monday 10/12/17.   Patient knows to call the office with questions or concerns. Oral Oncology Clinic will continue to follow.  Thank you,  Johny Drilling, PharmD, BCPS, BCOP 3:37 PM   10/08/2017 Oral Oncology Clinic [phone]

## 2017-10-08 NOTE — Telephone Encounter (Signed)
Scheduled appt per 12/20 los - Gave patient AVS and calender per los.  

## 2017-10-08 NOTE — Telephone Encounter (Signed)
Oral Oncology Patient Advocate Encounter  Received notification from [place] that prior authorization for Gilotrif is required.  PA submitted on CoverMyMeds Key [id number] Status is pending  Oral Oncology Clinic will continue to follow.  Fabio Asa. Melynda Keller, [place] Patient [place] [phone] 10/08/2017 10:39 AM

## 2017-10-08 NOTE — Progress Notes (Signed)
[name] [phone]   [phone]  OFFICE PROGRESS NOTE  Derinda Late, MD [place] [address]  DIAGNOSIS: Stage IV (T1a, N2, M1b) non-small cell lung cancer, adenocarcinoma with positive EGFR mutation with deletion in exon 19 diagnosed initially in January 2017, with metastatic disease in May 2018 based on liver biopsy.  Repeat Foundation One test on 09/17/17 Biomarker Findings Microsatellite Status - MS-Stable Tumor Mutational Burden - TMB-Low (5 Muts/Mb) Genomic Findings For a complete list of the genes assayed, please refer to the Appendix. EGFR S768I, V769L CDKN2A/B loss MTAP loss exons 6-8 NFKBIA amplification NKX2-1 amplification TP53 R337L 7 Disease relevant genes with no reportable alterations: KRAS, ALK, BRAF, MET, RET, ERBB2, ROS1  PRIOR THERAPY:  1) Stereotactic body radiotherapy to the left upper lobe lung nodule under the care of Dr. Pablo Ledger without biopsy completed 12/14/2015. 2) stereotactic radiotherapy to a solitary brain metastasis under the care of Dr. Lisbeth Renshaw on 04/21/2017. 3) Tagrisso 80 mg by mouth daily, first dose started 04/09/2017. 4) Tagrisso 80 mg by mouth daily and Avastin 15 MG/KG every 3 weeks was added starting 07/09/2017 secondary to disease progression on single agent Tagrisso.  Status post 3 cycles with Avastin.  Last dose was given August 20, 2017 this was discontinued secondary to disease progression.   CURRENT THERAPY: GILOTRIF 30 mg p.o. 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  She is expected to start the first dose of this treatment in the next few days.  INTERVAL HISTORY: [name] 83 y.o. female returns to the clinic today for follow-up visit accompanied by her son.  The patient is feeling fine today except for fatigue and weight loss.  She denied having any current chest pain, shortness of breath, cough or hemoptysis.  She denied having any fever or chills.   She has no nausea, vomiting, diarrhea or constipation.  She was seen for a second opinion at [facility] and they were in agreement with the current plan.  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  She is here today for evaluation and discussion of her treatment options based on the new findings.   MEDICAL HISTORY: Past Medical History:  Diagnosis Date  . Adenocarcinoma of left lung, stage 4 ([place]) 03/18/2017  . Arthritis    Neck,shoulder  . Brain metastases ([place]) 04/06/2017  . Colonic polyp   . Difficulty sleeping   . Encounter for antineoplastic chemotherapy 04/06/2017  . History of radiation therapy 12/04/15-12/14/15   left upper lobe 60 Gy  . Hx of unilateral nephrectomy 1994   right   . Hyperlipemia   . Lung cancer ([place])   . Numbness in feet   . Peripheral neuropathy   . Personal history of PE (pulmonary embolism) 2011    ALLERGIES:  is allergic to penicillins.  MEDICATIONS:  Current Outpatient Medications  Medication Sig Dispense Refill  . CALCIUM CARBONATE PO Take 1 tablet by mouth daily.    Marland Kitchen gabapentin (NEURONTIN) 100 MG capsule 3 tablets at bedtime    . metoprolol succinate (TOPROL-XL) 25 MG 24 hr tablet Take 0.5 tablets (12.5 mg total) by mouth daily. 45 tablet 3  . Multiple Vitamin (MULTIVITAMIN WITH MINERALS) TABS Take 1 tablet by mouth daily.    . prochlorperazine (COMPAZINE) 10 MG tablet Take 1 tablet (10 mg total) by mouth every 6 (six) hours as  needed for nausea or vomiting. 30 tablet 0  . rivaroxaban (XARELTO) 20 MG TABS tablet Take 20 mg by mouth daily with supper.    Marland Kitchen TAGRISSO 80 MG tablet TAKE 1 TABLET (80 MG TOTAL) BY MOUTH DAILY. 30 tablet 0  . celecoxib (CELEBREX) 200 MG capsule take 1 capsule by mouth once daily if needed for NECK PAIN OR ARTHRITIS  0   No current facility-administered medications for this visit.      SURGICAL HISTORY:  Past Surgical History:  Procedure Laterality Date  . ABDOMINAL HYSTERECTOMY    . APPENDECTOMY    . cataracts Bilateral 09/2014   removal  . CESAREAN SECTION    . NEPHRECTOMY  1993   RT -   . TOTAL HIP ARTHROPLASTY Right 02/25/2013   Procedure: RIGHT TOTAL HIP ARTHROPLASTY ANTERIOR APPROACH;  Surgeon: Mcarthur Rossetti, MD;  Location: [facility];  Service: Orthopedics;  Laterality: Right;    REVIEW OF SYSTEMS:  Constitutional: positive for fatigue and weight loss Eyes: negative Ears, nose, mouth, throat, and face: negative Respiratory: negative Cardiovascular: negative Gastrointestinal: negative Genitourinary:negative Integument/breast: negative Hematologic/lymphatic: negative Musculoskeletal:negative Neurological: negative Behavioral/Psych: negative Endocrine: negative Allergic/Immunologic: negative   PHYSICAL EXAMINATION: General appearance: alert, cooperative, fatigued and no distress Head: Normocephalic, without obvious abnormality, atraumatic Neck: no adenopathy, no JVD, supple, symmetrical, trachea midline and thyroid not enlarged, symmetric, no tenderness/mass/nodules Lymph nodes: Cervical, supraclavicular, and axillary nodes normal. Resp: clear to auscultation bilaterally Back: symmetric, no curvature. ROM normal. No CVA tenderness. Cardio: regular rate and rhythm, S1, S2 normal, no murmur, click, rub or gallop GI: soft, non-tender; bowel sounds normal; no masses,  no organomegaly Extremities: extremities normal, atraumatic, no cyanosis or edema Neurologic: Alert and oriented X 3, normal strength and tone. Normal symmetric reflexes. Normal coordination and gait  ECOG PERFORMANCE STATUS: 1 - Symptomatic but completely ambulatory  Blood pressure 124/68, pulse 97, temperature 97.9 F (36.6 C), temperature source Oral, resp. rate 18, height _0  (1.702 m), weight 162 lb 1.6 oz (73.5 kg), SpO2 100 %.  LABORATORY DATA: Lab Results  Component  Value Date   WBC 6.9 10/08/2017   HGB 13.6 10/08/2017   HCT 40.2 10/08/2017   MCV 92.0 10/08/2017   PLT 215 10/08/2017      Chemistry      Component Value Date/Time   NA 138 10/08/2017 0916   K 4.3 10/08/2017 0916   CL 106 09/17/2017 1110   CO2 24 10/08/2017 0916   BUN 16.7 10/08/2017 0916   CREATININE 1.1 10/08/2017 0916      Component Value Date/Time   CALCIUM 9.0 10/08/2017 0916   ALKPHOS 339 (H) 10/08/2017 0916   AST 55 (H) 10/08/2017 0916   ALT 39 10/08/2017 0916   BILITOT 0.99 10/08/2017 0916       RADIOGRAPHIC STUDIES: US Biopsy (liver)  Result Date: 09/17/2017 INDICATION: Non-small-cell lung cancer, stage IV, enlarging liver metastasis EXAM: ULTRASOUND RIGHT LIVER METASTASIS CORE BIOPSY MEDICATIONS: 1% LIDOCAINE LOCAL ANESTHESIA/SEDATION: Moderate (conscious) sedation was employed during this procedure. A total of Versed 2.0 mg and Fentanyl 50 mcg was administered intravenously. Moderate Sedation Time: 10 minutes. The patient's level of consciousness and vital signs were monitored continuously by radiology nursing throughout the procedure under my direct supervision. FLUOROSCOPY TIME:  Fluoroscopy Time: None. COMPLICATIONS: None immediate. PROCEDURE: Informed written consent was obtained from the patient after a thorough discussion of the procedural risks, benefits and alternatives. All questions were addressed. Maximal Sterile Barrier Technique was utilized including caps, mask, sterile  gowns, sterile gloves, sterile drape, hand hygiene and skin antiseptic. A timeout was performed prior to the initiation of the procedure. Previous imaging reviewed. Preliminary ultrasound performed. The ill-defined echogenic solid mass in the right lobe was localized. Overlying skin marked in a lower intercostal space in the mid axillary line. Under sterile conditions and local anesthesia, a 17 gauge 11.8 cm access needle was advanced percutaneously into the lesion in the right hepatic lobe  posteriorly. Needle position confirmed with ultrasound. 3 18 gauge core biopsies obtained. These were placed in formalin. Needle removed. Postprocedure imaging demonstrates no hemorrhage or hematoma. Patient tolerated the biopsy well. IMPRESSION: Successful ultrasound right hepatic mass 18 gauge core biopsy Electronically Signed   By: Jerilynn Mages.  Shick M.D.   On: 09/17/2017 13:51    ASSESSMENT AND PLAN:  This is a very pleasant 83 years old African-American female with metastatic non-small cell lung cancer that was initially diagnosed as a stage IA status post stereotactic radiotherapy of the left upper lobe and now she presented with disease recurrence as well as mediastinal lymphadenopathy and liver metastasis and solitary brain metastasis. 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 She underwent stereotactic radiotherapy to a solitary brain metastasis. She is currently on treatment with Tagrisso 80 mg by mouth daily status post 4 months. He was also started recently on Avastin 15 MG/KG every 3 weeks is status post 3 cycles.  This treatment was discontinued secondary to disease progression. 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  I had a lengthy discussion with the patient and her son about her current condition and treatment options. I gave the patient again the option of palliative care and hospice referral versus consideration of treatment with EGFR tyrosine kinase, GILOTRIF which showed activity in patient with uncommon mutation versus systemic chemotherapy. The patient has no interest in palliative care or systemic chemotherapy at this point.  She would like to proceed with the treatment with GILOTRIF. I discussed with the patient the adverse effect of this treatment including but not limited to skin rash, diarrhea, interstitial lung disease, liver or renal dysfunction. She is expected to start the treatment with  GILOTRIF in the next few days. I will see her back for follow-up visit in 2 weeks for evaluation and management of any adverse effect of her treatment. She was advised to call immediately if she has any concerning symptoms in the interval. The patient voices understanding of current disease status and treatment options and is in agreement with the current care plan. All questions were answered. The patient knows to call the clinic with any problems, questions or concerns. We can certainly see the patient much sooner if necessary.  Disclaimer: This note was dictated with voice recognition software. Similar sounding words can inadvertently be transcribed and may not be corrected upon review.

## 2017-10-21 ENCOUNTER — Telehealth: Payer: Self-pay

## 2017-10-21 NOTE — Telephone Encounter (Signed)
Nutrition  Patient identified on Malnutrition Screening report for weight loss.  Called and spoke with patient and offered nutrition appointment.  Patient agreeable and appointment made on Tuesday, Jan 8 at 10:30 am following NP appointment.  Discussed with patient and agreeable to appointment time and date.  Iridiana Fonner B. Zenia Resides, [address], [place] Registered Dietitian [phone] (pager)

## 2017-10-22 ENCOUNTER — Ambulatory Visit: Payer: Medicare Other | Admitting: Internal Medicine

## 2017-10-22 ENCOUNTER — Other Ambulatory Visit: Payer: Medicare Other

## 2017-10-22 ENCOUNTER — Ambulatory Visit: Payer: Medicare Other

## 2017-10-27 ENCOUNTER — Encounter: Payer: Self-pay | Admitting: Oncology

## 2017-10-27 ENCOUNTER — Inpatient Hospital Stay: Payer: Medicare Other

## 2017-10-27 ENCOUNTER — Inpatient Hospital Stay: Payer: Medicare Other | Attending: Oncology | Admitting: Oncology

## 2017-10-27 ENCOUNTER — Telehealth: Payer: Self-pay | Admitting: Oncology

## 2017-10-27 VITALS — BP 140/82 | HR 98 | Temp 97.7°F | Resp 18 | Ht 67.0 in | Wt 160.0 lb

## 2017-10-27 DIAGNOSIS — R490 Dysphonia: Secondary | ICD-10-CM | POA: Diagnosis not present

## 2017-10-27 DIAGNOSIS — C3492 Malignant neoplasm of unspecified part of left bronchus or lung: Secondary | ICD-10-CM

## 2017-10-27 DIAGNOSIS — C3412 Malignant neoplasm of upper lobe, left bronchus or lung: Secondary | ICD-10-CM | POA: Diagnosis not present

## 2017-10-27 DIAGNOSIS — C7931 Secondary malignant neoplasm of brain: Secondary | ICD-10-CM | POA: Diagnosis not present

## 2017-10-27 DIAGNOSIS — Z5111 Encounter for antineoplastic chemotherapy: Secondary | ICD-10-CM

## 2017-10-27 DIAGNOSIS — R5381 Other malaise: Secondary | ICD-10-CM

## 2017-10-27 DIAGNOSIS — R634 Abnormal weight loss: Secondary | ICD-10-CM | POA: Insufficient documentation

## 2017-10-27 DIAGNOSIS — C787 Secondary malignant neoplasm of liver and intrahepatic bile duct: Secondary | ICD-10-CM | POA: Diagnosis not present

## 2017-10-27 DIAGNOSIS — R0989 Other specified symptoms and signs involving the circulatory and respiratory systems: Secondary | ICD-10-CM | POA: Insufficient documentation

## 2017-10-27 LAB — COMPREHENSIVE METABOLIC PANEL
ALBUMIN: 3.5 g/dL (ref 3.5–5.0)
ALT: 35 U/L (ref 0–55)
AST: 44 U/L — AB (ref 5–34)
Alkaline Phosphatase: 369 U/L — ABNORMAL HIGH (ref 40–150)
Anion gap: 7 (ref 3–11)
BILIRUBIN TOTAL: 0.8 mg/dL (ref 0.2–1.2)
BUN: 17 mg/dL (ref 7–26)
CO2: 27 mmol/L (ref 22–29)
Calcium: 9.6 mg/dL (ref 8.4–10.4)
Chloride: 104 mmol/L (ref 98–109)
Creatinine, Ser: 1.15 mg/dL — ABNORMAL HIGH (ref 0.60–1.10)
GFR calc Af Amer: 48 mL/min — ABNORMAL LOW (ref >60)
GFR calc non Af Amer: 42 mL/min — ABNORMAL LOW (ref >60)
GLUCOSE: 88 mg/dL (ref 70–140)
Potassium: 4.2 mmol/L (ref 3.3–4.7)
SODIUM: 138 mmol/L (ref 136–145)
Total Protein: 8.2 g/dL (ref 6.4–8.3)

## 2017-10-27 LAB — CBC WITH DIFFERENTIAL/PLATELET
Abs Granulocyte: 3 10*3/uL (ref 1.5–6.5)
Basophils Absolute: 0 10*3/uL (ref 0.0–0.1)
Basophils Relative: 1 %
EOS ABS: 0.3 10*3/uL (ref 0.0–0.5)
Eosinophils Relative: 6 %
HEMATOCRIT: 40.8 % (ref 34.8–46.6)
HEMOGLOBIN: 13.6 g/dL (ref 11.6–15.9)
LYMPHS ABS: 2.1 10*3/uL (ref 0.9–3.3)
Lymphocytes Relative: 35 %
MCH: 31 pg (ref 25.1–34.0)
MCHC: 33.3 g/dL (ref 31.5–36.0)
MCV: 92.9 fL (ref 79.5–101.0)
MONO ABS: 0.5 10*3/uL (ref 0.1–0.9)
MONOS PCT: 9 %
NEUTROS ABS: 3 10*3/uL (ref 1.5–6.5)
Neutrophils Relative %: 49 %
Platelets: 229 10*3/uL (ref 145–400)
RBC: 4.39 MIL/uL (ref 3.70–5.45)
RDW: 14.3 % (ref 11.2–16.1)
WBC: 5.9 10*3/uL (ref 3.9–10.3)

## 2017-10-27 LAB — UA PROTEIN, DIPSTICK - CHCC: Protein, ur: NEGATIVE mg/dL

## 2017-10-27 NOTE — Progress Notes (Signed)
Nutrition Assessment   Reason for Assessment:  Patient identified on Malnutrition Screening report for poor appetite and weight loss  ASSESSMENT:  84 year old female with metastatic lung cancer with mets to liver and brain.  Patient currently on gilotrif.  Past medical history of HLD, PE,  Patient seen in clinic with son.  Patient reports decreased appetite, food has "off taste" especially starches.  Reports some nausea with sweeter foods but controlled with nausea medication.  Patient reports some constipation, although reports that a side effect of chemotherapy will be diarrhea but she has not experienced this yet.  Reports that typically she will eat oatmeal with fruit and greek yogurt then about 1 hour later will have egg and cheese with toast and coffee.  For lunch will have protein, vegetable or soup or sandwich and for dinner will have protein, starch and vegetable.  Reports that she is drinking ensure original daily and is tolerating well.  Also makes smoothie sometimes of fruit, yogurt and milk or water.  Patient reports amount of food is decreased from typical intake.  "I force myself to eat."  Nutrition Focused Physical Exam: deferred  Medications: MVI, compazine, calcium carbonate  Labs: reviewed  Anthropometrics:   Height: 67 inches Weight: 160 lb today UBW: 170 lb per pt report. Noted 172 lb in April 2018 BMI: 25  7% weight loss in the last 9 months   Estimated Energy Needs  Kcals: 1850-2200 calories/d Protein: 89-111 g/d Fluid: 2.2 L/d  NUTRITION DIAGNOSIS: Inadequate oral intake related to cancer and cancer related therapy as evidenced by 7% weight loss, taste changes and poor appetite.   INTERVENTION:   Discussed ways to increase calories and protein.  Would encourage patient to liberalize diet due to weight loss, poor appetite and taste changes. Encouraged oral nutrition supplements 1-2 per day and samples and coupons given today Encouraged patient to  consider adding bedtime snack for additional calories and protein (snack options discussed) Contact information provided.      MONITORING, EVALUATION, GOAL: Patient will consume adequate calories and protein to maintain weight   NEXT VISIT: Jan 21 with Ernestene Kiel  Johnny Latu B. Zenia Resides, [place], [place] Registered Dietitian [phone] (pager)

## 2017-10-27 NOTE — Telephone Encounter (Signed)
Scheduled appt per 1/8 los - Gave patient AVS and calender per los.  

## 2017-10-27 NOTE — Assessment & Plan Note (Signed)
This is a very pleasant 84 year old African-American female with metastatic non-small cell lung cancer that was initially diagnosed as a stage IA status post stereotactic radiotherapy of the left upper lobe and now she presented with disease recurrence as well as mediastinal lymphadenopathy and liver metastasis and solitary brain metastasis. 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 She underwent stereotactic radiotherapy to a solitary brain metastasis. She was treated with Tagrisso 80 mg by mouth daily status post 4 months. She also received Avastin 15 MG/KG every 3 weeks is status post 3 cycles.  This treatment was discontinued secondary to disease progression. 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  The patient is now on Gilotrif 30 mg daily.  Status post 2 weeks of treatment and tolerating the treatment well overall. Recommend that she continue her treatment with Gilotrif. I will see her back for follow-up visit in 2 weeks for evaluation and management of any adverse effect of her treatment.  For her runny nose and hoarseness, I recommend that she try Claritin or Zyrtec over-the-counter to help with her symptoms.  For being off balance, deconditioning, and gait I have made a referral to physical therapy.  For her weight loss, she will meet with our dietitian later today.  She was advised to call immediately if she has any concerning symptoms in the interval. The patient voices understanding of current disease status and treatment options and is in agreement with the current care plan. All questions were answered. The patient knows to call the clinic with any problems, questions or concerns. We can certainly see the patient much sooner if necessary.

## 2017-10-27 NOTE — Progress Notes (Signed)
[facility] OFFICE PROGRESS NOTE  Derinda Late, MD [place] [address]  DIAGNOSIS: Stage IV (T1a, N2, M1b) non-small cell lung cancer, adenocarcinoma with positive EGFR mutation with deletion in exon 19 diagnosed initially in January 2017, with metastatic disease in May 2018 based on liver biopsy.  Repeat Foundation One test on 09/17/17 Biomarker Findings Microsatellite Status - MS-Stable Tumor Mutational Burden - TMB-Low (5 Muts/Mb) Genomic Findings For a complete list of the genes assayed, please refer to the Appendix. EGFR S768I, V769L CDKN2A/B loss MTAP loss exons 6-8 NFKBIA amplification NKX2-1 amplification TP53 R337L 7 Disease relevant genes with no reportable alterations: KRAS, ALK, BRAF, MET, RET, ERBB2, ROS1  PRIOR THERAPY: 1) Stereotactic body radiotherapy to the left upper lobe lung nodule under the care of Dr. Pablo Ledger without biopsy completed 12/14/2015. 2) stereotactic radiotherapy to a solitary brain metastasis under the care of Dr. Lisbeth Renshaw on 04/21/2017. 3) Tagrisso 80 mg by mouth daily, first dose started 04/09/2017. 4) Tagrisso 80 mg by mouth daily and Avastin 15 MG/KG every 3 weeks was added starting 07/09/2017 secondary to disease progression on single agent Tagrisso.  Status post 3 cycles with Avastin.  Last dose was given August 20, 2017 this was discontinued secondary to disease progression.  CURRENT THERAPY: GILOTRIF 30 mg p.o. 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    First dose 10/12/2017.  INTERVAL HISTORY: LATEKA RADY 84 y.o. female returns for routine follow-up visit accompanied by her son.  The patient is feeling fine today except for fatigue, weight loss, runny nose, and scratchy throat at times.  The fatigue and weight loss have been ongoing and are not new since starting the Gilotrif.  Patient also reports that her runny nose and scratchy throat predated the Gilotrif.  She states that she has a new  cleaning lady who is using a lot of chemicals which may exacerbate her symptoms.  She has not taken anything for this.  She denies fevers and chills.  Denies chest pain, shortness of breath, cough, hemoptysis.  Denies nausea, vomiting, constipation, diarrhea.  Denies rashes.  The patient reports that she is losing her balance at times and had a fall about 1 month ago.  She is requesting referral to physical therapy.  The patient is here for evaluation and repeat blood work.  MEDICAL HISTORY: Past Medical History:  Diagnosis Date  . Adenocarcinoma of left lung, stage 4 ([facility]) 03/18/2017  . Arthritis    Neck,shoulder  . Brain metastases ([facility]) 04/06/2017  . Colonic polyp   . Difficulty sleeping   . Encounter for antineoplastic chemotherapy 04/06/2017  . History of radiation therapy 12/04/15-12/14/15   left upper lobe 60 Gy  . Hx of unilateral nephrectomy 1994   right   . Hyperlipemia   . Lung cancer ([facility])   . Numbness in feet   . Peripheral neuropathy   . Personal history of PE (pulmonary embolism) 2011    ALLERGIES:  is allergic to penicillins.  MEDICATIONS:  Current Outpatient Medications  Medication Sig Dispense Refill  . afatinib dimaleate (GILOTRIF) 30 MG tablet Take 1 tablet (30 mg total) by mouth daily. Take on an empty stomach 1hr before or 2hrs after meals. 30 tablet 2  . CALCIUM CARBONATE PO Take 1 tablet by mouth daily.    Marland Kitchen gabapentin (NEURONTIN) 100 MG capsule 3 tablets at bedtime    . metoprolol succinate (TOPROL-XL) 25 MG 24 hr tablet Take 0.5 tablets (12.5 mg total) by mouth daily.  45 tablet 3  . Multiple Vitamin (MULTIVITAMIN WITH MINERALS) TABS Take 1 tablet by mouth daily.    . prochlorperazine (COMPAZINE) 10 MG tablet Take 1 tablet (10 mg total) by mouth every 6 (six) hours as needed for nausea or vomiting. 30 tablet 0  . rivaroxaban (XARELTO) 20 MG TABS tablet Take 20 mg by mouth daily with supper.    . celecoxib (CELEBREX) 200 MG capsule take 1 capsule by mouth once  daily if needed for NECK PAIN OR ARTHRITIS  0   No current facility-administered medications for this visit.     SURGICAL HISTORY:  Past Surgical History:  Procedure Laterality Date  . ABDOMINAL HYSTERECTOMY    . APPENDECTOMY    . cataracts Bilateral 09/2014   removal  . CESAREAN SECTION    . NEPHRECTOMY  1993   RT -   . TOTAL HIP ARTHROPLASTY Right 02/25/2013   Procedure: RIGHT TOTAL HIP ARTHROPLASTY ANTERIOR APPROACH;  Surgeon: Mcarthur Rossetti, MD;  Location: [facility];  Service: Orthopedics;  Laterality: Right;    REVIEW OF SYSTEMS:   Review of Systems  Constitutional: Negative for appetite change, chills, fever. Positive for fatigue and weight loss. HENT:   Negative for mouth sores, nosebleeds, sore throat and trouble swallowing.  Positive for runny nose with clear drainage and hoarseness in her voice. Eyes: Negative for eye problems and icterus.  Respiratory: Negative for cough, hemoptysis, shortness of breath and wheezing.   Cardiovascular: Negative for chest pain and leg swelling.  Gastrointestinal: Negative for abdominal pain, constipation, diarrhea, nausea and vomiting.  Genitourinary: Negative for bladder incontinence, difficulty urinating, dysuria, frequency and hematuria.   Musculoskeletal: Negative for back pain, gait problem, neck pain and neck stiffness.  Skin: Negative for itching and rash.  Neurological: Negative for dizziness, extremity weakness, headaches, light-headedness and seizures. Positive for being off balance at times.  Fall 1 month ago.  Uses a cane. Hematological: Negative for adenopathy. Does not bruise/bleed easily.  Psychiatric/Behavioral: Negative for confusion, depression and sleep disturbance. The patient is not nervous/anxious.     PHYSICAL EXAMINATION:  Blood pressure 140/82, pulse 98, temperature 97.7 F (36.5 C), temperature source Oral, resp. rate 18, height '5\' 7"'  (1.702 m), weight 160 lb (72.6 kg), SpO2 100 %.  ECOG PERFORMANCE STATUS:  1 - Symptomatic but completely ambulatory  Physical Exam  Constitutional: Oriented to person, place, and time and well-developed, well-nourished, and in no distress. No distress.  HENT:  Head: Normocephalic and atraumatic.  Mouth/Throat: Oropharynx is clear and moist. No oropharyngeal exudate.  Eyes: Conjunctivae are normal. Right eye exhibits no discharge. Left eye exhibits no discharge. No scleral icterus.  Neck: Normal range of motion. Neck supple.  Cardiovascular: Normal rate, regular rhythm, normal heart sounds and intact distal pulses.   Pulmonary/Chest: Effort normal and breath sounds normal. No respiratory distress. No wheezes. No rales.  Abdominal: Soft. Bowel sounds are normal. Exhibits no distension and no mass. There is no tenderness.  Musculoskeletal: Normal range of motion. Exhibits no edema.  Lymphadenopathy:    No cervical adenopathy.  Neurological: Alert and oriented to person, place, and time. Exhibits normal muscle tone. Coordination normal.  Skin: Skin is warm and dry. No rash noted. Not diaphoretic. No erythema. No pallor.  Psychiatric: Mood, memory and judgment normal.  Vitals reviewed.  LABORATORY DATA: Lab Results  Component Value Date   WBC 5.9 10/27/2017   HGB 13.6 10/27/2017   HCT 40.8 10/27/2017   MCV 92.9 10/27/2017   PLT 229 10/27/2017  Chemistry      Component Value Date/Time   NA 138 10/27/2017 0851   NA 138 10/08/2017 0916   K 4.2 10/27/2017 0851   K 4.3 10/08/2017 0916   CL 104 10/27/2017 0851   CO2 27 10/27/2017 0851   CO2 24 10/08/2017 0916   BUN 17 10/27/2017 0851   BUN 16.7 10/08/2017 0916   CREATININE 1.15 (H) 10/27/2017 0851   CREATININE 1.1 10/08/2017 0916      Component Value Date/Time   CALCIUM 9.6 10/27/2017 0851   CALCIUM 9.0 10/08/2017 0916   ALKPHOS 369 (H) 10/27/2017 0851   ALKPHOS 339 (H) 10/08/2017 0916   AST 44 (H) 10/27/2017 0851   AST 55 (H) 10/08/2017 0916   ALT 35 10/27/2017 0851   ALT 39 10/08/2017 0916    BILITOT 0.8 10/27/2017 0851   BILITOT 0.99 10/08/2017 0916       RADIOGRAPHIC STUDIES:  No results found.   ASSESSMENT/PLAN:  Adenocarcinoma of left lung, stage 4 ([facility]) This is a very pleasant 84 year old African-American female with metastatic non-small cell lung cancer that was initially diagnosed as a stage IA status post stereotactic radiotherapy of the left upper lobe and now she presented with disease recurrence as well as mediastinal lymphadenopathy and liver metastasis and solitary brain metastasis. 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 She underwent stereotactic radiotherapy to a solitary brain metastasis. She was treated with Tagrisso 80 mg by mouth daily status post 4 months. She also received Avastin 15 MG/KG every 3 weeks is status post 3 cycles.  This treatment was discontinued secondary to disease progression. 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  The patient is now on Gilotrif 30 mg daily.  Status post 2 weeks of treatment and tolerating the treatment well overall. Recommend that she continue her treatment with Gilotrif. I will see her back for follow-up visit in 2 weeks for evaluation and management of any adverse effect of her treatment.  For her runny nose and hoarseness, I recommend that she try Claritin or Zyrtec over-the-counter to help with her symptoms.  For being off balance, deconditioning, and gait I have made a referral to physical therapy.  For her weight loss, she will meet with our dietitian later today.  She was advised to call immediately if she has any concerning symptoms in the interval. The patient voices understanding of current disease status and treatment options and is in agreement with the current care plan. All questions were answered. The patient knows to call the clinic with any problems, questions or concerns. We can certainly see the patient  much sooner if necessary.  Orders Placed This Encounter  Procedures  . CBC with Differential (Cancer Center Only)    Standing Status:   Future    Standing Expiration Date:   10/27/2018  . CMP ([facility] only)    Standing Status:   Future    Standing Expiration Date:   10/27/2018  . Ambulatory referral to Physical Therapy    Referral Priority:   Routine    Referral Type:   Physical Medicine    Referral Reason:   Specialty Services Required    Requested Specialty:   Physical Therapy    Number of Visits Requested:   [place], DNP, AGPCNP-BC, AOCNP 10/27/17

## 2017-10-28 ENCOUNTER — Ambulatory Visit: Payer: Medicare Other | Attending: Oncology | Admitting: Physical Therapy

## 2017-10-28 ENCOUNTER — Encounter: Payer: Self-pay | Admitting: Physical Therapy

## 2017-10-28 DIAGNOSIS — M6281 Muscle weakness (generalized): Secondary | ICD-10-CM | POA: Diagnosis present

## 2017-10-28 DIAGNOSIS — R262 Difficulty in walking, not elsewhere classified: Secondary | ICD-10-CM | POA: Diagnosis present

## 2017-10-28 DIAGNOSIS — R293 Abnormal posture: Secondary | ICD-10-CM | POA: Diagnosis present

## 2017-10-28 NOTE — Therapy (Signed)
[facility], [place], [address] Phone: [phone]   Fax:  [phone]  Physical Therapy Evaluation  Patient Details  Name: [name] MRN: [id number] Date of Birth: 1928-09-05 Referring Provider: Mikey Bussing   Encounter Date: 10/28/2017  PT End of Session - 10/28/17 1650    Visit Number  1    Number of Visits  9    Date for PT Re-Evaluation  11/25/17    PT Start Time  1611 pt arrived late    PT Stop Time  1645    PT Time Calculation (min)  34 min    Activity Tolerance  Patient tolerated treatment well    Behavior During Therapy  [facility] for tasks assessed/performed       Past Medical History:  Diagnosis Date  . Adenocarcinoma of left lung, stage 4 ([facility]) 03/18/2017  . Arthritis    Neck,shoulder  . Brain metastases ([facility]) 04/06/2017  . Colonic polyp   . Difficulty sleeping   . Encounter for antineoplastic chemotherapy 04/06/2017  . History of radiation therapy 12/04/15-12/14/15   left upper lobe 60 Gy  . Hx of unilateral nephrectomy 1994   right   . Hyperlipemia   . Lung cancer ([facility])   . Numbness in feet   . Peripheral neuropathy   . Personal history of PE (pulmonary embolism) 2011    Past Surgical History:  Procedure Laterality Date  . ABDOMINAL HYSTERECTOMY    . APPENDECTOMY    . cataracts Bilateral 09/2014   removal  . CESAREAN SECTION    . NEPHRECTOMY  1993   RT -   . TOTAL HIP ARTHROPLASTY Right 02/25/2013   Procedure: RIGHT TOTAL HIP ARTHROPLASTY ANTERIOR APPROACH;  Surgeon: Mcarthur Rossetti, MD;  Location: [facility];  Service: Orthopedics;  Laterality: Right;    There were no vitals filed for this visit.   Subjective Assessment - 10/28/17 1615    Subjective  I feel like I am loosing my mobility because I am not doing much but sitting around. I think this started around August 2018 and I think it was when I started this chemo medicine. I am still taking a pill form of chemo. I  have had radiation.     Pertinent History  metastatic non-small cell lung cancer that was initially diagnosed as a stage IA status post stereotactic radiotherapy of the left upper lobe and now she presented with disease recurrence as well as mediastinal lymphadenopathy and liver metastasis and solitary brain metastasis. currently taking chemotherapy pill and has completed radiation., neuropathy, Rt. THR, history of PE    Patient Stated Goals  to be able to walk stronger, get rid of cane    Currently in Pain?  No/denies    Pain Score  0-No pain         [facility] PT Assessment - 10/28/17 0001      Assessment   Medical Diagnosis  stage 4 non small cell lung cancer    Referring Provider  Mikey Bussing    Onset Date/Surgical Date  08/21/15    Hand Dominance  Right    Prior Therapy  none      Precautions   Precautions  Other (comment) right hip replacement      Restrictions   Weight Bearing Restrictions  No      Balance Screen   Has the patient fallen in the past 6 months  Yes    How many times?  1 rushing around  and missed step 1 month ago    Has the patient had a decrease in activity level because of a fear of falling?   Yes    Is the patient reluctant to leave their home because of a fear of falling?   No      Home Environment   Living Environment  Private residence    Living Arrangements  Alone    Available Help at Discharge  Family    Type of [place] Access  Level entry    [place] - single point;Walker - 4 wheels      Prior Function   Level of Independence  Independent    Vocation  Retired    Leisure  pt is currently not exercising      Cognition   Overall Cognitive Status  Within Functional Limits for tasks assessed      Strength   Right Hip Flexion  3/5    Right Hip ABduction  4/5    Right Hip ADduction  3/5    Left Hip Flexion  3/5    Left Hip ABduction  4/5    Left Hip ADduction  3/5    Right Knee Flexion   3/5    Right Knee Extension  3+/5    Left Knee Flexion  3/5    Left Knee Extension  3+/5    Right Ankle Dorsiflexion  4/5    Left Ankle Dorsiflexion  2-/5      Ambulation/Gait   Gait Pattern  Step-to pattern;Decreased hip/knee flexion - right;Decreased hip/knee flexion - left;Decreased dorsiflexion - left;Decreased stride length      Timed Up and Go Test   Normal TUG (seconds)  25      Functional Gait  Assessment   Gait assessed   --             Objective measurements completed on examination: See above findings.                PT Long Term Goals - 10/28/17 1659      PT LONG TERM GOAL #1   Title  Pt will demonstrate 3+/5 left dorsiflexor strength to decrease risk of falls when ambulating.    Baseline  2-/5    Time  4    Period  Weeks    Status  New    Target Date  11/25/17      PT LONG TERM GOAL #2   Title  Pt will demonstrate 4/5 bilateral hip flexor strength to decrease risk of falls to to allow pt to ambulate with a more normalized gait pattern.    Baseline  3/5    Time  4    Period  Weeks    Status  New    Target Date  11/25/17      PT LONG TERM GOAL #3   Title  Pt will be able to don/doff socks and shoes independently without increased time    Baseline  pt states it is very difficult and takes 10-15 min    Time  4    Period  Weeks    Status  New    Target Date  11/25/17      PT LONG TERM GOAL #4   Title  Pt will be independent in a home exercise program for continued strengthening and stretching    Time  4    Period  Weeks    Status  New    Target Date  11/25/17      PT LONG TERM GOAL #5   Title  Pt will be able to complete TUG in less than 13 seconds to decrease risk of falls    Baseline  25 sec     Time  4    Period  Weeks    Status  New    Target Date  11/25/17             Plan - 10/28/17 1651    Clinical Impression Statement  Pt presents to PT with weakness, abnormal gait and decreased balance following treatments for  stage 4 non small cell lung cancer with mets to her brain and liver. Pt began using a straight cane in the last month for ambulation and is eager to not use any assistive device. She stated recently she has become more sedentary. Her gait is characterized by step to gait pattern with some strep through, decreased bilateral hip flexion, decreased knee flexion and decreased left ankle dorsiflexion. She has very limited dorsiflexion in her left ankle- strength 2-/5 compared with right which was 4/5. She is not sure why this is. She is fearful of falling especially at night when she gets up to use the restroom. She has difficulty donning her socks and shoes due to limited ROM. Current time to perform TUG test was 25 seconds. Anything over 13 seconds places her at a high fall risk. Pt would benefit from skilled PT services for LE strengthening, to improve quality of gait and improve balance to decrease fall risk.     History and Personal Factors relevant to plan of care:  mets to brain and liver, high fall risk, neuropathy    Clinical Presentation  Evolving    Clinical Presentation due to:  mets to brain and liver    Clinical Decision Making  Moderate    Rehab Potential  Good    Clinical Impairments Affecting Rehab Potential  pt currently taking chemo, hx of neuropathy bilateral feet    PT Frequency  2x / week    PT Duration  8 weeks    PT Treatment/Interventions  ADLs/Self Care Home Management;Therapeutic exercise;Therapeutic activities;Gait training;Balance training;Neuromuscular re-education;Patient/family education;Manual techniques;Taping;Passive range of motion    PT Next Visit Plan  BERG balance test, supine strengthening, gait and balance in parallel bars    Consulted and Agree with Plan of Care  Patient       Patient will benefit from skilled therapeutic intervention in order to improve the following deficits and impairments:  Abnormal gait, Decreased endurance, Decreased strength, Decreased  balance, Decreased mobility, Difficulty walking, Decreased range of motion, Postural dysfunction  Visit Diagnosis: Difficulty in walking, not elsewhere classified - Plan: PT plan of care cert/re-cert  Muscle weakness (generalized) - Plan: PT plan of care cert/re-cert  Abnormal posture - Plan: PT plan of care cert/re-cert     Problem List Patient Active Problem List   Diagnosis Date Noted  . Physical deconditioning 10/27/2017  . Encounter for antineoplastic chemotherapy 04/06/2017  . Goals of care, counseling/discussion 04/06/2017  . Brain metastases ([facility]) 04/06/2017  . Adenocarcinoma of left lung, stage 4 ([facility]) 03/18/2017  . Liver mass 03/09/2017  . Solitary pulmonary nodule 10/17/2015  . Tachycardia 06/19/2014  . Chronic anticoagulation 06/19/2014  . History of pulmonary embolism 06/19/2014  . Degenerative arthritis of hip 02/25/2013  . Hereditary and idiopathic peripheral neuropathy 01/05/2013  . Hip pain 01/05/2013  Allyson Sabal [facility] 10/28/2017, 5:03 PM  [place], [place], [address] Phone: [phone]   Fax:  [phone]  Name: JELESA MANGINI MRN: [id number] Date of Birth: Apr 22, 1928  Manus Gunning, PT 10/28/17 5:04 PM

## 2017-11-03 ENCOUNTER — Ambulatory Visit: Payer: Medicare Other | Admitting: Physical Therapy

## 2017-11-03 VITALS — BP 144/80 | HR 83

## 2017-11-03 DIAGNOSIS — R262 Difficulty in walking, not elsewhere classified: Secondary | ICD-10-CM | POA: Diagnosis not present

## 2017-11-03 DIAGNOSIS — M6281 Muscle weakness (generalized): Secondary | ICD-10-CM

## 2017-11-03 NOTE — Therapy (Signed)
[place], [place], [address] Phone: [phone]   Fax:  [phone]  Physical Therapy Treatment  Patient Details  Name: [name] MRN: [id number] Date of Birth: 1928/05/18 Referring Provider: Mikey Bussing   Encounter Date: 11/03/2017  PT End of Session - 11/03/17 1443    Visit Number  2    Number of Visits  9    Date for PT Re-Evaluation  11/25/17    PT Start Time  1300    PT Stop Time  1348    PT Time Calculation (min)  48 min    Activity Tolerance  Patient tolerated treatment well    Behavior During Therapy  [facility] for tasks assessed/performed       Past Medical History:  Diagnosis Date  . Adenocarcinoma of left lung, stage 4 ([place]) 03/18/2017  . Arthritis    Neck,shoulder  . Brain metastases ([place]) 04/06/2017  . Colonic polyp   . Difficulty sleeping   . Encounter for antineoplastic chemotherapy 04/06/2017  . History of radiation therapy 12/04/15-12/14/15   left upper lobe 60 Gy  . Hx of unilateral nephrectomy 1994   right   . Hyperlipemia   . Lung cancer ([place])   . Numbness in feet   . Peripheral neuropathy   . Personal history of PE (pulmonary embolism) 2011    Past Surgical History:  Procedure Laterality Date  . ABDOMINAL HYSTERECTOMY    . APPENDECTOMY    . cataracts Bilateral 09/2014   removal  . CESAREAN SECTION    . NEPHRECTOMY  1993   RT -   . TOTAL HIP ARTHROPLASTY Right 02/25/2013   Procedure: RIGHT TOTAL HIP ARTHROPLASTY ANTERIOR APPROACH;  Surgeon: Mcarthur Rossetti, MD;  Location: [facility];  Service: Orthopedics;  Laterality: Right;    Vitals:   11/03/17 1314  BP: (!) 144/80  Pulse: 83  SpO2: 96%    Subjective Assessment - 11/03/17 1310    Subjective  I've been paying attention since she said something, and this leg doesn't move as well as the other.  "I feel a little dizzy, but I was drinking coffee.  It went away."    Pertinent History  metastatic non-small  cell lung cancer that was initially diagnosed as a stage IA status post stereotactic radiotherapy of the left upper lobe and now she presented with disease recurrence as well as mediastinal lymphadenopathy and liver metastasis and solitary brain metastasis. currently taking chemotherapy pill and has completed radiation., neuropathy, Rt. THR, history of PE    Currently in Pain?  No/denies         [facility] PT Assessment - 11/03/17 0001      Berg Balance Test   Sit to Stand  Able to stand  independently using hands    Standing Unsupported  Able to stand safely 2 minutes    Sitting with Back Unsupported but Feet Supported on Floor or Stool  Able to sit safely and securely 2 minutes    Stand to Sit  Controls descent by using hands    Transfers  Able to transfer safely, minor use of hands    Standing Unsupported with Eyes Closed  Able to stand 10 seconds with supervision    Standing Ubsupported with Feet Together  Able to place feet together independently and stand 1 minute safely    From Standing, Reach Forward with Outstretched Arm  Can reach forward >12 cm safely (5")    From Standing Position, Pick  up Object from [place] to pick up shoe, needs supervision    From Standing Position, Turn to Look Behind Over each Shoulder  Looks behind from both sides and weight shifts well    Turn 360 Degrees  Able to turn 360 degrees safely but slowly    Standing Unsupported, Alternately Place Feet on Step/Stool  Needs assistance to keep from falling or unable to try    Standing Unsupported, One Foot in [place] to take small step independently and hold 30 seconds    Standing on One Leg  Tries to lift leg/unable to hold 3 seconds but remains standing independently    Total Score  40                  [facility] Adult PT Treatment/Exercise - 11/03/17 0001      Neuro Re-ed    Neuro Re-ed Details   Berg balance test done today.      Knee/Hip Exercises: Supine   Short Arc Quad Sets   AROM;Right;Left;10 reps    Bridges  AROM;Both;10 reps    Single Leg Bridge  AROM;Right;Left;5 reps      Ankle Exercises: Supine   T-Band  yellow, for dorsiflexion right and left x 10 each                  PT Long Term Goals - 10/28/17 1659      PT LONG TERM GOAL #1   Title  Pt will demonstrate 3+/5 left dorsiflexor strength to decrease risk of falls when ambulating.    Baseline  2-/5    Time  4    Period  Weeks    Status  New    Target Date  11/25/17      PT LONG TERM GOAL #2   Title  Pt will demonstrate 4/5 bilateral hip flexor strength to decrease risk of falls to to allow pt to ambulate with a more normalized gait pattern.    Baseline  3/5    Time  4    Period  Weeks    Status  New    Target Date  11/25/17      PT LONG TERM GOAL #3   Title  Pt will be able to don/doff socks and shoes independently without increased time    Baseline  pt states it is very difficult and takes 10-15 min    Time  4    Period  Weeks    Status  New    Target Date  11/25/17      PT LONG TERM GOAL #4   Title  Pt will be independent in a home exercise program for continued strengthening and stretching    Time  4    Period  Weeks    Status  New    Target Date  11/25/17      PT LONG TERM GOAL #5   Title  Pt will be able to complete TUG in less than 13 seconds to decrease risk of falls    Baseline  25 sec     Time  4    Period  Weeks    Status  New    Target Date  11/25/17            Plan - 11/03/17 1719    Clinical Impression Statement  Pt. scored 40 out of 56 on her Berg balance test today.  We began a LE strengthening program and she was challenged by  low level exercises.  She will benefit from beginning a home exercise program instruction at next visit.    Rehab Potential  Good    Clinical Impairments Affecting Rehab Potential  pt currently taking chemo, hx of neuropathy bilateral feet    PT Frequency  2x / week    PT Duration  8 weeks    PT Treatment/Interventions   ADLs/Self Care Home Management;Therapeutic exercise;Therapeutic activities;Gait training;Balance training;Neuromuscular re-education;Patient/family education;Manual techniques;Taping;Passive range of motion    PT Next Visit Plan  supine strengthening, gait and balance in parallel bars    Consulted and Agree with Plan of Care  Patient       Patient will benefit from skilled therapeutic intervention in order to improve the following deficits and impairments:  Abnormal gait, Decreased endurance, Decreased strength, Decreased balance, Decreased mobility, Difficulty walking, Decreased range of motion, Postural dysfunction  Visit Diagnosis: Difficulty in walking, not elsewhere classified  Muscle weakness (generalized)     Problem List Patient Active Problem List   Diagnosis Date Noted  . Physical deconditioning 10/27/2017  . Encounter for antineoplastic chemotherapy 04/06/2017  . Goals of care, counseling/discussion 04/06/2017  . Brain metastases ([place]) 04/06/2017  . Adenocarcinoma of left lung, stage 4 ([place]) 03/18/2017  . Liver mass 03/09/2017  . Solitary pulmonary nodule 10/17/2015  . Tachycardia 06/19/2014  . Chronic anticoagulation 06/19/2014  . History of pulmonary embolism 06/19/2014  . Degenerative arthritis of hip 02/25/2013  . Hereditary and idiopathic peripheral neuropathy 01/05/2013  . Hip pain 01/05/2013    Maliha Outten 11/03/2017, 5:22 PM  [place], [place], [address] Phone: [phone]   Fax:  [phone]  Name: [name] MRN: [id number] Date of Birth: 02-11-1928  Serafina Royals, PT 11/03/17 5:22 PM

## 2017-11-05 ENCOUNTER — Ambulatory Visit: Payer: Medicare Other

## 2017-11-05 DIAGNOSIS — R262 Difficulty in walking, not elsewhere classified: Secondary | ICD-10-CM | POA: Diagnosis not present

## 2017-11-05 DIAGNOSIS — R293 Abnormal posture: Secondary | ICD-10-CM

## 2017-11-05 DIAGNOSIS — M6281 Muscle weakness (generalized): Secondary | ICD-10-CM

## 2017-11-05 MED FILL — GILOTRIF 30 MG TABLET: 30 | 30 days supply | Qty: 30 | Fill #1

## 2017-11-05 NOTE — Patient Instructions (Addendum)
Gastroc Stretch    Stand with right foot back, leg straight, forward leg bent. Keeping heel on floor, turned slightly out, lean into wall until stretch is felt in calf. Hold __20__ seconds. Repeat __3-5__ times per set. Do __2-3__ sessions per day.  Hamstring Stretch    Inhale and straighten spine. Exhale and lean forward toward extended leg. Hold position for _20__ seconds. Inhale and come back to center. Repeat with other leg extended. Repeat _3-5__ times, alternating legs. Do _2-3__ times per day.  Long [employer]    Straighten operated leg and try to hold it __5__ seconds. Use __1-3__ lbs on ankle. Repeat __10__ times. Do _2___ sessions a day.   Seated Alternating Leg Raise (Marching)    Sit on chair. Raise bent knee and return. Repeat with other leg. Slow and controlled without leaning side to side.  Do __1-2_ sets of _10__ repetitions.  Place yellow theraband around thighs near knees and hold, then open/close knees against resistance of theraband keeping feet fixed to floor. Repeat 10-20 times.  Place pillow between knees and squeeze, holding 5 seconds, repeat 2-3 sets of 10 times.   BALANCE:  Heel Raise: Bilateral (Standing)     Stand near counter for fingertip support if needed. Rise on balls of feet.  Then also do toe raises.  Repeat __10-20__ times per set. Do _1-2___ sets per session. Do __2__ sessions per day.  SINGLE LIMB STANCE    Stand at counter (corner if you have one) for minimal arm support. Raise leg. Hold _10-20__ seconds. Repeat with other leg. Once this becomes easier, for increased challenge, close eyes with fingertips on counter for safety. _3-5__ reps per set, _2-3__ sets per day.  Tandem Stance    Stand at counter (corner if you have one). Right foot in front of left, heel touching toe both feet "straight ahead". Stand on Foot Triangle of Support with both feet. Balance in this position _10-20__ seconds. Do with left foot in front of  right. Once this becomes easier, for increased challenge, close eyes with fingertips on counter for safety.  Then at counter holding cane in other hand:  Can try:  Heel-toe walking like on a tightrope; walk on toes, walk on heels, and crossover walking to each side.  IF YOU DO NOT FEEL STEADY STOP!!  WALKING  Walking is a great form of exercise to increase your strength, endurance and overall fitness.  A walking program can help you start slowly and gradually build endurance as you go.  Everyone's ability is different, so each person's starting point will be different.  You do not have to follow them exactly.  The are just samples. You should simply find out what's right for you and stick to that program.   In the beginning, you'll start off walking 2-3 times a day for short distances.  As you get stronger, you'll be walking further at just 1-2 times per day.  A. You Can Walk For A Certain Length Of Time Each Day    Walk 5 minutes 3 times per day.  Increase 2 minutes every 2 days (3 times per day).  Work up to 25-30 minutes (1-2 times per day).   Example:   Day 1-2 5 minutes 3 times per day   Day 7-8 12 minutes 2-3 times per day   Day 13-14 25 minutes 1-2 times per day  B. You Can Walk For a Certain Distance Each Day     Distance can be substituted  for time.    Example:   3 trips to mailbox (at road)   3 trips to corner of block   3 trips around the block

## 2017-11-05 NOTE — Therapy (Signed)
[place], [place], [address] Phone: [phone]   Fax:  [phone]  Physical Therapy Treatment  Patient Details  Name: [name] MRN: [id number] Date of Birth: [birth year] Referring Provider: Mikey Bussing   Encounter Date: 11/05/2017  PT End of Session - 11/05/17 1151    Visit Number  3    Number of Visits  9    Date for PT Re-Evaluation  11/25/17    PT Start Time  1104    PT Stop Time  1152    PT Time Calculation (min)  48 min    Activity Tolerance  Patient tolerated treatment well    Behavior During Therapy  [facility] for tasks assessed/performed       Past Medical History:  Diagnosis Date  . Adenocarcinoma of left lung, stage 4 ([place]) 03/18/2017  . Arthritis    Neck,shoulder  . Brain metastases ([place]) 04/06/2017  . Colonic polyp   . Difficulty sleeping   . Encounter for antineoplastic chemotherapy 04/06/2017  . History of radiation therapy 12/04/15-12/14/15   left upper lobe 60 Gy  . Hx of unilateral nephrectomy 1994   right   . Hyperlipemia   . Lung cancer ([place])   . Numbness in feet   . Peripheral neuropathy   . Personal history of PE (pulmonary embolism) 2011    Past Surgical History:  Procedure Laterality Date  . ABDOMINAL HYSTERECTOMY    . APPENDECTOMY    . cataracts Bilateral 09/2014   removal  . CESAREAN SECTION    . NEPHRECTOMY  1993   RT -   . TOTAL HIP ARTHROPLASTY Right 02/25/2013   Procedure: RIGHT TOTAL HIP ARTHROPLASTY ANTERIOR APPROACH;  Surgeon: Mcarthur Rossetti, MD;  Location: [facility];  Service: Orthopedics;  Laterality: Right;    There were no vitals filed for this visit.  Subjective Assessment - 11/05/17 1104    Subjective  I felt really good after last visit. It really surprised me that I couldn't lift my leg to alternate placing them on the step so I worked on that some at home too.     Pertinent History  metastatic non-small cell lung cancer that was  initially diagnosed as a stage IA status post stereotactic radiotherapy of the left upper lobe and now she presented with disease recurrence as well as mediastinal lymphadenopathy and liver metastasis and solitary brain metastasis. currently taking chemotherapy pill and has completed radiation., neuropathy, Rt. THR, history of PE    Patient Stated Goals  to be able to walk stronger, get rid of cane    Currently in Pain?  No/denies                      [facility] Adult PT Treatment/Exercise - 11/05/17 0001      Self-Care   Self-Care  Other Self-Care Comments    Other Self-Care Comments   Briefly educated pt on importance of beginning and staying consistent with a daily walking program. Can begin by walking around her house multiple times/day and trying to increase how long she is able to walk for each time and to keep a log to help her chart her progress along the way. Issued handout for this as well.       Neuro Re-ed    Neuro Re-ed Details   In // bars: Heel-toe front and retro; high knee marching; bil crossover in front; toe walking, then heel walking (both were very challenging for  pt) 2 times of each. Seated rest then finished with step up onto blue oval 10x each with 3 sec holds.       Knee/Hip Exercises: Stretches   Passive Hamstring Stretch  Right;Left;2 reps;20 seconds    Gastroc Stretch  Right;Left;2 reps;20 seconds Holding back of bike for +2 HHA      Knee/Hip Exercises: Seated   Long Arc Quad  Strengthening;Right;Left;10 reps;Weights 5 sec holds    Ball Squeeze  2 x 10 with 5 sec holds squeezing bolster.    Clamshell with TheraBand  Yellow 10 times    Marching  Strengthening;Right;Left;10 reps VCs for slow and controlled             PT Education - 11/05/17 1150    Education provided  Yes    Education Details  Seated bil LE strength and balance activities    Person(s) Educated  Patient    Methods  Explanation;Demonstration;Handout    Comprehension  Verbalized  understanding;Returned demonstration;Need further instruction          PT Long Term Goals - 10/28/17 1659      PT LONG TERM GOAL #1   Title  Pt will demonstrate 3+/5 left dorsiflexor strength to decrease risk of falls when ambulating.    Baseline  2-/5    Time  4    Period  Weeks    Status  New    Target Date  11/25/17      PT LONG TERM GOAL #2   Title  Pt will demonstrate 4/5 bilateral hip flexor strength to decrease risk of falls to to allow pt to ambulate with a more normalized gait pattern.    Baseline  3/5    Time  4    Period  Weeks    Status  New    Target Date  11/25/17      PT LONG TERM GOAL #3   Title  Pt will be able to don/doff socks and shoes independently without increased time    Baseline  pt states it is very difficult and takes 10-15 min    Time  4    Period  Weeks    Status  New    Target Date  11/25/17      PT LONG TERM GOAL #4   Title  Pt will be independent in a home exercise program for continued strengthening and stretching    Time  4    Period  Weeks    Status  New    Target Date  11/25/17      PT LONG TERM GOAL #5   Title  Pt will be able to complete TUG in less than 13 seconds to decrease risk of falls    Baseline  25 sec     Time  4    Period  Weeks    Status  New    Target Date  11/25/17            Plan - 11/05/17 1206    Clinical Impression Statement  Progressed pt today with standing balance activities and seated bil LE strength/flexibility exercises. She tolerated all this very well so added this to HEP. Instructed pt to only do balance activities she feels safe doing at home as she lives alone and pt verbalized understanding this.     Rehab Potential  Good    Clinical Impairments Affecting Rehab Potential  pt currently taking chemo, hx of neuropathy bilateral feet    PT Frequency  2x / week    PT Duration  8 weeks    PT Treatment/Interventions  ADLs/Self Care Home Management;Therapeutic exercise;Therapeutic activities;Gait  training;Balance training;Neuromuscular re-education;Patient/family education;Manual techniques;Taping;Passive range of motion    PT Next Visit Plan  Review HEP and cont bil LE strengthening and flexibility, gait and balance in parallel bars    Consulted and Agree with Plan of Care  Patient       Patient will benefit from skilled therapeutic intervention in order to improve the following deficits and impairments:  Abnormal gait, Decreased endurance, Decreased strength, Decreased balance, Decreased mobility, Difficulty walking, Decreased range of motion, Postural dysfunction  Visit Diagnosis: Difficulty in walking, not elsewhere classified  Muscle weakness (generalized)  Abnormal posture     Problem List Patient Active Problem List   Diagnosis Date Noted  . Physical deconditioning 10/27/2017  . Encounter for antineoplastic chemotherapy 04/06/2017  . Goals of care, counseling/discussion 04/06/2017  . Brain metastases ([place]) 04/06/2017  . Adenocarcinoma of left lung, stage 4 ([place]) 03/18/2017  . Liver mass 03/09/2017  . Solitary pulmonary nodule 10/17/2015  . Tachycardia 06/19/2014  . Chronic anticoagulation 06/19/2014  . History of pulmonary embolism 06/19/2014  . Degenerative arthritis of hip 02/25/2013  . Hereditary and idiopathic peripheral neuropathy 01/05/2013  . Hip pain 01/05/2013    Otelia Limes, PTA 11/05/2017, 12:10 PM  [place], [place], [address] Phone: [phone]   Fax:  [phone]  Name: [name] MRN: [id number] Date of Birth: Oct 23, 1927

## 2017-11-09 ENCOUNTER — Telehealth: Payer: Self-pay | Admitting: Internal Medicine

## 2017-11-09 ENCOUNTER — Inpatient Hospital Stay: Payer: Medicare Other

## 2017-11-09 ENCOUNTER — Inpatient Hospital Stay: Payer: Medicare Other | Admitting: Nutrition

## 2017-11-09 ENCOUNTER — Encounter: Payer: Self-pay | Admitting: Oncology

## 2017-11-09 ENCOUNTER — Inpatient Hospital Stay (HOSPITAL_BASED_OUTPATIENT_CLINIC_OR_DEPARTMENT_OTHER): Payer: Medicare Other | Admitting: Oncology

## 2017-11-09 VITALS — BP 147/76 | HR 81 | Temp 97.5°F | Resp 20 | Ht 67.0 in | Wt 163.3 lb

## 2017-11-09 DIAGNOSIS — Z5111 Encounter for antineoplastic chemotherapy: Secondary | ICD-10-CM

## 2017-11-09 DIAGNOSIS — C3492 Malignant neoplasm of unspecified part of left bronchus or lung: Secondary | ICD-10-CM

## 2017-11-09 DIAGNOSIS — C7931 Secondary malignant neoplasm of brain: Secondary | ICD-10-CM | POA: Diagnosis not present

## 2017-11-09 DIAGNOSIS — R634 Abnormal weight loss: Secondary | ICD-10-CM

## 2017-11-09 DIAGNOSIS — C787 Secondary malignant neoplasm of liver and intrahepatic bile duct: Secondary | ICD-10-CM | POA: Diagnosis not present

## 2017-11-09 DIAGNOSIS — C3412 Malignant neoplasm of upper lobe, left bronchus or lung: Secondary | ICD-10-CM | POA: Diagnosis not present

## 2017-11-09 LAB — CBC WITH DIFFERENTIAL (CANCER CENTER ONLY)
Basophils Absolute: 0 10*3/uL (ref 0.0–0.1)
Basophils Relative: 1 %
EOS ABS: 0.4 10*3/uL (ref 0.0–0.5)
Eosinophils Relative: 6 %
HEMATOCRIT: 39.9 % (ref 34.8–46.6)
HEMOGLOBIN: 13.3 g/dL (ref 11.6–15.9)
LYMPHS ABS: 2 10*3/uL (ref 0.9–3.3)
LYMPHS PCT: 31 %
MCH: 30.9 pg (ref 25.1–34.0)
MCHC: 33.3 g/dL (ref 31.5–36.0)
MCV: 92.8 fL (ref 79.5–101.0)
MONOS PCT: 11 %
Monocytes Absolute: 0.7 10*3/uL (ref 0.1–0.9)
Neutro Abs: 3.3 10*3/uL (ref 1.5–6.5)
Neutrophils Relative %: 51 %
Platelet Count: 196 10*3/uL (ref 145–400)
RBC: 4.3 MIL/uL (ref 3.70–5.45)
RDW: 14.7 % (ref 11.2–16.1)
WBC Count: 6.3 10*3/uL (ref 3.9–10.3)

## 2017-11-09 LAB — CMP (CANCER CENTER ONLY)
ALK PHOS: 369 U/L — AB (ref 40–150)
ALT: 32 U/L (ref 0–55)
ANION GAP: 8 (ref 3–11)
AST: 40 U/L — ABNORMAL HIGH (ref 5–34)
Albumin: 3.4 g/dL — ABNORMAL LOW (ref 3.5–5.0)
BUN: 18 mg/dL (ref 7–26)
CALCIUM: 9.3 mg/dL (ref 8.4–10.4)
CO2: 26 mmol/L (ref 22–29)
Chloride: 104 mmol/L (ref 98–109)
Creatinine: 1.14 mg/dL — ABNORMAL HIGH (ref 0.60–1.10)
GFR, EST AFRICAN AMERICAN: 49 mL/min — AB (ref >60)
GFR, EST NON AFRICAN AMERICAN: 42 mL/min — AB (ref >60)
Glucose, Bld: 88 mg/dL (ref 70–140)
Potassium: 4.5 mmol/L (ref 3.3–4.7)
Sodium: 138 mmol/L (ref 136–145)
Total Bilirubin: 0.6 mg/dL (ref 0.2–1.2)
Total Protein: 7.7 g/dL (ref 6.4–8.3)

## 2017-11-09 NOTE — Assessment & Plan Note (Signed)
This is a very pleasant 84 year old African-American female with metastatic non-small cell lung cancer that was initially diagnosed as a stage IA status post stereotactic radiotherapy of the left upper lobe and now she presented with disease recurrence as well as mediastinal lymphadenopathy and liver metastasis and solitary brain metastasis. 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 She underwent stereotactic radiotherapy to a solitary brain metastasis. She was treated with Tagrisso 80 mg by mouth daily status post 4 months. She also received Avastin 15 MG/KG every 3 weeks is status post 3 cycles.This treatment was discontinued secondary to disease progression. 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 The patient is now on Gilotrif 30 mg daily.  Status post 1 month of treatment and tolerating the treatment well overall. Recommend that she continue her treatment with Gilotrif. She will have a restaging CT scan in approximately 1 month. She will have a return visit 1-2 days after the scan to discuss the results.  For being off balance, deconditioning, and gait she will continue physical therapy.  For her weight loss, she will meet with our dietitian later today.  She is beginning to gain back some of her lost weight.  She was advised to call immediately if she has any concerning symptoms in the interval. The patient voices understanding of current disease status and treatment options and is in agreement with the current care plan. All questions were answered. The patient knows to call the clinic with any problems, questions or concerns. We can certainly see the patient much sooner if necessary.

## 2017-11-09 NOTE — Progress Notes (Signed)
Nutrition follow-up completed with patient with metastatic lung cancer. Patient reports she has improved since last visit January 8. Her appetite has improved and her oral intake has increased. We also increased to 163.3 pounds from 160 pounds on January 8. Patient has no complaints today.  Nutrition diagnosis: Inadequate oral intake has improved.  Intervention: Educated patient on increasing calories and protein throughout the day to support weight maintenance. Provided additional samples and coupons. Questions answered and teach back method used. Contact information was provided.  Monitoring, evaluation, goals: Patient will tolerate adequate calories and protein for weight maintenance.  No follow-up scheduled.  Patient will call me with questions or concerns.  **Disclaimer: This note was dictated with voice recognition software. Similar sounding words can inadvertently be transcribed and this note may contain transcription errors which may not have been corrected upon publication of note.**

## 2017-11-09 NOTE — Telephone Encounter (Signed)
Gave avs and calendar for february °

## 2017-11-09 NOTE — Progress Notes (Signed)
[facility] OFFICE PROGRESS NOTE  Derinda Late, MD [place] [address]  DIAGNOSIS: Stage IV (T1a, N2, M1b) non-small cell lung cancer, adenocarcinoma with positive EGFR mutation with deletion in exon 19 diagnosed initially in January 2017, with metastatic disease in May 2018 based on liver biopsy.  Repeat Foundation One test on 09/17/17 Biomarker Findings Microsatellite Status - MS-Stable Tumor Mutational Burden - TMB-Low (5 Muts/Mb) Genomic Findings For a complete list of the genes assayed, please refer to the Appendix. EGFR S768I, V769L CDKN2A/B loss MTAP loss exons 6-8 NFKBIA amplification NKX2-1 amplification TP53 R337L 7 Disease relevant genes with no reportable alterations: KRAS, ALK, BRAF, MET, RET, ERBB2, ROS1  PRIOR THERAPY: 1) Stereotactic body radiotherapy to the left upper lobe lung nodule under the care of Dr. Pablo Ledger without biopsy completed 12/14/2015. 2) stereotactic radiotherapy to a solitary brain metastasis under the care of Dr. Lisbeth Renshaw on 04/21/2017. 3) Tagrisso 80 mg by mouth daily, first dose started 04/09/2017. 4)Tagrisso 80 mg by mouth daily and Avastin 15 MG/KG every 3 weeks was added starting 07/09/2017 secondary to disease progression on single agent Tagrisso. Status post 3 cycles with Avastin. Last dose was given August 20, 2017 this was discontinued secondary to disease progression.  CURRENT THERAPY: GILOTRIF 30 mg p.o. 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  First dose 10/12/2017.  Status post 1 month of treatment.  INTERVAL HISTORY: [name] 84 y.o. female returns for routine follow-up visit accompanied by her son.  The patient is feeling fine today and has no specific complaints except for mild fatigue.  She denies fevers and chills.  Denies chest pain, shortness breath, cough, hemoptysis.  Denies nausea, vomiting, constipation, diarrhea.  Denies skin rashes.  Her appetite is improving and she  is gaining back some of her lost weight.  She is working with physical therapy for strengthening.  She feels though it is helping and she is getting stronger.  The patient is here for evaluation and repeat blood work.  MEDICAL HISTORY: Past Medical History:  Diagnosis Date  . Adenocarcinoma of left lung, stage 4 ([facility]) 03/18/2017  . Arthritis    Neck,shoulder  . Brain metastases ([facility]) 04/06/2017  . Colonic polyp   . Difficulty sleeping   . Encounter for antineoplastic chemotherapy 04/06/2017  . History of radiation therapy 12/04/15-12/14/15   left upper lobe 60 Gy  . Hx of unilateral nephrectomy 1994   right   . Hyperlipemia   . Lung cancer ([facility])   . Numbness in feet   . Peripheral neuropathy   . Personal history of PE (pulmonary embolism) 2011    ALLERGIES:  is allergic to penicillins.  MEDICATIONS:  Current Outpatient Medications  Medication Sig Dispense Refill  . afatinib dimaleate (GILOTRIF) 30 MG tablet Take 1 tablet (30 mg total) by mouth daily. Take on an empty stomach 1hr before or 2hrs after meals. 30 tablet 2  . CALCIUM CARBONATE PO Take 1 tablet by mouth daily.    . celecoxib (CELEBREX) 200 MG capsule take 1 capsule by mouth once daily if needed for NECK PAIN OR ARTHRITIS  0  . gabapentin (NEURONTIN) 100 MG capsule 3 tablets at bedtime    . metoprolol succinate (TOPROL-XL) 25 MG 24 hr tablet Take 0.5 tablets (12.5 mg total) by mouth daily. 45 tablet 3  . Multiple Vitamin (MULTIVITAMIN WITH MINERALS) TABS Take 1 tablet by mouth daily.    . prochlorperazine (COMPAZINE) 10 MG tablet Take 1 tablet (10 mg  total) by mouth every 6 (six) hours as needed for nausea or vomiting. 30 tablet 0  . rivaroxaban (XARELTO) 20 MG TABS tablet Take 20 mg by mouth daily with supper.     No current facility-administered medications for this visit.     SURGICAL HISTORY:  Past Surgical History:  Procedure Laterality Date  . ABDOMINAL HYSTERECTOMY    . APPENDECTOMY    . cataracts Bilateral  09/2014   removal  . CESAREAN SECTION    . NEPHRECTOMY  1993   RT -   . TOTAL HIP ARTHROPLASTY Right 02/25/2013   Procedure: RIGHT TOTAL HIP ARTHROPLASTY ANTERIOR APPROACH;  Surgeon: Mcarthur Rossetti, MD;  Location: [facility];  Service: Orthopedics;  Laterality: Right;    REVIEW OF SYSTEMS:   Review of Systems  Constitutional: Negative for appetite change, chills, fever and unexpected weight change. Positive for mild fatigue. HENT:   Negative for mouth sores, nosebleeds, sore throat and trouble swallowing.   Eyes: Negative for eye problems and icterus.  Respiratory: Negative for cough, hemoptysis, shortness of breath and wheezing.   Cardiovascular: Negative for chest pain and leg swelling.  Gastrointestinal: Negative for abdominal pain, constipation, diarrhea, nausea and vomiting.  Genitourinary: Negative for bladder incontinence, difficulty urinating, dysuria, frequency and hematuria.   Musculoskeletal: Negative for back pain, gait problem, neck pain and neck stiffness.  Skin: Negative for itching and rash.  Neurological: Negative for dizziness, extremity weakness, gait problem, headaches, light-headedness and seizures.  Hematological: Negative for adenopathy. Does not bruise/bleed easily.  Psychiatric/Behavioral: Negative for confusion, depression and sleep disturbance. The patient is not nervous/anxious.     PHYSICAL EXAMINATION:  Blood pressure (!) 147/76, pulse 81, temperature (!) 97.5 F (36.4 C), temperature source Oral, resp. rate 20, height '5\' 7"'  (1.702 m), weight 163 lb 4.8 oz (74.1 kg), SpO2 100 %.  ECOG PERFORMANCE STATUS: 1 - Symptomatic but completely ambulatory  Physical Exam  Constitutional: Oriented to person, place, and time and well-developed, well-nourished, and in no distress. No distress.  HENT:  Head: Normocephalic and atraumatic.  Mouth/Throat: Oropharynx is clear and moist. No oropharyngeal exudate.  Eyes: Conjunctivae are normal. Right eye exhibits no  discharge. Left eye exhibits no discharge. No scleral icterus.  Neck: Normal range of motion. Neck supple.  Cardiovascular: Normal rate, regular rhythm, normal heart sounds and intact distal pulses.   Pulmonary/Chest: Effort normal and breath sounds normal. No respiratory distress. No wheezes. No rales.  Abdominal: Soft. Bowel sounds are normal. Exhibits no distension and no mass. There is no tenderness.  Musculoskeletal: Normal range of motion. Exhibits no edema.  Lymphadenopathy:    No cervical adenopathy.  Neurological: Alert and oriented to person, place, and time. Exhibits normal muscle tone. Gait normal. Coordination normal.  Skin: Skin is warm and dry. No rash noted. Not diaphoretic. No erythema. No pallor.  Psychiatric: Mood, memory and judgment normal.  Vitals reviewed.  LABORATORY DATA: Lab Results  Component Value Date   WBC 6.3 11/09/2017   HGB 13.6 10/27/2017   HCT 39.9 11/09/2017   MCV 92.8 11/09/2017   PLT 196 11/09/2017      Chemistry      Component Value Date/Time   NA 138 11/09/2017 1251   NA 138 10/08/2017 0916   K 4.5 11/09/2017 1251   K 4.3 10/08/2017 0916   CL 104 11/09/2017 1251   CO2 26 11/09/2017 1251   CO2 24 10/08/2017 0916   BUN 18 11/09/2017 1251   BUN 16.7 10/08/2017 0916  CREATININE 1.15 (H) 10/27/2017 0851   CREATININE 1.1 10/08/2017 0916      Component Value Date/Time   CALCIUM 9.3 11/09/2017 1251   CALCIUM 9.0 10/08/2017 0916   ALKPHOS 369 (H) 11/09/2017 1251   ALKPHOS 339 (H) 10/08/2017 0916   AST 40 (H) 11/09/2017 1251   AST 55 (H) 10/08/2017 0916   ALT 32 11/09/2017 1251   ALT 39 10/08/2017 0916   BILITOT 0.6 11/09/2017 1251   BILITOT 0.99 10/08/2017 0916       RADIOGRAPHIC STUDIES:  No results found.   ASSESSMENT/PLAN:  Adenocarcinoma of left lung, stage 4 (HCC) This is a very pleasant 84 year old African-American female with metastatic non-small cell lung cancer that was initially diagnosed as a stage IA status post  stereotactic radiotherapy of the left upper lobe and now she presented with disease recurrence as well as mediastinal lymphadenopathy and liver metastasis and solitary brain metastasis. 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 She underwent stereotactic radiotherapy to a solitary brain metastasis. She was treated with Tagrisso 80 mg by mouth daily status post 4 months. She also received Avastin 15 MG/KG every 3 weeks is status post 3 cycles.This treatment was discontinued secondary to disease progression. 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 The patient is now on Gilotrif 30 mg daily.  Status post 1 month of treatment and tolerating the treatment well overall. Recommend that she continue her treatment with Gilotrif. She will have a restaging CT scan in approximately 1 month. She will have a return visit 1-2 days after the scan to discuss the results.  For being off balance, deconditioning, and gait she will continue physical therapy.  For her weight loss, she will meet with our dietitian later today.  She is beginning to gain back some of her lost weight.  She was advised to call immediately if she has any concerning symptoms in the interval. The patient voices understanding of current disease status and treatment options and is in agreement with the current care plan. All questions were answered. The patient knows to call the clinic with any problems, questions or concerns. We can certainly see the patient much sooner if necessary.  Orders Placed This Encounter  Procedures  . CT ABDOMEN PELVIS W CONTRAST    Standing Status:   Future    Standing Expiration Date:   11/09/2018    Order Specific Question:   If indicated for the ordered procedure, I authorize the administration of contrast media per Radiology protocol    Answer:   Yes    Order Specific Question:   Preferred imaging location?     Answer:   [facility]    Order Specific Question:   Radiology Contrast Protocol - do NOT remove file path    Answer:   [email or web address]    Order Specific Question:   Reason for Exam additional comments    Answer:   lung cancer. Restaging.  . CT CHEST W CONTRAST    Standing Status:   Future    Standing Expiration Date:   11/09/2018    Order Specific Question:   If indicated for the ordered procedure, I authorize the administration of contrast media per Radiology protocol    Answer:   Yes    Order Specific Question:   Preferred imaging location?    Answer:   [facility]    Order Specific Question:   Radiology Contrast  Protocol - do NOT remove file path    Answer:   [email or web address]    Order Specific Question:   Reason for Exam additional comments    Answer:   lung cancer. Restaging.  Marland Kitchen CBC with Differential (Cancer Center Only)    Standing Status:   Future    Standing Expiration Date:   11/09/2018  . CMP ([facility] only)    Standing Status:   Future    Standing Expiration Date:   11/09/2018    Mikey Bussing, DNP, AGPCNP-BC, AOCNP 11/09/17

## 2017-11-11 ENCOUNTER — Other Ambulatory Visit: Payer: Self-pay

## 2017-11-11 ENCOUNTER — Emergency Department (HOSPITAL_COMMUNITY): Payer: Medicare Other

## 2017-11-11 ENCOUNTER — Encounter (HOSPITAL_COMMUNITY): Payer: Self-pay | Admitting: Emergency Medicine

## 2017-11-11 ENCOUNTER — Ambulatory Visit: Payer: Medicare Other

## 2017-11-11 ENCOUNTER — Inpatient Hospital Stay (HOSPITAL_COMMUNITY)
Admission: EM | Admit: 2017-11-11 | Discharge: 2017-11-13 | DRG: 065 | Disposition: A | Discharge status: Home Health | Payer: Medicare Other | Attending: Family Medicine | Admitting: Family Medicine

## 2017-11-11 DIAGNOSIS — Z923 Personal history of irradiation: Secondary | ICD-10-CM

## 2017-11-11 DIAGNOSIS — Z86010 Personal history of colonic polyps: Secondary | ICD-10-CM | POA: Diagnosis not present

## 2017-11-11 DIAGNOSIS — R471 Dysarthria and anarthria: Secondary | ICD-10-CM | POA: Diagnosis present

## 2017-11-11 DIAGNOSIS — I639 Cerebral infarction, unspecified: Secondary | ICD-10-CM | POA: Diagnosis not present

## 2017-11-11 DIAGNOSIS — Z79899 Other long term (current) drug therapy: Secondary | ICD-10-CM

## 2017-11-11 DIAGNOSIS — Z880 Allergy status to penicillin: Secondary | ICD-10-CM | POA: Diagnosis not present

## 2017-11-11 DIAGNOSIS — R4701 Aphasia: Secondary | ICD-10-CM | POA: Diagnosis present

## 2017-11-11 DIAGNOSIS — N3000 Acute cystitis without hematuria: Secondary | ICD-10-CM

## 2017-11-11 DIAGNOSIS — Z8249 Family history of ischemic heart disease and other diseases of the circulatory system: Secondary | ICD-10-CM

## 2017-11-11 DIAGNOSIS — Z86711 Personal history of pulmonary embolism: Secondary | ICD-10-CM | POA: Diagnosis present

## 2017-11-11 DIAGNOSIS — Z905 Acquired absence of kidney: Secondary | ICD-10-CM | POA: Diagnosis not present

## 2017-11-11 DIAGNOSIS — Z96641 Presence of right artificial hip joint: Secondary | ICD-10-CM | POA: Diagnosis present

## 2017-11-11 DIAGNOSIS — R29702 NIHSS score 2: Secondary | ICD-10-CM | POA: Diagnosis not present

## 2017-11-11 DIAGNOSIS — Z90710 Acquired absence of both cervix and uterus: Secondary | ICD-10-CM | POA: Diagnosis not present

## 2017-11-11 DIAGNOSIS — C3490 Malignant neoplasm of unspecified part of unspecified bronchus or lung: Secondary | ICD-10-CM

## 2017-11-11 DIAGNOSIS — E785 Hyperlipidemia, unspecified: Secondary | ICD-10-CM | POA: Diagnosis present

## 2017-11-11 DIAGNOSIS — Z7901 Long term (current) use of anticoagulants: Secondary | ICD-10-CM

## 2017-11-11 DIAGNOSIS — G609 Hereditary and idiopathic neuropathy, unspecified: Secondary | ICD-10-CM | POA: Diagnosis present

## 2017-11-11 DIAGNOSIS — C3492 Malignant neoplasm of unspecified part of left bronchus or lung: Secondary | ICD-10-CM | POA: Diagnosis present

## 2017-11-11 DIAGNOSIS — G629 Polyneuropathy, unspecified: Secondary | ICD-10-CM | POA: Diagnosis present

## 2017-11-11 DIAGNOSIS — N390 Urinary tract infection, site not specified: Secondary | ICD-10-CM | POA: Diagnosis present

## 2017-11-11 DIAGNOSIS — M1990 Unspecified osteoarthritis, unspecified site: Secondary | ICD-10-CM | POA: Diagnosis present

## 2017-11-11 DIAGNOSIS — R262 Difficulty in walking, not elsewhere classified: Secondary | ICD-10-CM

## 2017-11-11 DIAGNOSIS — M19019 Primary osteoarthritis, unspecified shoulder: Secondary | ICD-10-CM | POA: Diagnosis present

## 2017-11-11 DIAGNOSIS — Z9841 Cataract extraction status, right eye: Secondary | ICD-10-CM

## 2017-11-11 DIAGNOSIS — M6281 Muscle weakness (generalized): Secondary | ICD-10-CM

## 2017-11-11 DIAGNOSIS — I10 Essential (primary) hypertension: Secondary | ICD-10-CM | POA: Diagnosis present

## 2017-11-11 DIAGNOSIS — Z87891 Personal history of nicotine dependence: Secondary | ICD-10-CM | POA: Diagnosis not present

## 2017-11-11 DIAGNOSIS — Z833 Family history of diabetes mellitus: Secondary | ICD-10-CM

## 2017-11-11 DIAGNOSIS — D6859 Other primary thrombophilia: Secondary | ICD-10-CM | POA: Diagnosis present

## 2017-11-11 DIAGNOSIS — Z800 Family history of malignant neoplasm of digestive organs: Secondary | ICD-10-CM

## 2017-11-11 DIAGNOSIS — I6523 Occlusion and stenosis of bilateral carotid arteries: Secondary | ICD-10-CM | POA: Diagnosis present

## 2017-11-11 DIAGNOSIS — E440 Moderate protein-calorie malnutrition: Secondary | ICD-10-CM

## 2017-11-11 DIAGNOSIS — C7931 Secondary malignant neoplasm of brain: Secondary | ICD-10-CM | POA: Diagnosis present

## 2017-11-11 DIAGNOSIS — R29701 NIHSS score 1: Secondary | ICD-10-CM | POA: Diagnosis present

## 2017-11-11 DIAGNOSIS — Z9842 Cataract extraction status, left eye: Secondary | ICD-10-CM

## 2017-11-11 DIAGNOSIS — R293 Abnormal posture: Secondary | ICD-10-CM

## 2017-11-11 DIAGNOSIS — G479 Sleep disorder, unspecified: Secondary | ICD-10-CM | POA: Diagnosis present

## 2017-11-11 DIAGNOSIS — Z9221 Personal history of antineoplastic chemotherapy: Secondary | ICD-10-CM | POA: Diagnosis not present

## 2017-11-11 DIAGNOSIS — I6389 Other cerebral infarction: Secondary | ICD-10-CM | POA: Diagnosis not present

## 2017-11-11 LAB — URINALYSIS, ROUTINE W REFLEX MICROSCOPIC
Bilirubin Urine: NEGATIVE
GLUCOSE, UA: NEGATIVE mg/dL
KETONES UR: NEGATIVE mg/dL
Nitrite: POSITIVE — AB
PH: 5 (ref 5.0–8.0)
Protein, ur: NEGATIVE mg/dL
SQUAMOUS EPITHELIAL / LPF: NONE SEEN
Specific Gravity, Urine: 1.008 (ref 1.005–1.030)

## 2017-11-11 LAB — CBC WITH DIFFERENTIAL/PLATELET
BASOS ABS: 0 10*3/uL (ref 0.0–0.1)
Basophils Relative: 0 %
Eosinophils Absolute: 0.3 10*3/uL (ref 0.0–0.7)
Eosinophils Relative: 5 %
HEMATOCRIT: 40 % (ref 36.0–46.0)
HEMOGLOBIN: 13.9 g/dL (ref 12.0–15.0)
Lymphocytes Relative: 25 %
Lymphs Abs: 1.7 10*3/uL (ref 0.7–4.0)
MCH: 31.2 pg (ref 26.0–34.0)
MCHC: 34.8 g/dL (ref 30.0–36.0)
MCV: 89.9 fL (ref 78.0–100.0)
MONO ABS: 0.7 10*3/uL (ref 0.1–1.0)
Monocytes Relative: 11 %
NEUTROS ABS: 4.1 10*3/uL (ref 1.7–7.7)
NEUTROS PCT: 59 %
Platelets: 200 10*3/uL (ref 150–400)
RBC: 4.45 MIL/uL (ref 3.87–5.11)
RDW: 14.4 % (ref 11.5–15.5)
WBC: 6.9 10*3/uL (ref 4.0–10.5)

## 2017-11-11 LAB — CBG MONITORING, ED: GLUCOSE-CAPILLARY: 114 mg/dL — AB (ref 65–99)

## 2017-11-11 LAB — COMPREHENSIVE METABOLIC PANEL
ALBUMIN: 3.6 g/dL (ref 3.5–5.0)
ALK PHOS: 371 U/L — AB (ref 38–126)
ALT: 35 U/L (ref 14–54)
AST: 45 U/L — AB (ref 15–41)
Anion gap: 9 (ref 5–15)
BILIRUBIN TOTAL: 0.7 mg/dL (ref 0.3–1.2)
BUN: 20 mg/dL (ref 6–20)
CALCIUM: 9.3 mg/dL (ref 8.9–10.3)
CO2: 25 mmol/L (ref 22–32)
CREATININE: 1.04 mg/dL — AB (ref 0.44–1.00)
Chloride: 103 mmol/L (ref 101–111)
GFR calc Af Amer: 54 mL/min — ABNORMAL LOW (ref >60)
GFR, EST NON AFRICAN AMERICAN: 47 mL/min — AB (ref >60)
GLUCOSE: 112 mg/dL — AB (ref 65–99)
Potassium: 3.9 mmol/L (ref 3.5–5.1)
Sodium: 137 mmol/L (ref 135–145)
TOTAL PROTEIN: 7.8 g/dL (ref 6.5–8.1)

## 2017-11-11 MED ORDER — GADOBENATE DIMEGLUMINE 529 MG/ML IV SOLN
7.0000 mL | Freq: Once | INTRAVENOUS | Status: AC | PRN
  Administered 2017-11-11: 7 mL via INTRAVENOUS

## 2017-11-11 NOTE — ED Notes (Signed)
U/A per MD order. Pt advised to call for assistance PRN when ready to use restroom/provide sample. [employer]

## 2017-11-11 NOTE — ED Notes (Signed)
Bed: XT06 Expected date:  Expected time:  Means of arrival:  Comments: Weakness since 11am

## 2017-11-11 NOTE — ED Provider Notes (Signed)
  Face-to-face evaluation   History: Patient presents because she feels confused and having difficulty "finding words, " since early today.  Physical exam: Alert elderly female, cooperative, answers questions, occasionally has difficulty following through with thoughts.  She seems confused.  There is no dysarthria.  No respiratory distress.  Lungs clear.  Heart regular rate and rhythm.  No pronator drift.  No ataxia.  Medical screening examination/treatment/procedure(s) were conducted as a shared visit with non-physician practitioner(s) and myself.  I personally evaluated the patient during the encounter    Daleen Bo, MD 11/11/17 2345

## 2017-11-11 NOTE — ED Provider Notes (Signed)
[facility] DEPT Provider Note   CSN: [id number] Arrival date & time: 11/11/17  1911     History   Chief Complaint Chief Complaint  Patient presents with  . Altered Mental Status    HPI [name] is a 84 y.o. female with a past medical history of adenocarcinoma of left lung with known brain metastases currently followed by oncology and is on Gilotrif, who presents to ED for evaluation of intermittent altered mental status.  She describes it as "trouble getting my words out."  Symptoms began approximately 11 AM this morning.  It is currently 7:45 PM.  She lives alone in her home.  She did see her primary care provider and what appears to be her physical therapist earlier today, both of which were not concerned about her mental status.  She denies any injuries or falls.  States that she usually ambulates with a cane at her baseline.  She denies any weakness, numbness, back pain, loss of bowel or bladder function, fevers, chest pain, abdominal pain, vomiting, diarrhea, prior CVA.  HPI  Past Medical History:  Diagnosis Date  . Adenocarcinoma of left lung, stage 4 ([facility]) 03/18/2017  . Arthritis    Neck,shoulder  . Brain metastases ([facility]) 04/06/2017  . Colonic polyp   . Difficulty sleeping   . Encounter for antineoplastic chemotherapy 04/06/2017  . History of radiation therapy 12/04/15-12/14/15   left upper lobe 60 Gy  . Hx of unilateral nephrectomy 1994   right   . Hyperlipemia   . Lung cancer ([facility])   . Numbness in feet   . Peripheral neuropathy   . Personal history of PE (pulmonary embolism) 2011    Patient Active Problem List   Diagnosis Date Noted  . Stroke ([facility]) 11/11/2017  . Physical deconditioning 10/27/2017  . Encounter for antineoplastic chemotherapy 04/06/2017  . Goals of care, counseling/discussion 04/06/2017  . Brain metastases ([facility] [facility]) 04/06/2017  . Adenocarcinoma of left lung, stage 4 ([facility]) 03/18/2017  . Liver mass 03/09/2017  .  Solitary pulmonary nodule 10/17/2015  . Tachycardia 06/19/2014  . Chronic anticoagulation 06/19/2014  . History of pulmonary embolism 06/19/2014  . Degenerative arthritis of hip 02/25/2013  . Hereditary and idiopathic peripheral neuropathy 01/05/2013  . Hip pain 01/05/2013    Past Surgical History:  Procedure Laterality Date  . ABDOMINAL HYSTERECTOMY    . APPENDECTOMY    . cataracts Bilateral 09/2014   removal  . CESAREAN SECTION    . NEPHRECTOMY  1993   RT -   . TOTAL HIP ARTHROPLASTY Right 02/25/2013   Procedure: RIGHT TOTAL HIP ARTHROPLASTY ANTERIOR APPROACH;  Surgeon: Mcarthur Rossetti, MD;  Location: [facility];  Service: Orthopedics;  Laterality: Right;    OB History    No data available       Home Medications    Prior to Admission medications   Medication Sig Start Date End Date Taking? Authorizing Provider  afatinib dimaleate (GILOTRIF) 30 MG tablet Take 1 tablet (30 mg total) by mouth daily. Take on an empty stomach 1hr before or 2hrs after meals. 10/08/17  Yes Curt Bears, MD  CALCIUM CARBONATE PO Take 1 tablet by mouth daily.   Yes [provider]  celecoxib (CELEBREX) 200 MG capsule take 1 capsule by mouth once daily if needed for NECK PAIN OR ARTHRITIS 11/30/15  Yes [provider]  metoprolol succinate (TOPROL-XL) 25 MG 24 hr tablet Take 0.5 tablets (12.5 mg total) by mouth daily. 06/19/15  Yes Candee Furbish  C, MD  Multiple Vitamin (MULTIVITAMIN WITH MINERALS) TABS Take 1 tablet by mouth daily.   Yes [provider]  rivaroxaban (XARELTO) 20 MG TABS tablet Take 20 mg by mouth daily with supper.   Yes [provider]  gabapentin (NEURONTIN) 100 MG capsule Take 300 mg by mouth at bedtime. 3 tablets at bedtime    [provider]  prochlorperazine (COMPAZINE) 10 MG tablet Take 1 tablet (10 mg total) by mouth every 6 (six) hours as needed for nausea or vomiting. Patient not taking: Reported on 11/11/2017 09/09/17   Curt Bears, MD    Family History Family History  Problem Relation Age of Onset  . Hypertension Father   . Diabetes Father   . Hypertension Mother   . Colon cancer Sister     Social History Social History   Tobacco Use  . Smoking status: Former Smoker    Packs/day: 0.25    Years: 20.00    Pack years: 5.00    Types: Cigarettes    Last attempt to quit: 10/20/1989    Years since quitting: 28.0  . Smokeless tobacco: Never Used  Substance Use Topics  . Alcohol use: No    Alcohol/week: 0.0 oz  . Drug use: No     Allergies   Penicillins   Review of Systems Review of Systems  Constitutional: Negative for appetite change, chills and fever.  HENT: Negative for ear pain, rhinorrhea, sneezing and sore throat.   Eyes: Negative for photophobia and visual disturbance.  Respiratory: Negative for cough, chest tightness, shortness of breath and wheezing.   Cardiovascular: Negative for chest pain and palpitations.  Gastrointestinal: Negative for abdominal pain, blood in stool, constipation, diarrhea, nausea and vomiting.  Genitourinary: Negative for dysuria, hematuria and urgency.  Musculoskeletal: Negative for myalgias.  Skin: Negative for rash.  Neurological: Negative for dizziness, weakness and light-headedness.  Psychiatric/Behavioral: Positive for confusion.     Physical Exam Updated Vital Signs BP (!) 149/70 (BP Location: Right Arm)   Pulse 72   Temp 97.9 F (36.6 C)   Resp (!) 24   SpO2 98%   Physical Exam  Constitutional: She is oriented to person, place, and time. She appears well-developed and well-nourished. No distress.  Nontoxic appearing in no acute distress.  HENT:  Head: Normocephalic and atraumatic.  Nose: Nose normal.  Eyes: Conjunctivae and EOM are normal. Pupils are equal, round, and reactive to light. Right eye exhibits no discharge. Left eye exhibits no discharge. No scleral icterus.  Neck: Normal range of motion. Neck supple.  Cardiovascular: Normal  rate, regular rhythm, normal heart sounds and intact distal pulses. Exam reveals no gallop and no friction rub.  No murmur heard. Pulmonary/Chest: Effort normal and breath sounds normal. No respiratory distress.  Abdominal: Soft. Bowel sounds are normal. She exhibits no distension. There is no tenderness. There is no guarding.  Musculoskeletal: Normal range of motion. She exhibits no edema.  Neurological: She is alert and oriented to person, place, and time. No cranial nerve deficit or sensory deficit. She exhibits normal muscle tone. Coordination normal.  Slight word finding difficulty (about 2 times during my 21min evaluation with her). Alert and oriented to self, family members, place, time and situation.  She tells me it is Wednesday, November 11, 2017.  She is able to recall the name of her oncologist and PCP.  Pupils reactive. No facial asymmetry noted. Cranial nerves appear grossly intact. Sensation intact to light touch on face, BUE and BLE. Strength 5/5  in BUE and BLE. Normal finger to nose coordination bilaterally.  Skin: Skin is warm and dry. No rash noted.  Psychiatric: She has a normal mood and affect.  Nursing note and vitals reviewed.    ED Treatments / Results  Labs (all labs ordered are listed, but only abnormal results are displayed) Labs Reviewed  COMPREHENSIVE METABOLIC PANEL - Abnormal; Notable for the following components:      Result Value   Glucose, Bld 112 (*)    Creatinine, Ser 1.04 (*)    AST 45 (*)    Alkaline Phosphatase 371 (*)    GFR calc non Af Amer 47 (*)    GFR calc Af Amer 54 (*)    All other components within normal limits  URINALYSIS, ROUTINE W REFLEX MICROSCOPIC - Abnormal; Notable for the following components:   Hgb urine dipstick SMALL (*)    Nitrite POSITIVE (*)    Leukocytes, UA SMALL (*)    Bacteria, UA MANY (*)    All other components within normal limits  CBG MONITORING, ED - Abnormal; Notable for the following components:    Glucose-Capillary 114 (*)    All other components within normal limits  CBC WITH DIFFERENTIAL/PLATELET    EKG  EKG Interpretation None       Radiology Mr Jeri Cos And Wo Contrast  Result Date: 11/11/2017 CLINICAL DATA:  Intermittent confusion and aphasia today. Dizziness beginning this afternoon. History of lung cancer with brain metastasis. EXAM: MRI HEAD WITHOUT AND WITH CONTRAST TECHNIQUE: Multiplanar, multiecho pulse sequences of the brain and surrounding structures were obtained without and with intravenous contrast. CONTRAST:  97mL MULTIHANCE GADOBENATE DIMEGLUMINE 529 MG/ML IV SOLN COMPARISON:  MRI of the head August 06, 2017 FINDINGS: INTRACRANIAL CONTENTS: 3 foci of reduced diffusion LEFT temporal occipital lobe measuring to 4 mm, largest lesion demonstrates low ADC values. No susceptibility artifact to suggest hemorrhage. New curvilinear enhancement RIGHT cerebellar folia measuring 2 1 cm. Nonenhancing 4 mm T2 hyperintensity RIGHT posterior cerebellum new from prior study. Faint linear enhancement LEFT frontal lobe cortex corresponding to old treated metastasis. Old small LEFT inferior cerebellar infarct present previously. Ventricles and sulci are normal for patient's age. No midline shift, mass effect. No abnormal extra-axial fluid collections or extra-axial enhancement. VASCULAR: Normal major intracranial vascular flow voids present at skull base. SKULL AND UPPER CERVICAL SPINE: Expanded partially empty sella. No suspicious calvarial bone marrow signal. Craniocervical junction maintained. SINUSES/ORBITS: The mastoid air-cells and included paranasal sinuses are well-aerated.The included ocular globes and orbital contents are non-suspicious. OTHER: None. IMPRESSION: 1. Three acute LEFT temporal occipital lobe infarcts measuring to 4 mm. 2. New RIGHT cerebellar folia enhancement concerning for metastasis though, subacute infarct could have a similar appearance. Additional new nonacute small  RIGHT cerebellar infarct. Old small LEFT cerebellar infarct. 3. Tiny residual treated LEFT frontal lobe metastasis. Electronically Signed   By: Elon Alas M.D.   On: 11/11/2017 22:08    Procedures Procedures (including critical care time)  Medications Ordered in ED Medications  gadobenate dimeglumine (MULTIHANCE) injection 7 mL (7 mLs Intravenous Contrast Given 11/11/17 2122)     Initial Impression / Assessment and Plan / ED Course  I have reviewed the triage vital signs and the nursing notes.  Pertinent labs & imaging results that were available during my care of the patient were reviewed by me and considered in my medical decision making (see chart for details).     Patient, with a past medical history of adenocarcinoma of left lung  with known brain metastases currently followed by oncology, presents to ED for evaluation of intermittent word finding difficulty approximately 8-8.5 hours prior to arrival.  She denies any injuries, falls or urinary symptoms.  On physical exam she is overall well-appearing.  She has no deficits on her neurological exam including no weakness, loss of sensation, facial droop or changes in memory.  She is alert and oriented x4.  Urinalysis with evidence of UTI showing + nitrite, leukocytes and bacteria.  CBC, BMP unremarkable.  MRI of head which was initially scheduled in 2 days by her oncologist, was done today and showed 3 acute left temporal occipital lobe infarcts measuring up to 4 mm.  Patient is not a candidate for TPA at this time based on the time onset of her symptoms and comorbidities.  Will consult to hospitalist for stroke workup.  Neurology recommends that patient should be transferred to [facility] for stroke workup. Hospitalist updated on plan. Appreciate the help of hospitalist and neurology for management of this patient.  Patient discussed with and seen by Dr. Eulis Foster.  Final Clinical Impressions(s) / ED Diagnoses   Final diagnoses:  None     ED Discharge Orders    None     Portions of this note were generated with Dragon dictation software. Dictation errors may occur despite best attempts at proofreading.    Delia Heady, PA-C 11/11/17 2304    Daleen Bo, MD 11/11/17 [phone]

## 2017-11-11 NOTE — ED Triage Notes (Signed)
Pt BIB EMS from home with complaints of confusion since around 1100 this morning. Patient became alert per normal and then around 1400 began she began getting confused again. Patient has had bouts of aphasia witnessed by EMS. Patient also complaining of dizziness throughout the day starting around 1400. Patient is A&O x4 and ambulatory. No physical deficits noted.

## 2017-11-11 NOTE — Therapy (Addendum)
[place], [place], [address] Phone: [phone]   Fax:  [phone]  Physical Therapy Treatment  Patient Details  Name: [name] MRN: [id number] Date of Birth: February 16, 1928 Referring Provider: Mikey Bussing   Encounter Date: 11/11/2017  PT End of Session - 11/11/17 1055    Visit Number  4    Number of Visits  9    Date for PT Re-Evaluation  11/25/17    PT Start Time  1022    PT Stop Time  1052 Pt lightheaded and needed to end session early    PT Time Calculation (min)  30 min    Activity Tolerance  Patient tolerated treatment well;Patient limited by fatigue    Behavior During Therapy  [facility] for tasks assessed/performed       Past Medical History:  Diagnosis Date  . Adenocarcinoma of left lung, stage 4 ([place]) 03/18/2017  . Arthritis    Neck,shoulder  . Brain metastases ([place]) 04/06/2017  . Colonic polyp   . Difficulty sleeping   . Encounter for antineoplastic chemotherapy 04/06/2017  . History of radiation therapy 12/04/15-12/14/15   left upper lobe 60 Gy  . Hx of unilateral nephrectomy 1994   right   . Hyperlipemia   . Lung cancer ([place])   . Numbness in feet   . Peripheral neuropathy   . Personal history of PE (pulmonary embolism) 2011    Past Surgical History:  Procedure Laterality Date  . ABDOMINAL HYSTERECTOMY    . APPENDECTOMY    . cataracts Bilateral 09/2014   removal  . CESAREAN SECTION    . NEPHRECTOMY  1993   RT -   . TOTAL HIP ARTHROPLASTY Right 02/25/2013   Procedure: RIGHT TOTAL HIP ARTHROPLASTY ANTERIOR APPROACH;  Surgeon: Mcarthur Rossetti, MD;  Location: [facility];  Service: Orthopedics;  Laterality: Right;    There were no vitals filed for this visit.  Subjective Assessment - 11/11/17 1026    Subjective  I've been doing the HEP and they have been going really well. I've noticed more soreness with doing the HEP but it's not stopping me from doing anything.     Pertinent History  metastatic non-small cell lung cancer that was initially diagnosed as a stage IA status post stereotactic radiotherapy of the left upper lobe and now she presented with disease recurrence as well as mediastinal lymphadenopathy and liver metastasis and solitary brain metastasis. currently taking chemotherapy pill and has completed radiation., neuropathy, Rt. THR, history of PE    Patient Stated Goals  to be able to walk stronger, get rid of cane    Currently in Pain?  No/denies                      [facility] Adult PT Treatment/Exercise - 11/11/17 0001      Neuro Re-ed    Neuro Re-ed Details   In // bars: Heel-toe front and retro; high knee marching; toe walking (very challenging for pt and only 1 time of toe walking) 2 times of each. Pt had to sit after 1 time of toe walking and reported she had been feeling lightheaded each time she has stood up during therapy this morning and wants to stop for the day. VCs throughout for erect posture and to avoid looking down.      Knee/Hip Exercises: Aerobic   Recumbent Bike  Attempted this but pt started having Lt hip pain so  switched to NuStep.  Nustep  Level 4, x 5 minutes; pt had no increased Lt hip pain with this      Knee/Hip Exercises: Seated   Long Arc Quad  Strengthening;Right;Left;10 reps;Weights 5 sec holds    Long Arc Quad Weight  1 lbs.    Ball Squeeze  2 x 10 with 5 sec holds squeezing purple ball (on ortho side).    Clamshell with TheraBand  Yellow 2 x 10    Marching  Strengthening;Right;Left;10 reps;Weights    Marching Weights  1 lbs.                  PT Long Term Goals - 10/28/17 1659      PT LONG TERM GOAL #1   Title  Pt will demonstrate 3+/5 left dorsiflexor strength to decrease risk of falls when ambulating.    Baseline  2-/5    Time  4    Period  Weeks    Status  New    Target Date  11/25/17      PT LONG TERM GOAL #2   Title  Pt will demonstrate 4/5 bilateral hip flexor strength to  decrease risk of falls to to allow pt to ambulate with a more normalized gait pattern.    Baseline  3/5    Time  4    Period  Weeks    Status  New    Target Date  11/25/17      PT LONG TERM GOAL #3   Title  Pt will be able to don/doff socks and shoes independently without increased time    Baseline  pt states it is very difficult and takes 10-15 min    Time  4    Period  Weeks    Status  New    Target Date  11/25/17      PT LONG TERM GOAL #4   Title  Pt will be independent in a home exercise program for continued strengthening and stretching    Time  4    Period  Weeks    Status  New    Target Date  11/25/17      PT LONG TERM GOAL #5   Title  Pt will be able to complete TUG in less than 13 seconds to decrease risk of falls    Baseline  25 sec     Time  4    Period  Weeks    Status  New    Target Date  11/25/17            Plan - 11/11/17 1056    Clinical Impression Statement  PT had her yearly check up with PCP this morning and needed to fast due to blood worl. So the more we did in therapy the more lightheaded/dizzy she started to feel (mostly with sit-stand) so suggested to end session early today and pt readily agreed reporting not feeling up to it. Overall though pt was doing well until then, tolerating NuStep very well and increasing resistance to add ankle weights with seated LE strength. Did instruct pt how to progress HEP by preforming more reps since pt doesn't have ankle weights and she verbalized understanding this reporting the exercises have already felt very beneficial.     Rehab Potential  Good    Clinical Impairments Affecting Rehab Potential  pt currently taking chemo, hx of neuropathy bilateral feet    PT Frequency  2x / week    PT Duration  8 weeks  PT Treatment/Interventions  ADLs/Self Care Home Management;Therapeutic exercise;Therapeutic activities;Gait training;Balance training;Neuromuscular re-education;Patient/family education;Manual  techniques;Taping;Passive range of motion    PT Next Visit Plan  Cont NuStep, review HEP prn and cont bil LE strengthening and flexibility, gait and balance in parallel bars    Consulted and Agree with Plan of Care  Patient       Patient will benefit from skilled therapeutic intervention in order to improve the following deficits and impairments:  Abnormal gait, Decreased endurance, Decreased strength, Decreased balance, Decreased mobility, Difficulty walking, Decreased range of motion, Postural dysfunction  Visit Diagnosis: Difficulty in walking, not elsewhere classified  Muscle weakness (generalized)  Abnormal posture     Problem List Patient Active Problem List   Diagnosis Date Noted  . Physical deconditioning 10/27/2017  . Encounter for antineoplastic chemotherapy 04/06/2017  . Goals of care, counseling/discussion 04/06/2017  . Brain metastases ([place]) 04/06/2017  . Adenocarcinoma of left lung, stage 4 ([place]) 03/18/2017  . Liver mass 03/09/2017  . Solitary pulmonary nodule 10/17/2015  . Tachycardia 06/19/2014  . Chronic anticoagulation 06/19/2014  . History of pulmonary embolism 06/19/2014  . Degenerative arthritis of hip 02/25/2013  . Hereditary and idiopathic peripheral neuropathy 01/05/2013  . Hip pain 01/05/2013    Otelia Limes, PTA 11/11/2017, 11:00 AM  [place], [place], [address] Phone: [phone]   Fax:  [phone]  Name: [name] MRN: [id number] Date of Birth: February 03, 1928  PHYSICAL THERAPY DISCHARGE SUMMARY  Visits from Start of Care: 4  Current functional level related to goals / functional outcomes: See above- Pt's son called and stated pt had a stroke and wanted to cancel all appointments   Remaining deficits: See above   Education / Equipment: See above  Plan: Patient agrees to discharge.  Patient goals were not met. Patient is being discharged  due to a change in medical status.  ?????    Allyson Sabal [place], [place] 03/08/18 12:58 PM

## 2017-11-11 NOTE — H&P (Signed)
[facility] H&P   Patient Demographics:    [name], is a 84 y.o. female  MRN: [id number]   DOB - 01-09-1928  Admit Date - 11/11/2017  Outpatient Primary MD for the patient is Derinda Late, MD  Referring MD/NP/PA:   Delia Heady  Outpatient Specialists:     Patient coming from:   home  Chief Complaint  Patient presents with  . Altered Mental Status      HPI:    [name]  is a 84 y.o. female,  w hyperlipidemia, metastatic adenocarcinoma lung (brain), PE,  Who apparently had trouble with word finding earlier today.  Pt is not a candidate for TPA due to brain mets.  Pt denies headache, numbness, tingling, weakness, vision change.  Pt presented to ED for evaluation.    In ED,  MRI brain IMPRESSION: 1. Three acute LEFT temporal occipital lobe infarcts measuring to 4 mm. 2. New RIGHT cerebellar folia enhancement concerning for metastasis though, subacute infarct could have a similar appearance. Additional new nonacute small RIGHT cerebellar infarct. Old small LEFT cerebellar infarct. 3. Tiny residual treated LEFT frontal lobe metastasis.  Wbc 6.9, hgb 13.9, Plt 200 Na 137, K 3.9,  Bun 20, Creatinine 1.04 Ast 45, Alt 35, Alk phos 371, T. Bili 0.7  Urinalysis wbc 6-30  Neurology consulted by ED, and will be by in AM.      Review of systems:    In addition to the HPI above,  No Fever-chills, No Headache, No changes with Vision or hearing, No problems swallowing food or Liquids, No Chest pain, Cough or Shortness of Breath, No Abdominal pain, No Nausea or Vommitting, Bowel movements are regular, No Blood in stool or Urine, No dysuria, No new skin rashes or bruises, No new joints pains-aches,  No new weakness, tingling, numbness in any extremity, No recent weight gain or loss, No polyuria, polydypsia or polyphagia, No significant Mental Stressors.  A  full 10 point Review of Systems was done, except as stated above, all other Review of Systems were negative.   With Past History of the following :    Past Medical History:  Diagnosis Date  . Adenocarcinoma of left lung, stage 4 ([place]) 03/18/2017  . Arthritis    Neck,shoulder  . Brain metastases ([place]) 04/06/2017  . Colonic polyp   . Difficulty sleeping   . Encounter for antineoplastic chemotherapy 04/06/2017  . History of radiation therapy 12/04/15-12/14/15   left upper lobe 60 Gy  . Hx of unilateral nephrectomy 1994   right   . Hyperlipemia   . Lung cancer ([place])   . Numbness in feet   . Peripheral neuropathy   . Personal history of PE (pulmonary embolism) 2011      Past Surgical History:  Procedure Laterality Date  . ABDOMINAL HYSTERECTOMY    . APPENDECTOMY    . cataracts Bilateral 09/2014   removal  .  CESAREAN SECTION    . NEPHRECTOMY  1993   RT -   . TOTAL HIP ARTHROPLASTY Right 02/25/2013   Procedure: RIGHT TOTAL HIP ARTHROPLASTY ANTERIOR APPROACH;  Surgeon: Mcarthur Rossetti, MD;  Location: [facility];  Service: Orthopedics;  Laterality: Right;      Social History:     Social History   Tobacco Use  . Smoking status: Former Smoker    Packs/day: 0.25    Years: 20.00    Pack years: 5.00    Types: Cigarettes    Last attempt to quit: 10/20/1989    Years since quitting: 28.0  . Smokeless tobacco: Never Used  Substance Use Topics  . Alcohol use: No    Alcohol/week: 0.0 oz     Lives - at home  Mobility - walks by self   Family History :     Family History  Problem Relation Age of Onset  . Hypertension Father   . Diabetes Father   . Hypertension Mother   . Colon cancer Sister       Home Medications:   Prior to Admission medications   Medication Sig Start Date End Date Taking? Authorizing Provider  afatinib dimaleate (GILOTRIF) 30 MG tablet Take 1 tablet (30 mg total) by mouth daily. Take on an empty stomach 1hr before or 2hrs after meals. 10/08/17   Yes Curt Bears, MD  CALCIUM CARBONATE PO Take 1 tablet by mouth daily.   Yes [provider]  celecoxib (CELEBREX) 200 MG capsule take 1 capsule by mouth once daily if needed for NECK PAIN OR ARTHRITIS 11/30/15  Yes [provider]  metoprolol succinate (TOPROL-XL) 25 MG 24 hr tablet Take 0.5 tablets (12.5 mg total) by mouth daily. 06/19/15  Yes Jerline Pain, MD  Multiple Vitamin (MULTIVITAMIN WITH MINERALS) TABS Take 1 tablet by mouth daily.   Yes [provider]  rivaroxaban (XARELTO) 20 MG TABS tablet Take 20 mg by mouth daily with supper.   Yes [provider]  gabapentin (NEURONTIN) 100 MG capsule Take 300 mg by mouth at bedtime. 3 tablets at bedtime    [provider]  prochlorperazine (COMPAZINE) 10 MG tablet Take 1 tablet (10 mg total) by mouth every 6 (six) hours as needed for nausea or vomiting. Patient not taking: Reported on 11/11/2017 09/09/17   Curt Bears, MD     Allergies:     Allergies  Allergen Reactions  . Penicillins Rash    Has patient had a PCN reaction causing immediate rash, facial/tongue/throat swelling, SOB or lightheadedness with hypotension: Unknown Has patient had a PCN reaction causing severe rash involving mucus membranes or skin necrosis: Unknown Has patient had a PCN reaction that required hospitalization: Unknown Has patient had a PCN reaction occurring within the last 10 years: Unknown If all of the above answers are "NO", then may proceed with Cephalosporin use.      Physical Exam:   Vitals  Blood pressure 134/74, pulse 82, temperature 97.9 F (36.6 C), temperature source Oral, resp. rate (!) 22, SpO2 97 %.   1. General  lying in bed in NAD,    2. Normal affect and insight, Not Suicidal or Homicidal, Awake Alert, Oriented X 3.  3. No F.N deficits, ALL C.Nerves Intact, Strength 5/5 all 4 extremities, Sensation intact all 4 extremities, Plantars down going.  4. Ears and Eyes appear Normal,  Conjunctivae clear, PERRLA. Moist Oral Mucosa.  5. Supple Neck, No JVD, No cervical lymphadenopathy appriciated, No Carotid Bruits.  6.  Symmetrical Chest wall movement, Good air movement bilaterally, CTAB.  7. RRR, No Gallops, Rubs or Murmurs, No Parasternal Heave.  8. Positive Bowel Sounds, Abdomen Soft, No tenderness, No organomegaly appriciated,No rebound -guarding or rigidity.  9.  No Cyanosis, Normal Skin Turgor, No Skin Rash or Bruise.  10. Good muscle tone,  joints appear normal , no effusions, Normal ROM.  11. No Palpable Lymph Nodes in Neck or Axillae  Speech fluent, pt able to walk     Data Review:    CBC Recent Labs  Lab 11/09/17 1251 11/11/17 1933  WBC 6.3 6.9  HGB  --  13.9  HCT 39.9 40.0  PLT 196 200  MCV 92.8 89.9  MCH 30.9 31.2  MCHC 33.3 34.8  RDW 14.7 14.4  LYMPHSABS 2.0 1.7  MONOABS 0.7 0.7  EOSABS 0.4 0.3  BASOSABS 0.0 0.0   ------------------------------------------------------------------------------------------------------------------  Chemistries  Recent Labs  Lab 11/09/17 1251 11/11/17 1933  NA 138 137  K 4.5 3.9  CL 104 103  CO2 26 25  GLUCOSE 88 112*  BUN 18 20  CREATININE  --  1.04*  CALCIUM 9.3 9.3  AST 40* 45*  ALT 32 35  ALKPHOS 369* 371*  BILITOT 0.6 0.7   ------------------------------------------------------------------------------------------------------------------ estimated creatinine clearance is 40.1 mL/min (A) (by C-G formula based on SCr of 1.04 mg/dL (H)). ------------------------------------------------------------------------------------------------------------------ No results for input(s): TSH, T4TOTAL, T3FREE, THYROIDAB in the last 72 hours.  Invalid input(s): FREET3  Coagulation profile No results for input(s): INR, PROTIME in the last 168 hours. ------------------------------------------------------------------------------------------------------------------- No results for input(s): DDIMER in the  last 72 hours. -------------------------------------------------------------------------------------------------------------------  Cardiac Enzymes No results for input(s): CKMB, TROPONINI, MYOGLOBIN in the last 168 hours.  Invalid input(s): CK ------------------------------------------------------------------------------------------------------------------ No results found for: BNP   ---------------------------------------------------------------------------------------------------------------  Urinalysis    Component Value Date/Time   COLORURINE YELLOW 11/11/2017 2048   APPEARANCEUR CLEAR 11/11/2017 2048   LABSPEC 1.008 11/11/2017 2048   PHURINE 5.0 11/11/2017 2048   GLUCOSEU NEGATIVE 11/11/2017 2048   HGBUR SMALL (A) 11/11/2017 2048   BILIRUBINUR NEGATIVE 11/11/2017 2048   KETONESUR NEGATIVE 11/11/2017 2048   PROTEINUR NEGATIVE 11/11/2017 2048   UROBILINOGEN 0.2 02/17/2013 1340   NITRITE POSITIVE (A) 11/11/2017 2048   LEUKOCYTESUR SMALL (A) 11/11/2017 2048    ----------------------------------------------------------------------------------------------------------------   Imaging Results:    Mr Brain W And Wo Contrast  Result Date: 11/11/2017 CLINICAL DATA:  Intermittent confusion and aphasia today. Dizziness beginning this afternoon. History of lung cancer with brain metastasis. EXAM: MRI HEAD WITHOUT AND WITH CONTRAST TECHNIQUE: Multiplanar, multiecho pulse sequences of the brain and surrounding structures were obtained without and with intravenous contrast. CONTRAST:  50m MULTIHANCE GADOBENATE DIMEGLUMINE 529 MG/ML IV SOLN COMPARISON:  MRI of the head August 06, 2017 FINDINGS: INTRACRANIAL CONTENTS: 3 foci of reduced diffusion LEFT temporal occipital lobe measuring to 4 mm, largest lesion demonstrates low ADC values. No susceptibility artifact to suggest hemorrhage. New curvilinear enhancement RIGHT cerebellar folia measuring 2 1 cm. Nonenhancing 4 mm T2 hyperintensity  RIGHT posterior cerebellum new from prior study. Faint linear enhancement LEFT frontal lobe cortex corresponding to old treated metastasis. Old small LEFT inferior cerebellar infarct present previously. Ventricles and sulci are normal for patient's age. No midline shift, mass effect. No abnormal extra-axial fluid collections or extra-axial enhancement. VASCULAR: Normal major intracranial vascular flow voids present at skull base. SKULL AND UPPER CERVICAL SPINE: Expanded partially empty sella. No suspicious calvarial bone marrow signal. Craniocervical junction maintained. SINUSES/ORBITS: The mastoid air-cells and included paranasal  sinuses are well-aerated.The included ocular globes and orbital contents are non-suspicious. OTHER: None. IMPRESSION: 1. Three acute LEFT temporal occipital lobe infarcts measuring to 4 mm. 2. New RIGHT cerebellar folia enhancement concerning for metastasis though, subacute infarct could have a similar appearance. Additional new nonacute small RIGHT cerebellar infarct. Old small LEFT cerebellar infarct. 3. Tiny residual treated LEFT frontal lobe metastasis. Electronically Signed   By: Elon Alas M.D.   On: 11/11/2017 22:08      Assessment & Plan:    Principal Problem:   Stroke [facility]) Active Problems:   History of pulmonary embolism   Adenocarcinoma of left lung, stage 4 ([facility])   UTI (urinary tract infection)   Word finding difficulty secondary to CVA Carotid ultrasound Cardiac echo Aspirin 336m po qday Lipitor 823mpo qhs Permissive hypertension Neurology consulted by ED, appreciate input  UTI Awaiting urine culture cipro pharmacy to dose  H/o PE STOP Xarelto, high risk of bleeding ([facility])  Lung cancer stage 4 F/u with oncology  Peripheral neuropathy Cont gabapentin    DVT Prophylaxis - SCDs   AM Labs Ordered, also please review Full Orders  Family Communication: Admission, patients condition and plan of care including tests being ordered have  been discussed with the patient  who indicate understanding and agree with the plan and Code Status.  Code Status FULL CODE  Likely DC to  home  Condition GUARDED    Consults called: neurology by ED  Admission status: inpatient  Time spent in minutes :45   JaJani Gravel.D on 11/12/2017 at 12:39 AM  Between 7am to 7pm - Pager - [phone] 7pm go to [email or web address] - password [email or web address] - Office  [phone]

## 2017-11-11 NOTE — ED Notes (Signed)
Pt currently in MRI; vitals will be updated upon rtn. [employer]

## 2017-11-12 ENCOUNTER — Inpatient Hospital Stay (HOSPITAL_COMMUNITY): Payer: Medicare Other

## 2017-11-12 ENCOUNTER — Other Ambulatory Visit: Payer: Medicare Other

## 2017-11-12 ENCOUNTER — Ambulatory Visit: Payer: Medicare Other

## 2017-11-12 ENCOUNTER — Encounter (HOSPITAL_COMMUNITY): Payer: Self-pay | Admitting: Internal Medicine

## 2017-11-12 ENCOUNTER — Ambulatory Visit: Payer: Medicare Other | Admitting: Internal Medicine

## 2017-11-12 DIAGNOSIS — E440 Moderate protein-calorie malnutrition: Secondary | ICD-10-CM

## 2017-11-12 DIAGNOSIS — I639 Cerebral infarction, unspecified: Secondary | ICD-10-CM

## 2017-11-12 DIAGNOSIS — N390 Urinary tract infection, site not specified: Secondary | ICD-10-CM | POA: Diagnosis present

## 2017-11-12 LAB — BASIC METABOLIC PANEL
Anion gap: 10 (ref 5–15)
BUN: 13 mg/dL (ref 6–20)
CHLORIDE: 107 mmol/L (ref 101–111)
CO2: 22 mmol/L (ref 22–32)
CREATININE: 0.99 mg/dL (ref 0.44–1.00)
Calcium: 8.9 mg/dL (ref 8.9–10.3)
GFR, EST AFRICAN AMERICAN: 58 mL/min — AB (ref >60)
GFR, EST NON AFRICAN AMERICAN: 50 mL/min — AB (ref >60)
Glucose, Bld: 114 mg/dL — ABNORMAL HIGH (ref 65–99)
POTASSIUM: 3.8 mmol/L (ref 3.5–5.1)
SODIUM: 139 mmol/L (ref 135–145)

## 2017-11-12 LAB — ECHOCARDIOGRAM COMPLETE
Ao-asc: 29 cm
CHL CUP MV DEC (S): 299
CHL CUP RV SYS PRESS: 21 mmHg
CHL CUP TV REG PEAK VELOCITY: 214 cm/s
EWDT: 299 ms
FS: 34 % (ref 28–44)
HEIGHTINCHES: 67 in
IV/PV OW: 0.91
LA ID, A-P, ES: 35 mm
LA diam end sys: 35 mm
LADIAMINDEX: 1.87 cm/m2
LAVOL: 60.9 mL
LAVOLA4C: 55.6 mL
LAVOLIN: 32.5 mL/m2
LV PW d: 12.2 mm — AB (ref 0.6–1.1)
LVOT area: 3.8 cm2
LVOT diameter: 22 mm
MV pk A vel: 85.9 m/s
MV pk E vel: 63.5 m/s
RV TAPSE: 19.9 mm
TR max vel: 214 cm/s
WEIGHTICAEL: 2588.8 [oz_av]

## 2017-11-12 LAB — HEMOGLOBIN A1C
HEMOGLOBIN A1C: 5.5 % (ref 4.8–5.6)
MEAN PLASMA GLUCOSE: 111.15 mg/dL

## 2017-11-12 LAB — LIPID PANEL
CHOL/HDL RATIO: 2.5 ratio
CHOLESTEROL: 157 mg/dL (ref 0–200)
HDL: 64 mg/dL (ref >40)
LDL Cholesterol: 83 mg/dL (ref 0–99)
Triglycerides: 49 mg/dL (ref <150)
VLDL: 10 mg/dL (ref 0–40)

## 2017-11-12 MED ORDER — CIPROFLOXACIN HCL 500 MG PO TABS
250.0000 mg | ORAL_TABLET | Freq: Two times a day (BID) | ORAL | Status: DC
  Administered 2017-11-12 – 2017-11-13 (×3): 250 mg via ORAL
  Filled 2017-11-12 (×3): qty 1

## 2017-11-12 MED ORDER — CIPROFLOXACIN IN D5W 400 MG/200ML IV SOLN
400.0000 mg | Freq: Two times a day (BID) | INTRAVENOUS | Status: DC
  Administered 2017-11-12: 400 mg via INTRAVENOUS
  Filled 2017-11-12: qty 200

## 2017-11-12 MED ORDER — ASPIRIN 325 MG PO TABS
325.0000 mg | ORAL_TABLET | Freq: Every day | ORAL | Status: DC
  Administered 2017-11-12 – 2017-11-13 (×2): 325 mg via ORAL
  Filled 2017-11-12 (×2): qty 1

## 2017-11-12 MED ORDER — ATORVASTATIN CALCIUM 80 MG PO TABS: 80.0000 mg | ORAL_TABLET | Freq: Every day | ORAL | Status: DC

## 2017-11-12 MED ORDER — ASPIRIN 300 MG RE SUPP: 300.0000 mg | Freq: Every day | RECTAL | Status: DC

## 2017-11-12 MED ORDER — STROKE: EARLY STAGES OF RECOVERY BOOK
Freq: Once | Status: AC
  Administered 2017-11-12: 02:00:00

## 2017-11-12 MED ORDER — ATORVASTATIN CALCIUM 10 MG PO TABS
10.0000 mg | ORAL_TABLET | Freq: Every day | ORAL | Status: DC
  Administered 2017-11-12: 10 mg via ORAL
  Filled 2017-11-12: qty 1

## 2017-11-12 MED ORDER — ADULT MULTIVITAMIN W/MINERALS CH
1.0000 | ORAL_TABLET | Freq: Every day | ORAL | Status: DC
  Administered 2017-11-12 – 2017-11-13 (×2): 1 via ORAL
  Filled 2017-11-12 (×2): qty 1

## 2017-11-12 MED ORDER — ACETAMINOPHEN 650 MG RE SUPP: 650.0000 mg | RECTAL | Status: DC | PRN

## 2017-11-12 MED ORDER — ACETAMINOPHEN 160 MG/5ML PO SOLN: 650.0000 mg | ORAL | Status: DC | PRN

## 2017-11-12 MED ORDER — ACETAMINOPHEN 325 MG PO TABS: 650.0000 mg | ORAL_TABLET | ORAL | Status: DC | PRN

## 2017-11-12 MED ORDER — GABAPENTIN 300 MG PO CAPS
300.0000 mg | ORAL_CAPSULE | Freq: Every day | ORAL | Status: DC
  Administered 2017-11-12 (×2): 300 mg via ORAL
  Filled 2017-11-12 (×2): qty 1

## 2017-11-12 MED ORDER — ENSURE ENLIVE PO LIQD
237.0000 mL | Freq: Two times a day (BID) | ORAL | Status: DC
  Administered 2017-11-13: 237 mL via ORAL

## 2017-11-12 MED ORDER — CIPROFLOXACIN IN D5W 200 MG/100ML IV SOLN
200.0000 mg | Freq: Two times a day (BID) | INTRAVENOUS | Status: DC
  Filled 2017-11-12: qty 100

## 2017-11-12 NOTE — ED Notes (Signed)
[phone] cell [phone] home Makenah Karas Son

## 2017-11-12 NOTE — Progress Notes (Signed)
PT Cancellation Note  Patient Details Name: [name] MRN: [id number] DOB: 1928-04-18   Cancelled Treatment:    Reason Eval/Treat Not Completed: (P) Medical issues which prohibited therapy  RN reported change in NIHSS score, awaiting physician feedback. PT will check back this afternoon for eval.  Aivy Akter B. Migdalia Dk PT, DPT Acute Rehabilitation  [phone] Pager [phone]    [place] 11/12/2017, 9:30 AM

## 2017-11-12 NOTE — Progress Notes (Signed)
Pharmacy Antibiotic Note  [name] is a 84 y.o. female with altered mental status admitted on 11/11/2017 with UTI.  Pharmacy has been consulted for cipro dosing.  Plan: Cipro 400 mg IV q12h F/u scr/cultures     Temp (24hrs), Avg:97.9 F (36.6 C), Min:97.8 F (36.6 C), Max:98 F (36.7 C)  Recent Labs  Lab 11/09/17 1251 11/11/17 1933  WBC 6.3 6.9  CREATININE  --  1.04*    Estimated Creatinine Clearance: 40.1 mL/min (A) (by C-G formula based on SCr of 1.04 mg/dL (H)).    Allergies  Allergen Reactions  . Penicillins Rash    Has patient had a PCN reaction causing immediate rash, facial/tongue/throat swelling, SOB or lightheadedness with hypotension: Unknown Has patient had a PCN reaction causing severe rash involving mucus membranes or skin necrosis: Unknown Has patient had a PCN reaction that required hospitalization: Unknown Has patient had a PCN reaction occurring within the last 10 years: Unknown If all of the above answers are "NO", then may proceed with Cephalosporin use.     Antimicrobials this admission: 1/24 cipro >>    >>   Dose adjustments this admission:   Microbiology results:  BCx:   UCx:    Sputum:    MRSA PCR:   Thank you for allowing pharmacy to be a part of this patient's care.  Dorrene German 11/12/2017 12:43 AM

## 2017-11-12 NOTE — ED Notes (Signed)
[facility] called for transport. 

## 2017-11-12 NOTE — Evaluation (Signed)
Physical Therapy Evaluation Patient Details Name: [name] MRN: [id number] DOB: 03/26/1928 Today's Date: 11/12/2017   History of Present Illness  84 y.o. female with a history of lung cancer with brain metastasis who presents with transient episode of speech difficulty.  MRI was obtained in the emergency department which demonstrates multifocal left MCA distribution ischemic punctate infarcts.  There is also a cerebellar area concerning for metastasis versus subacute infarct. Since coming to hospital pt experienced R facial droop, slurred speech and difficulty with word finding.   Clinical Impression  Pt admitted with above diagnosis. Pt currently with functional limitations due to the deficits listed below (see PT Problem List). PTA pt independent with community ambulation using SPC, driving and independent with ADLs and iADLS. Pt currently limited by in safe mobility by mild instability with higher level balance and R sided weakness. Pt currently mod I for bed mobility and transfers and min guard for ambulation of 250 feet with RW. Pt will benefit from skilled PT to increase their independence and safety with mobility to allow discharge to the venue listed below.       Follow Up Recommendations Home health PT;Supervision/Assistance - 24 hour    Equipment Recommendations  None recommended by PT    Recommendations for Other Services OT consult     Precautions / Restrictions Precautions Precautions: None Restrictions Weight Bearing Restrictions: No      Mobility  Bed Mobility Overal bed mobility: Modified Independent             General bed mobility comments: use of bed rail to come to seated EoB  Transfers Overall transfer level: Modified independent Equipment used: Rolling walker (2 wheeled)             General transfer comment: strong, steady power up in RW   Ambulation/Gait Ambulation/Gait assistance: Min guard Ambulation Distance (Feet): 250  Feet Assistive device: Rolling walker (2 wheeled) Gait Pattern/deviations: Step-through pattern;Shuffle;Decreased dorsiflexion - right Gait velocity: slowed Gait velocity interpretation: Below normal speed for age/gender General Gait Details: min guard for safety, steady gait with no LoB, as ambulation progressed pt with increasing difficulty with R foot dorsiflexion, able to correct with vc for increased knee flexion to clear foot     Modified Rankin (Stroke Patients Only) Modified Rankin (Stroke Patients Only) Pre-Morbid Rankin Score: No symptoms Modified Rankin: Moderately severe disability     Balance Overall balance assessment: Needs assistance Sitting-balance support: Feet supported;No upper extremity supported Sitting balance-Leahy Scale: Normal     Standing balance support: No upper extremity supported Standing balance-Leahy Scale: Good               High level balance activites: Head turns High Level Balance Comments: slowed gait with head turns and minor misstep with no LoB             Pertinent Vitals/Pain Pain Assessment: No/denies pain    Home Living Family/patient expects to be discharged to:: Private residence Living Arrangements: Alone Available Help at Discharge: Family;Available 24 hours/day Type of Home: House Home Access: Level entry     Home Layout: One level Home Equipment: Walker - 2 wheels;Bedside commode;Shower seat;Grab bars - toilet;Grab bars - tub/shower;Hand held shower head;Cane - single point      Prior Function Level of Independence: Independent with assistive device(s)         Comments: ambulates with SPC, drives, independent with ADLs, and iADLs.      Hand Dominance   Dominant Hand: Right  Extremity/Trunk Assessment   Upper Extremity Assessment Upper Extremity Assessment: Defer to OT evaluation    Lower Extremity Assessment Lower Extremity Assessment: RLE deficits/detail;LLE deficits/detail RLE Deficits /  Details: AROM WFL, strength grossly 4/5 LLE Deficits / Details: AROM WFL, strength grossly 4+/5        Communication   Communication: No difficulties;Expressive difficulties  Cognition Arousal/Alertness: Awake/alert Behavior During Therapy: WFL for tasks assessed/performed Overall Cognitive Status: Within Functional Limits for tasks assessed                                        General Comments General comments (skin integrity, edema, etc.): Pt son present during evaluation        Assessment/Plan    PT Assessment Patient needs continued PT services  PT Problem List Decreased activity tolerance;Decreased balance       PT Treatment Interventions DME instruction;Gait training;Functional mobility training;Therapeutic activities;Therapeutic exercise;Balance training;Neuromuscular re-education;Cognitive remediation;Patient/family education    PT Goals (Current goals can be found in the Care Plan section)  Acute Rehab PT Goals Patient Stated Goal: go home PT Goal Formulation: With patient Time For Goal Achievement: 11/26/17 Potential to Achieve Goals: Good    Frequency Min 4X/week    AM-PAC PT "6 Clicks" Daily Activity  Outcome Measure Difficulty turning over in bed (including adjusting bedclothes, sheets and blankets)?: A Little Difficulty moving from lying on back to sitting on the side of the bed? : A Little Difficulty sitting down on and standing up from a chair with arms (e.g., wheelchair, bedside commode, etc,.)?: A Little Help needed moving to and from a bed to chair (including a wheelchair)?: None Help needed walking in hospital room?: None Help needed climbing 3-5 steps with a railing? : A Little 6 Click Score: 20    End of Session Equipment Utilized During Treatment: Gait belt Activity Tolerance: Patient tolerated treatment well Patient left: in bed;with family/visitor present;with call bell/phone within reach Nurse Communication: Mobility  status PT Visit Diagnosis: Muscle weakness (generalized) (M62.81);Other symptoms and signs involving the nervous system (R29.898);Other abnormalities of gait and mobility (R26.89)    Time: 4492-0100 PT Time Calculation (min) (ACUTE ONLY): 23 min   Charges:   PT Evaluation $PT Eval Moderate Complexity: 1 Mod PT Treatments $Gait Training: 8-22 mins   PT G Codes:        Debbie Mangiapane B. Migdalia Dk PT, DPT Acute Rehabilitation  [phone] Pager [phone]    [place] 11/12/2017, 3:43 PM

## 2017-11-12 NOTE — Progress Notes (Signed)
Initial Nutrition Assessment  DOCUMENTATION CODES:   Non-severe (moderate) malnutrition in context of chronic illness  INTERVENTION:  1. Ensure Enlive po BID, each supplement provides 350 kcal and 20 grams of protein 2. MVI w/ Minerals  NUTRITION DIAGNOSIS:   Moderate Malnutrition related to chronic illness as evidenced by moderate fat depletion, moderate muscle depletion  GOAL:   Patient will meet greater than or equal to 90% of their needs  MONITOR:   PO intake, I & O's, Labs, Supplement acceptance, Weight trends  REASON FOR ASSESSMENT:   Malnutrition Screening Tool    ASSESSMENT:   [name] is an 84 yo female with metastatic breast cancer who presents with transient aphasia, demonstrates L MCA distribution ischemic punctate infarcts  Spoke with [name] at bedside, she displays some confusion during our discussion. Reports losing weight from 170 pounds down to 162 pounds and then gaining it back. Per chart, current weight 161 pounds, indicating a 7 pound/4.1% insignificant weight loss over 2 months. She could not recall what she normally eats for breakfast, lunch, and dinner, but does drink ensure at home.   Labs reviewed  Medications reviewed and include  NUTRITION - FOCUSED PHYSICAL EXAM:    Most Recent Value  Orbital Region  Mild depletion  Upper Arm Region  Moderate depletion  Thoracic and Lumbar Region  Moderate depletion  Buccal Region  Mild depletion  Temple Region  Mild depletion  Clavicle Bone Region  Moderate depletion  Clavicle and Acromion Bone Region  Moderate depletion  Scapular Bone Region  Moderate depletion  Dorsal Hand  Severe depletion  Patellar Region  No depletion  Anterior Thigh Region  No depletion  Posterior Calf Region  No depletion  Edema (RD Assessment)  None  Hair  Reviewed  Eyes  Reviewed  Mouth  Reviewed  Skin  Reviewed  Nails  Reviewed       Diet Order:  Fall precautions Diet Heart Room service appropriate? Yes;  Fluid consistency: Thin  EDUCATION NEEDS:   Not appropriate for education at this time  Skin:  Skin Assessment: Reviewed RN Assessment  Last BM:  11/12/2017  Height:   Ht Readings from Last 1 Encounters:  11/12/17 5\' 7"  (1.702 m)    Weight:   Wt Readings from Last 1 Encounters:  11/12/17 161 lb 12.8 oz (73.4 kg)    Ideal Body Weight:  61.36 kg  BMI:  Body mass index is 25.34 kg/m.  Estimated Nutritional Needs:   Kcal:  2000-2400 calories  Protein:  95-124 grams  Fluid:  2-2.4L  [name]. Debbie Holm, MS, RD LDN Inpatient Clinical Dietitian Pager [phone]

## 2017-11-12 NOTE — Progress Notes (Addendum)
NEUROHOSPITALISTS STROKE TEAM - DAILY PROGRESS NOTE   ADMISSION HISTORY:  [name] is a 84 y.o. female with a history of lung cancer with brain metastasis who presents with transient episode of speech difficulty.  She estimates that it lasted about 4 - 5 hours.  She also has been complaining of some dizziness as well.  She states that she has not felt truly unsteady, but just mildly "dizzy."  She denies vertigo.  Due to the symptoms an MRI was obtained in the emergency department which demonstrates multifocal left MCA distribution ischemic punctate infarcts.  There is also a cerebellar area concerning for metastasis versus subacute infarct.  Of note, she takes Xarelto on empty stomach at 4 PM.  LKW: 11 AM tpa given?: no, brain tumor, anticoagulation  SUBJECTIVE (INTERVAL HISTORY)  is at the bedside. Patient is found laying in bed in NAD. Overall she feels her condition is improved.  Voices no new complaints. No new events reported overnight.   OBJECTIVE Lab Results: CBC:  Recent Labs  Lab 11/09/17 1251 11/11/17 1933  WBC 6.3 6.9  HGB  --  13.9  HCT 39.9 40.0  MCV 92.8 89.9  PLT 196 200   BMP: Recent Labs  Lab 11/09/17 1251 11/11/17 1933 11/12/17 0808  NA 138 137 139  K 4.5 3.9 3.8  CL 104 103 107  CO2 26 25 22   GLUCOSE 88 112* 114*  BUN 18 20 13   CREATININE  --  1.04* 0.99  CALCIUM 9.3 9.3 8.9   Liver Function Tests:  Recent Labs  Lab 11/09/17 1251 11/11/17 1933  AST 40* 45*  ALT 32 35  ALKPHOS 369* 371*  BILITOT 0.6 0.7  PROT 7.7 7.8  ALBUMIN 3.4* 3.6   Urinalysis:  Recent Labs  Lab 11/11/17 2048  COLORURINE YELLOW  APPEARANCEUR CLEAR  LABSPEC 1.008  PHURINE 5.0  GLUCOSEU NEGATIVE  HGBUR SMALL*  BILIRUBINUR NEGATIVE  KETONESUR NEGATIVE  PROTEINUR NEGATIVE  NITRITE POSITIVE*  LEUKOCYTESUR SMALL*   PHYSICAL EXAM Temp:  [97.8 F (36.6 C)-98.5 F (36.9 C)] 98.1 F (36.7 C) (01/24  0741) Pulse Rate:  [70-85] 73 (01/24 0741) Resp:  [16-24] 16 (01/24 0741) BP: (112-150)/(50-92) 119/62 (01/24 0741) SpO2:  [97 %-100 %] 98 % (01/24 0741) Weight:  [73.4 kg (161 lb 12.8 oz)] 73.4 kg (161 lb 12.8 oz) (01/24 0106) General - Well nourished, well developed, in no apparent distress HEENT-  Normocephalic,  Cardiovascular - Regular rate and rhythm  Respiratory - Lungs clear bilaterally. No wheezing. Abdomen - soft and non-tender, BS normal Extremities- no edema or cyanosis Neuro: Mental Status: Patient is awake, alert, oriented to person, place, month, year, and situation. Patient is able to give a clear and coherent history. No signs of neglect Her speech is fluent and she is able to name objects and repeat.  She did have a couple of times where she had a pulse thinking of the name of the particular medication. Cranial Nerves: II: Visual Fields are full. Pupils are equal, round, and reactive to light.   III,IV, VI: EOMI without ptosis or diploplia.  V: Facial sensation is symmetric to temperature VII: Facial movement is symmetric.  VIII: hearing is intact to voice X: Uvula elevates symmetrically XI: Shoulder shrug is symmetric. XII: tongue is midline without atrophy or fasciculations.  Motor: Tone is normal. Bulk is normal. 5/5 strength was present in all four extremities.  Sensory: Sensation is symmetric to light touch and temperature in the arms and legs.  Deep Tendon Reflexes: 2+ and symmetric in the biceps and patellae.  Plantars: Toes are downgoing bilaterally.  Cerebellar: FNF and HKS are intact bilaterally  IMAGING: I have personally reviewed the radiological images below and agree with the radiology interpretations. Mr Jeri Cos And Wo Contrast Result Date: 11/11/2017 IMPRESSION: 1. Three acute LEFT temporal occipital lobe infarcts measuring to 4 mm. 2. New RIGHT cerebellar folia enhancement concerning for metastasis though, subacute infarct could have a  similar appearance. Additional new nonacute small RIGHT cerebellar infarct. Old small LEFT cerebellar infarct. 3. Tiny residual treated LEFT frontal lobe metastasis. Electronically Signed   By: Elon Alas M.D.   On: 11/11/2017 22:08   Echocardiogram:                                              PENDING B/L Carotid U/S:                                                PENDING    IMPRESSION: Ms. [name] is a 84 y.o. female with PMH of metastatic breast cancer who presents with transient aphasia.  She was not taking her Xarelto with food, which decreases the bioavailability. Discussed with patient, please emphasize.    Three acute LEFT temporal occipital lobe infarcts New RIGHT cerebellar folia enhancement concerning for metastasis though, subacute infarct could have a similar appearance New nonacute small RIGHT cerebellar infarct  Suspected Etiology: hypercoagulable state due to active Brain Mets, not taking Xarelto with food Resultant Symptoms:  Transient Aphasia Stroke Risk Factors: hyperlipidemia and Hx of Lung cancer with Brain Mets and Hx of PE Other Stroke Risk Factors: Advanced age, Hx stroke  Outstanding Stroke Work-up Studies:     Echocardiogram:                                                    PENDING B/L Carotid U/S:                                                     PENDING  If workup above unremarkable, Stroke will sign off.  Have her follow up with Dr Krista Blue outpatient, [facility] neurology (is an established patient)  PLAN  11/12/2017: Continue Xarelto/ Statin Frequent neuro checks Telemetry monitoring PT/OT/SLP Consult PM & Rehab Consult Case Management /MSW Ongoing aggressive stroke risk factor management Patient counseled to be compliant with her antithrombotic medications Patient counseled on Lifestyle modifications including, Diet, Exercise, and Stress Follow up with [facility] in 6 weeks  HX OF STROKES: Old small LEFT cerebellar  infarct  Hx of PE: Currently on Xarelto  MEDICAL ISSUES: metastatic Lung cancer - Stage 4  HYPERTENSION: Stable Avoid Hypotension and Dehydration Permissive hypertension (OK if <220/120) for 24-48 hours post stroke and then gradually normalized within 5-7 days. Long term BP goal normotensive. May slowly restart home B/P medications after 48 hours Home Meds: Metoprolol  HYPERLIPIDEMIA:  Component Value Date/Time   CHOL 157 11/12/2017 0232   TRIG 49 11/12/2017 0232   HDL 64 11/12/2017 0232   CHOLHDL 2.5 11/12/2017 0232   VLDL 10 11/12/2017 0232   LDLCALC 83 11/12/2017 0232  Home Meds:  NONE LDL  goal < 70 Started on  Lipitor to 10 mg daily Continue statin at discharge  R/O DIABETES: Lab Results  Component Value Date   HGBA1C 5.5 11/12/2017  HgbA1c goal < 7.0  Other Active Problems: Principal Problem:   Stroke [facility]) Active Problems:   History of pulmonary embolism   Adenocarcinoma of left lung, stage 4 (HCC)   UTI (urinary tract infection)    Hospital day # 1 VTE prophylaxis: SCD's Diet : Fall precautions Diet Heart Room service appropriate? Yes; Fluid consistency: Thin   FAMILY UPDATES:  family at bedside  TEAM UPDATES: Doreatha Lew, MD   Prior Home Stroke Medications: Xarelto (rivaroxaban) daily  Discharge Stroke Meds:  Please discharge patient on Xarelto (rivaroxaban) daily for now  Disposition: 03-Skilled Nursing Facility Therapy Recs:               PENDING Follow Up:  Follow-up Information    Marcial Pacas, MD. Schedule an appointment as soon as possible for a visit in 6 week(s).   Specialty:  Neurology Contact information: [address] [place] [address] [phone]          Derinda Late, MD -PCP Follow up in 1-2 weeks      Attending Note:   11/12/2017 ASSESSMENT:    Personally examined patient and images, and have participated in and made any corrections needed to history, physical, neuro exam,assessment and plan  as stated above.  I have personally obtained the history, evaluated lab date, reviewed imaging studies and agree with radiology interpretations.    Sarina Ill, MD Stroke Neurology  If workup (Carotids, Echo) unremarkable, Stroke will sign off.  Have him follow up with Dr Krista Blue outpatient, [facility] neurology (is an established patient). Discharge on Xarelto, please have her take it with dinner.    Stroke Neurology Team 11/12/2017 9:40 AM  To contact Stroke Continuity provider, please refer to [email or web address] After hours, contact General Neurology

## 2017-11-12 NOTE — Consult Note (Signed)
Neurology Consultation Reason for Consult: Stroke Referring Physician: Georges Mouse  CC: Stroke  History is obtained from: Patient  HPI: [name] is a 84 y.o. female with a history of lung cancer with brain metastasis who presents with transient episode of speech difficulty.  She estimates that it lasted about 4 - 5 hours.  She also has been complaining of some dizziness as well.  She states that she has not felt truly unsteady, but just mildly "dizzy."  She denies vertigo.  Due to the symptoms an MRI was obtained in the emergency department which demonstrates multifocal left MCA distribution ischemic punctate infarcts.  There is also a cerebellar area concerning for metastasis versus subacute infarct.  Of note, she takes Xarelto on an empty stomach at 4 PM.  LKW: 11 AM tpa given?: no, brain tumor, anticoagulation   ROS: A 14 point ROS was performed and is negative except as noted in the HPI.   Past Medical History:  Diagnosis Date  . Adenocarcinoma of left lung, stage 4 ([place]) 03/18/2017  . Arthritis    Neck,shoulder  . Brain metastases ([place]) 04/06/2017  . Colonic polyp   . Difficulty sleeping   . Encounter for antineoplastic chemotherapy 04/06/2017  . History of radiation therapy 12/04/15-12/14/15   left upper lobe 60 Gy  . Hx of unilateral nephrectomy 1994   right   . Hyperlipemia   . Lung cancer ([place])   . Numbness in feet   . Peripheral neuropathy   . Personal history of PE (pulmonary embolism) 2011     Family History  Problem Relation Age of Onset  . Hypertension Father   . Diabetes Father   . Hypertension Mother   . Colon cancer Sister      Social History:  reports that she quit smoking about 28 years ago. Her smoking use included cigarettes. She has a 5.00 pack-year smoking history. she has never used smokeless tobacco. She reports that she does not drink alcohol or use drugs.   Exam: Current vital signs: BP 135/73 (BP Location: Left Arm)   Pulse 70   Temp  98.3 F (36.8 C) (Oral)   Resp 18   Ht 5\' 7"  (1.702 m)   Wt 73.4 kg (161 lb 12.8 oz)   SpO2 100%   BMI 25.34 kg/m  Vital signs in last 24 hours: Temp:  [97.8 F (36.6 C)-98.3 F (36.8 C)] 98.3 F (36.8 C) (01/24 0106) Pulse Rate:  [70-85] 70 (01/24 0106) Resp:  [18-24] 18 (01/24 0106) BP: (134-150)/(70-92) 135/73 (01/24 0106) SpO2:  [97 %-100 %] 100 % (01/24 0106) Weight:  [73.4 kg (161 lb 12.8 oz)] 73.4 kg (161 lb 12.8 oz) (01/24 0106)   Physical Exam  Constitutional: Appears well-developed and well-nourished.  Psych: Affect appropriate to situation Eyes: No scleral injection HENT: No OP obstrucion Head: Normocephalic.  Cardiovascular: Normal rate and regular rhythm.  Respiratory: Effort normal, non-labored breathing GI: Soft.  No distension. There is no tenderness.  Skin: WDI  Neuro: Mental Status: Patient is awake, alert, oriented to person, place, month, year, and situation. Patient is able to give a clear and coherent history. No signs of neglect Her speech is fluent and she is able to name objects and repeat.  She did have a couple of times where she had a pulse thinking of the name of the particular medication. Cranial Nerves: II: Visual Fields are full. Pupils are equal, round, and reactive to light.   III,IV, VI: EOMI without ptosis or  diploplia.  V: Facial sensation is symmetric to temperature VII: Facial movement is symmetric.  VIII: hearing is intact to voice X: Uvula elevates symmetrically XI: Shoulder shrug is symmetric. XII: tongue is midline without atrophy or fasciculations.  Motor: Tone is normal. Bulk is normal. 5/5 strength was present in all four extremities.  Sensory: Sensation is symmetric to light touch and temperature in the arms and legs. Deep Tendon Reflexes: 2+ and symmetric in the biceps and patellae.  Plantars: Toes are downgoing bilaterally.  Cerebellar: FNF and HKS are intact bilaterally  I have reviewed labs in epic and the  results pertinent to this consultation are: Creatinine 1.04  I have reviewed the images obtained: MRI brain-punctate restricted diffusion in the left  Impression: 84 year old female with metastatic breast cancer who presents with transient aphasia.  The duration and sudden onset would support that these areas of restricted diffusion actually do represent infarct, also their appearance I feels most consistent with that.  She was not taking her Xarelto with food, which decreases the bioavailability and therefore I think that one reasonable first step would be to take it with food.  Also looking for other causes of stroke including carotid ultrasound would be reasonable.   The strokes are small and I do not think that Xarelto would need to be held for very long.  Recommendations: 1. HgbA1c, fasting lipid panel 2. Frequent neuro checks 3. Echocardiogram 4. Carotid dopplers 5. Prophylactic therapy-Antiplatelet med: Aspirin - dose 325mg  PO or 300mg  PR 6. Risk factor modification 7. Telemetry monitoring 8. PT consult, OT consult, Speech consult 9. please page stroke NP  Or  PA  Or MD  from 8am -4 pm as this patient will be followed by the stroke team at this point.   You can look them up on [email or web address]      Roland Rack, MD [facility] [phone]  If 7pm- 7am, please page neurology on call as listed in [place].

## 2017-11-12 NOTE — Care Management Note (Signed)
Case Management Note  Patient Details  Name: [name] MRN: [id number] Date of Birth: [birth year]  Subjective/Objective:    Pt admitted with CVA and brain mets. She is from home alone.                 Action/Plan: PT recommending [facility] services. CM following for d/c needs, physician orders.   Expected Discharge Date:  11/14/17               Expected Discharge Plan:  [place]  In-House Referral:     Discharge planning Services  CM Consult  Post Acute Care Choice:    Choice offered to:     DME Arranged:    DME Agency:     HH Arranged:    HH Agency:     Status of Service:  In process, will continue to follow  If discussed at Long Length of Stay Meetings, dates discussed:    Additional Comments:  Pollie Friar, RN 11/12/2017, 4:07 PM

## 2017-11-12 NOTE — Progress Notes (Signed)
  Echocardiogram 2D Echocardiogram has been performed.  [name] G [name] 11/12/2017, 10:02 AM

## 2017-11-12 NOTE — Progress Notes (Signed)
*  PRELIMINARY RESULTS* Vascular Ultrasound Carotid Duplex (Doppler) has been completed.   Findings suggest 1-39% internal carotid artery stenosis bilaterally. Vertebral arteries are patent with antegrade flow.  11/12/2017 11:57 AM Maudry Mayhew, BS, RVT, RDCS, RDMS

## 2017-11-12 NOTE — Progress Notes (Signed)
PROGRESS NOTE  [name]:[id number] DOB: 12-24-1927 DOA: 11/11/2017 PCP: Derinda Late, MD   LOS: 1 day   Brief Narrative / Interim history: 84 year old female with history of hyperlipidemia, adenocarcinoma of left lung with known brain metastases, PE from 2011 treated with Xarelto presented to the ED due to dysarthria and dizziness. She denied numbness, tingling, fever, chest pain, abdominal pain, muscle weakness, or recent illness. MRI showed multiple lobe infarcts. Neuro consulted. Patient not a candidate for TPA due to brain mets. UA in ED showed UTI, started on Cipro. Otherwise Labs were unremarkable. Patient was admitted for stroke. Today patient is moderately disoriented with general confusion. She patient reports dizziness on exertion. No headache, muscle weakness, or focal deficits on exam.    Assessment & Plan: Principal Problem:   Stroke [facility]) Active Problems:   History of pulmonary embolism   Adenocarcinoma of left lung, stage 4 (HCC)   UTI (urinary tract infection)   Stroke Likely due to hypercoagulable state of Stage 4 lung cancer Patient was on xarelto prior due to PE history, but not taking it with food. Held on admission.  MRI showed multifocal infarcts. [place] bilateral carotid arteries revealed 1-39% stenosis. Echo revealed LVEF 70-75% with Grade 1 diastolic dysfunction. A1C 5.5. Other labs unremarkable. Neuro consulted. Plan is to discharge on xarelto Patient with persistent dizziness, disorientation, and minimal right sided facial weakness. No other focal deficits. Speech improved  UTI UA positive for nitrites/leukocytes upon ED arrival No urine culture collected prior to antibiotics Continue Cipro  Adenocarcinoma of left lung, Stage 4 Management outpatient MRI revealed new right cerebellar folia concerning for metastasis and tiny residual treated left frontal lobe metastasis  Hyperlipidemia Recent labs showed LDL 83.  Per neurology, goal is >70.  Started on Lipitor 10 mg QD  Hypertension Permissive hypertension 24-48 hours Stable, current B/P 119/62 Plan on discharge resume home meds  DVT prophylaxis: SCD Code Status: FULL Family Communication: none Disposition Plan: home  Consultants:   Neuro  Procedures:   Echo  US carotid bilateral    Antimicrobials:  none   Subjective: Patient was alert in bed. She states she is feeling better today with dizziness on exertion. She cannot recall the events of yesterday and is disoriented to time and place.   Objective: Vitals:   11/12/17 0300 11/12/17 0500 11/12/17 0700 11/12/17 0741  BP: (!) 113/53 (!) 120/53 (!) 112/50 119/62  Pulse: 72 71 73 73  Resp: 16 18 18 16   Temp: 97.9 F (36.6 C) 98.1 F (36.7 C) 98.5 F (36.9 C) 98.1 F (36.7 C)  TempSrc: Oral Oral Oral Oral  SpO2: 100% 98% 98% 98%  Weight:      Height:       No intake or output data in the 24 hours ending 11/12/17 1247 Filed Weights   11/12/17 0106  Weight: 73.4 kg (161 lb 12.8 oz)    Examination:  Constitutional: NAD Eyes: PERRL, lids and conjunctivae normal ENMT: Mucous membranes are moist. No oropharyngeal exudates Neck: normal, supple Respiratory: clear to auscultation bilaterally, no wheezing, no crackles. Normal respiratory effort. No accessory muscle use.  Cardiovascular: Regular rate and rhythm, no murmurs / rubs / gallops. No LE edema. 2+ pedal pulses. No carotid bruits.  Abdomen: no tenderness. Bowel sounds positive.  Musculoskeletal:  No joint deformity upper and lower extremities. No contractures. Normal muscle tone.  Skin: no rashes, lesions, ulcers. No induration Neurologic: CN 2-12 grossly intact. Strength 5/5 in all 4. minimal right  sided facial weakness  Psychiatric: Normal judgment. Alert. Moderately disoriented. Normal mood.    Data Reviewed: I have independently reviewed following labs and imaging studies   CBC: Recent Labs  Lab 11/09/17 1251 11/11/17 1933  WBC 6.3  6.9  NEUTROABS 3.3 4.1  HGB  --  13.9  HCT 39.9 40.0  MCV 92.8 89.9  PLT 196 680   Basic Metabolic Panel: Recent Labs  Lab 11/09/17 1251 11/11/17 1933 11/12/17 0808  NA 138 137 139  K 4.5 3.9 3.8  CL 104 103 107  CO2 26 25 22   GLUCOSE 88 112* 114*  BUN 18 20 13   CREATININE  --  1.04* 0.99  CALCIUM 9.3 9.3 8.9   GFR: Estimated Creatinine Clearance: 38.9 mL/min (by C-G formula based on SCr of 0.99 mg/dL). Liver Function Tests: Recent Labs  Lab 11/09/17 1251 11/11/17 1933  AST 40* 45*  ALT 32 35  ALKPHOS 369* 371*  BILITOT 0.6 0.7  PROT 7.7 7.8  ALBUMIN 3.4* 3.6   No results for input(s): LIPASE, AMYLASE in the last 168 hours. No results for input(s): AMMONIA in the last 168 hours. Coagulation Profile: No results for input(s): INR, PROTIME in the last 168 hours. Cardiac Enzymes: No results for input(s): CKTOTAL, CKMB, CKMBINDEX, TROPONINI in the last 168 hours. BNP (last 3 results) No results for input(s): PROBNP in the last 8760 hours. HbA1C: Recent Labs    11/12/17 0232  HGBA1C 5.5   CBG: Recent Labs  Lab 11/11/17 1943  GLUCAP 114*   Lipid Profile: Recent Labs    11/12/17 0232  CHOL 157  HDL 64  LDLCALC 83  TRIG 49  CHOLHDL 2.5   Thyroid Function Tests: No results for input(s): TSH, T4TOTAL, FREET4, T3FREE, THYROIDAB in the last 72 hours. Anemia Panel: No results for input(s): VITAMINB12, FOLATE, FERRITIN, TIBC, IRON, RETICCTPCT in the last 72 hours. Urine analysis:    Component Value Date/Time   COLORURINE YELLOW 11/11/2017 2048   APPEARANCEUR CLEAR 11/11/2017 2048   LABSPEC 1.008 11/11/2017 2048   PHURINE 5.0 11/11/2017 2048   GLUCOSEU NEGATIVE 11/11/2017 2048   HGBUR SMALL (A) 11/11/2017 2048   BILIRUBINUR NEGATIVE 11/11/2017 2048   KETONESUR NEGATIVE 11/11/2017 2048   PROTEINUR NEGATIVE 11/11/2017 2048   UROBILINOGEN 0.2 02/17/2013 1340   NITRITE POSITIVE (A) 11/11/2017 2048   LEUKOCYTESUR SMALL (A) 11/11/2017 2048   Sepsis  Labs: Invalid input(s): PROCALCITONIN, LACTICIDVEN  No results found for this or any previous visit (from the past 240 hour(s)).    Radiology Studies: Mr Jeri Cos And Wo Contrast  Result Date: 11/11/2017 CLINICAL DATA:  Intermittent confusion and aphasia today. Dizziness beginning this afternoon. History of lung cancer with brain metastasis. EXAM: MRI HEAD WITHOUT AND WITH CONTRAST TECHNIQUE: Multiplanar, multiecho pulse sequences of the brain and surrounding structures were obtained without and with intravenous contrast. CONTRAST:  64mL MULTIHANCE GADOBENATE DIMEGLUMINE 529 MG/ML IV SOLN COMPARISON:  MRI of the head August 06, 2017 FINDINGS: INTRACRANIAL CONTENTS: 3 foci of reduced diffusion LEFT temporal occipital lobe measuring to 4 mm, largest lesion demonstrates low ADC values. No susceptibility artifact to suggest hemorrhage. New curvilinear enhancement RIGHT cerebellar folia measuring 2 1 cm. Nonenhancing 4 mm T2 hyperintensity RIGHT posterior cerebellum new from prior study. Faint linear enhancement LEFT frontal lobe cortex corresponding to old treated metastasis. Old small LEFT inferior cerebellar infarct present previously. Ventricles and sulci are normal for patient's age. No midline shift, mass effect. No abnormal extra-axial fluid collections or extra-axial enhancement. VASCULAR:  Normal major intracranial vascular flow voids present at skull base. SKULL AND UPPER CERVICAL SPINE: Expanded partially empty sella. No suspicious calvarial bone marrow signal. Craniocervical junction maintained. SINUSES/ORBITS: The mastoid air-cells and included paranasal sinuses are well-aerated.The included ocular globes and orbital contents are non-suspicious. OTHER: None. IMPRESSION: 1. Three acute LEFT temporal occipital lobe infarcts measuring to 4 mm. 2. New RIGHT cerebellar folia enhancement concerning for metastasis though, subacute infarct could have a similar appearance. Additional new nonacute small RIGHT  cerebellar infarct. Old small LEFT cerebellar infarct. 3. Tiny residual treated LEFT frontal lobe metastasis. Electronically Signed   By: Elon Alas M.D.   On: 11/11/2017 22:08     Scheduled Meds: . aspirin  300 mg Rectal Daily   Or  . aspirin  325 mg Oral Daily  . atorvastatin  10 mg Oral q1800  . ciprofloxacin  250 mg Oral BID  . feeding supplement (ENSURE ENLIVE)  237 mL Oral BID BM  . gabapentin  300 mg Oral QHS   Continuous Infusions:     Time spent:     Sheala Dosh, PA-S

## 2017-11-13 ENCOUNTER — Other Ambulatory Visit: Payer: Medicare Other

## 2017-11-13 ENCOUNTER — Encounter: Payer: Medicare Other | Admitting: Physical Therapy

## 2017-11-13 ENCOUNTER — Telehealth: Payer: Self-pay | Admitting: *Deleted

## 2017-11-13 DIAGNOSIS — C3490 Malignant neoplasm of unspecified part of unspecified bronchus or lung: Secondary | ICD-10-CM

## 2017-11-13 MED ORDER — METOPROLOL SUCCINATE ER 25 MG PO TB24: 12.5000 mg | ORAL_TABLET | Freq: Every day | ORAL | 3 refills | Status: DC

## 2017-11-13 MED ORDER — ASPIRIN 325 MG PO TABS: 325.0000 mg | ORAL_TABLET | Freq: Every day | ORAL | 0 refills | Status: DC

## 2017-11-13 MED ORDER — CIPROFLOXACIN HCL 250 MG PO TABS: 250.0000 mg | ORAL_TABLET | Freq: Two times a day (BID) | ORAL | 0 refills | Status: AC

## 2017-11-13 MED ORDER — ATORVASTATIN CALCIUM 10 MG PO TABS: 10.0000 mg | ORAL_TABLET | Freq: Every day | ORAL | 0 refills | Status: DC

## 2017-11-13 NOTE — Telephone Encounter (Signed)
Pt son [name] called to advise pt was at Rehab and had stroke shortly after leaving on 1/23. [name] asked if MD woulds like Pt to continue all medications as before. Discussed with stephen, pt will receive a discharge summary with list of all medications and appts. I will review with MD if pt should continue Gilotrif and if she needs to be seen sooner than 2/26 appt. No further concerns.

## 2017-11-13 NOTE — Evaluation (Signed)
Speech Language Pathology Evaluation Patient Details Name: [name] MRN: [id number] DOB: 04/30/1928 Today's Date: 11/13/2017 Time: 1110-1140 SLP Time Calculation (min) (ACUTE ONLY): 30 min  Problem List:  Patient Active Problem List   Diagnosis Date Noted  . UTI (urinary tract infection) 11/12/2017  . Malnutrition of moderate degree 11/12/2017  . Stroke ([place]) 11/11/2017  . Physical deconditioning 10/27/2017  . Encounter for antineoplastic chemotherapy 04/06/2017  . Goals of care, counseling/discussion 04/06/2017  . Brain metastases ([place]) 04/06/2017  . Adenocarcinoma of left lung, stage 4 ([place]) 03/18/2017  . Liver mass 03/09/2017  . Solitary pulmonary nodule 10/17/2015  . Tachycardia 06/19/2014  . Chronic anticoagulation 06/19/2014  . History of pulmonary embolism 06/19/2014  . Degenerative arthritis of hip 02/25/2013  . Hereditary and idiopathic peripheral neuropathy 01/05/2013  . Hip pain 01/05/2013   Past Medical History:  Past Medical History:  Diagnosis Date  . Adenocarcinoma of left lung, stage 4 ([place]) 03/18/2017  . Arthritis    Neck,shoulder  . Brain metastases ([place]) 04/06/2017  . Colonic polyp   . Difficulty sleeping   . Encounter for antineoplastic chemotherapy 04/06/2017  . History of radiation therapy 12/04/15-12/14/15   left upper lobe 60 Gy  . Hx of unilateral nephrectomy 1994   right   . Hyperlipemia   . Lung cancer ([place])   . Numbness in feet   . Peripheral neuropathy   . Personal history of PE (pulmonary embolism) 2011   Past Surgical History:  Past Surgical History:  Procedure Laterality Date  . ABDOMINAL HYSTERECTOMY    . APPENDECTOMY    . cataracts Bilateral 09/2014   removal  . CESAREAN SECTION    . NEPHRECTOMY  1993   RT -   . TOTAL HIP ARTHROPLASTY Right 02/25/2013   Procedure: RIGHT TOTAL HIP ARTHROPLASTY ANTERIOR APPROACH;  Surgeon: Mcarthur Rossetti, MD;  Location: [facility];  Service: Orthopedics;  Laterality: Right;   HPI:  84  year old female admitted 11/11/17. PMh significant for lung CA with brain mets. MRI = multifocal LMCA infarcts   Assessment / Plan / Recommendation Clinical Impression  Pt presents with significant receptive and expressive aphasia.   RECEPTIVE LANGUAGE: Pt responses to simple yes/no questions are limited to "no" most of the time. Pt is able to follow 1- and 2-step verbal commands, but was unable to demonstrate ability to follow 3-step verbal commands.  Gestures increase accuracy of following more complex directions. Pt accurate for simple right/left questions, but has difficulty with higher level requests. ID of body parts 60% accurate. ID of objects around the room - 100%. Pt exhibits no comprehension of sentence level written information.   EXPRESSIVE:  Pt able to say her name and count 1-10, but when asked for months or days, her response is perseverative on counting. Months were 83% accurate given "January", but she was unable to provide days of the week, with SLP providing "Monday", then pt responded with "tuesday, Threse, 4, 5..._. Pt able to repeat words and sentences without difficulty. Pt 82% accurate for sentence completion task, but was unable to name objects with greater accuracy than 10%, with neologisms and perseveration noted. Responsive naming was 60% accurate, and she was unable to provide a verbal list of animals or fruits.   Family was educated briefly on facilitating communication with pt - keep instructions and questions simple, and use gestures to improve comprehension. Son reports pt is being [facility] home today. Home health ST was recommended to MD.  SLP Assessment  SLP Recommendation/Assessment: All further Speech Language Pathology  needs can be addressed in the next venue of care SLP Visit Diagnosis: Aphasia (R47.01)    Follow Up Recommendations  Home health SLP    Frequency and Duration   per home health SLP        SLP Evaluation Cognition  Overall Cognitive Status:  Difficult to assess(significant receptive and expressive aphasia) Arousal/Alertness: Awake/alert Orientation Level: Oriented to person;Oriented to place;Oriented to time;Disoriented to situation       Comprehension  Auditory Comprehension Overall Auditory Comprehension: Impaired Yes/No Questions: Impaired(no is dominant answer) Basic Biographical Questions: 0-25% accurate Basic Immediate Environment Questions: 0-24% accurate Commands: Impaired One Step Basic Commands: 75-100% accurate Two Step Basic Commands: 75-100% accurate Multistep Basic Commands: 25-49% accurate Conversation: Simple Visual Recognition/Discrimination Discrimination: Not tested Reading Comprehension Reading Status: Impaired Sentence Level: Impaired    Expression Expression Primary Mode of Expression: Verbal Verbal Expression Overall Verbal Expression: Impaired Initiation: No impairment Automatic Speech: Name;Counting(perseverates on counting when asked to provide DAW and MOY) Level of Generative/Spontaneous Verbalization: Phrase Repetition: No impairment Naming: Impairment Responsive: 51-75% accurate Confrontation: Impaired Convergent: 0-24% accurate Divergent: 0-24% accurate Verbal Errors: Perseveration;Phonemic paraphasias;Aware of errors Written Expression Dominant Hand: Right Written Expression: Not tested   Oral / Motor  Oral Motor/Sensory Function Overall Oral Motor/Sensory Function: Within functional limits Motor Speech Overall Motor Speech: Other (comment)(not fully assessed at this time) Intelligibility: Intelligibility reduced Word: 75-100% accurate Phrase: 75-100% accurate Sentence: 75-100% accurate Conversation: 75-100% accurate   GO                   Celia B. Quentin Ore [facility], CCC-SLP Speech Language Pathologist [phone]  Shonna Chock 11/13/2017, 11:51 AM

## 2017-11-13 NOTE — Progress Notes (Signed)
Physical Therapy Treatment Patient Details Name: [name] MRN: [id number] DOB: [birth year] Today's Date: 11/13/2017    History of Present Illness 84 y.o. female with a history of lung cancer with brain metastasis who presents with transient episode of speech difficulty.  MRI was obtained in the emergency department which demonstrates multifocal left MCA distribution ischemic punctate infarcts.  There is also a cerebellar area concerning for metastasis versus subacute infarct. Since coming to hospital pt experienced R facial droop, slurred speech and difficulty with word finding.     PT Comments    Pt progressed RW training and kept close proximity with minor cuing. Pt practiced ambulation with head turns for functional balance. Pt progressing towards her goals and d/c plans remain appropriate.    Follow Up Recommendations  Home health PT;Supervision/Assistance - 24 hour     Equipment Recommendations  None recommended by PT    Recommendations for Other Services OT consult     Precautions / Restrictions Precautions Precautions: None Restrictions Weight Bearing Restrictions: No    Mobility  Bed Mobility Overal bed mobility: Modified Independent             General bed mobility comments: use of bed rail to come to seated EoB  Transfers Overall transfer level: Modified independent Equipment used: Rolling walker (2 wheeled)             General transfer comment: strong, steady power up in RW   Ambulation/Gait Ambulation/Gait assistance: Min guard Ambulation Distance (Feet): 250 Feet Assistive device: Rolling walker (2 wheeled) Gait Pattern/deviations: Step-through pattern;Shuffle;Decreased dorsiflexion - right Gait velocity: slowed Gait velocity interpretation: Below normal speed for age/gender General Gait Details: min guard for safety, steady gait with no LoB, as ambulation progressed pt with increasing difficulty with R foot dorsiflexion, able to correct  with vc for increased knee flexion to clear foot   Stairs            Wheelchair Mobility    Modified Rankin (Stroke Patients Only) Modified Rankin (Stroke Patients Only) Pre-Morbid Rankin Score: No symptoms Modified Rankin: Moderately severe disability     Balance Overall balance assessment: Needs assistance Sitting-balance support: Feet supported;No upper extremity supported Sitting balance-Leahy Scale: Normal     Standing balance support: No upper extremity supported Standing balance-Leahy Scale: Good                 High Level Balance Comments: slowed gait with head turns and minor misstep with no LoB            Cognition Arousal/Alertness: Awake/alert Behavior During Therapy: WFL for tasks assessed/performed Overall Cognitive Status: Within Functional Limits for tasks assessed                                           General Comments General comments (skin integrity, edema, etc.): Pt family in room during session      Pertinent Vitals/Pain Pain Assessment: No/denies pain    Home Living     Available Help at Discharge: Family;Available 24 hours/day Type of Home: House                  PT Goals (current goals can now be found in the care plan section) Acute Rehab PT Goals Patient Stated Goal: go home PT Goal Formulation: With patient Time For Goal Achievement: 11/26/17 Potential to Achieve Goals: Good Progress towards PT goals:  Progressing toward goals    Frequency    Min 4X/week      PT Plan Current plan remains appropriate       AM-PAC PT "6 Clicks" Daily Activity  Outcome Measure  Difficulty turning over in bed (including adjusting bedclothes, sheets and blankets)?: A Little Difficulty moving from lying on back to sitting on the side of the bed? : A Little Difficulty sitting down on and standing up from a chair with arms (e.g., wheelchair, bedside commode, etc,.)?: A Little Help needed moving to and  from a bed to chair (including a wheelchair)?: None Help needed walking in hospital room?: None Help needed climbing 3-5 steps with a railing? : A Little 6 Click Score: 20    End of Session Equipment Utilized During Treatment: Gait belt Activity Tolerance: Patient tolerated treatment well Patient left: in bed;with family/visitor present;with call bell/phone within reach Nurse Communication: Mobility status PT Visit Diagnosis: Muscle weakness (generalized) (M62.81);Other symptoms and signs involving the nervous system (R29.898);Other abnormalities of gait and mobility (R26.89)     Time: 4081-4481 PT Time Calculation (min) (ACUTE ONLY): 16 min  Charges:  $Gait Training: 8-22 mins                    G Codes:       Samiel Peel B. Migdalia Dk PT, DPT Acute Rehabilitation  [phone] Pager [phone]     [place] 11/13/2017, 1:11 PM

## 2017-11-13 NOTE — Discharge Summary (Signed)
Physician Discharge Summary  [name]  ZOX:[id number]  DOB: [birth year]  DOA: 11/11/2017 PCP: Derinda Late, MD  Admit date: 11/11/2017 Discharge date: 11/13/2017  Admitted From: Home  Disposition:  Home   Recommendations for Outpatient Follow-up:  1. Follow up with PCP in 1-2 weeks 2. Follow up with oncology  3. Please obtain BMP/CBC in one week 4. 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  Upon ED evaluation MRI showed multiple lobe infarct.  Neurology was consulted, patient was not a candidate for TPA.  Also UA was grossly abnormal therefore started treatment for UTI patient was admitted for stroke and further evaluation.  Patient was evaluated by physical therapy who recommended home health PT.  Patient has been clinically stable and will be discharged home to follow-up with PCP and neurology as an outpatient.  Subjective: Patient seen and examined, she has no complaints today. Still with some dysarthria.  No acute events overnight.  Tolerating diet well ambulating without any issues.  Discharge Diagnoses/Hospital Course:  Acute CVA Felt to be related to brain mets and hypercoag state from lung Ca  Residual deficit - dysarthria  MRI shows multiple location infarcts, with residual brain mets.  ECHO unremarkable, A1C 5.5, Carotid doppler 1-39% stenosis b/l LDL 83, started on Lipitor 10 mg daily Resume Xarelto, ASA 325 added.  Follow-up with neurology as an outpatient  UTI - UA grossly abnormal with nitrates positive No urine culture was collected  prior antibiotics Patient was started on Cipro will continue treatment for 3 days as this seems to be uncomplicated UTI, patient is allergic to penicillin. 1 more day of abx.  Follow up with PCP   Adenocarcinoma of left lung, stage IV Outpatient follow-up with oncology  MRI revealed new right cerebellar folia concerning for new metastases Resume Gilotrif   Hypertension Allowed permissive hypertension for 24-48 hours BP currently stable, can resume metoprolol on 11/14/17  All other chronic medical condition were stable during the hospitalization.  Patient was seen by physical therapy, recommending home health PT  On the day of the discharge the patient's vitals were stable, and no other acute medical condition were reported by patient. the patient was felt safe to be discharge to home   Discharge Instructions  You were cared for by a hospitalist during your hospital stay. If you have any questions about your discharge medications or the care you received while you were in the hospital after you are discharged, you can call the unit and asked to speak with the hospitalist on call if the hospitalist that took care of you is not available. Once you are discharged, your primary care physician will handle any further medical issues. Please note that NO REFILLS for any discharge medications will be authorized once you are discharged, as it is imperative that you return to your primary care physician (or establish a relationship with a primary care physician if you do not have one) for your aftercare needs so that they can reassess your need for medications and monitor your lab values.  Discharge Instructions    Ambulatory referral to Neurology  Complete by:  As directed    An appointment is requested in approximately: {Week(s):6 weeks   Call MD for:  difficulty breathing, headache or visual disturbances   Complete by:  As directed    Call MD for:  extreme fatigue   Complete by:  As directed     Call MD for:  hives   Complete by:  As directed    Call MD for:  persistant dizziness or light-headedness   Complete by:  As directed    Call MD for:  persistant nausea and vomiting   Complete by:  As directed    Call MD for:  redness, tenderness, or signs of infection (pain, swelling, redness, odor or green/yellow discharge around incision site)   Complete by:  As directed    Call MD for:  severe uncontrolled pain   Complete by:  As directed    Call MD for:  temperature >100.4   Complete by:  As directed    Diet - low sodium heart healthy   Complete by:  As directed    Increase activity slowly   Complete by:  As directed      Allergies as of 11/13/2017      Reactions   Penicillins Rash   Has patient had a PCN reaction causing immediate rash, facial/tongue/throat swelling, SOB or lightheadedness with hypotension: Unknown Has patient had a PCN reaction causing severe rash involving mucus membranes or skin necrosis: Unknown Has patient had a PCN reaction that required hospitalization: Unknown Has patient had a PCN reaction occurring within the last 10 years: Unknown If all of the above answers are "NO", then may proceed with Cephalosporin use.      Medication List    STOP taking these medications   prochlorperazine 10 MG tablet Commonly known as:  COMPAZINE     TAKE these medications   afatinib dimaleate 30 MG tablet Commonly known as:  GILOTRIF Take 1 tablet (30 mg total) by mouth daily. Take on an empty stomach 1hr before or 2hrs after meals.   aspirin 325 MG tablet Take 1 tablet (325 mg total) by mouth daily. Start taking on:  11/14/2017   atorvastatin 10 MG tablet Commonly known as:  LIPITOR Take 1 tablet (10 mg total) by mouth daily at 6 PM.   CALCIUM CARBONATE PO Take 1 tablet by mouth daily.   celecoxib 200 MG capsule Commonly known as:  CELEBREX take 1 capsule by mouth once daily if needed for NECK PAIN OR ARTHRITIS   ciprofloxacin 250 MG tablet Commonly  known as:  CIPRO Take 1 tablet (250 mg total) by mouth 2 (two) times daily for 1 day.   gabapentin 100 MG capsule Commonly known as:  NEURONTIN Take 300 mg by mouth at bedtime. 3 tablets at bedtime   metoprolol succinate 25 MG 24 hr tablet Commonly known as:  TOPROL-XL Take 0.5 tablets (12.5 mg total) by mouth daily. Start taking on:  11/14/2017   multivitamin with minerals Tabs tablet Take 1 tablet by mouth daily.   rivaroxaban 20 MG Tabs tablet Commonly known as:  XARELTO Take 20 mg by mouth daily with supper.      Follow-up Information    Marcial Pacas, MD. Schedule an appointment as soon as possible for a visit in 6 week(s).   Specialty:  Neurology Contact information: [address] [place] [address] [phone]        Derinda Late, MD. Schedule an appointment as soon as possible for a  visit in 1 week(s).   Specialty:  Family Medicine Why:  Hospital follow up  Contact information: [place] [address] [phone]          Allergies  Allergen Reactions  . Penicillins Rash    Has patient had a PCN reaction causing immediate rash, facial/tongue/throat swelling, SOB or lightheadedness with hypotension: Unknown Has patient had a PCN reaction causing severe rash involving mucus membranes or skin necrosis: Unknown Has patient had a PCN reaction that required hospitalization: Unknown Has patient had a PCN reaction occurring within the last 10 years: Unknown If all of the above answers are "NO", then may proceed with Cephalosporin use.     Consultations:  Neurology   Procedures/Studies: Mr Jeri Cos And Wo Contrast  Result Date: 11/11/2017 CLINICAL DATA:  Intermittent confusion and aphasia today. Dizziness beginning this afternoon. History of lung cancer with brain metastasis. EXAM: MRI HEAD WITHOUT AND WITH CONTRAST TECHNIQUE: Multiplanar, multiecho pulse sequences of the brain and surrounding structures were obtained without  and with intravenous contrast. CONTRAST:  72mL MULTIHANCE GADOBENATE DIMEGLUMINE 529 MG/ML IV SOLN COMPARISON:  MRI of the head August 06, 2017 FINDINGS: INTRACRANIAL CONTENTS: 3 foci of reduced diffusion LEFT temporal occipital lobe measuring to 4 mm, largest lesion demonstrates low ADC values. No susceptibility artifact to suggest hemorrhage. New curvilinear enhancement RIGHT cerebellar folia measuring 2 1 cm. Nonenhancing 4 mm T2 hyperintensity RIGHT posterior cerebellum new from prior study. Faint linear enhancement LEFT frontal lobe cortex corresponding to old treated metastasis. Old small LEFT inferior cerebellar infarct present previously. Ventricles and sulci are normal for patient's age. No midline shift, mass effect. No abnormal extra-axial fluid collections or extra-axial enhancement. VASCULAR: Normal major intracranial vascular flow voids present at skull base. SKULL AND UPPER CERVICAL SPINE: Expanded partially empty sella. No suspicious calvarial bone marrow signal. Craniocervical junction maintained. SINUSES/ORBITS: The mastoid air-cells and included paranasal sinuses are well-aerated.The included ocular globes and orbital contents are non-suspicious. OTHER: None. IMPRESSION: 1. Three acute LEFT temporal occipital lobe infarcts measuring to 4 mm. 2. New RIGHT cerebellar folia enhancement concerning for metastasis though, subacute infarct could have a similar appearance. Additional new nonacute small RIGHT cerebellar infarct. Old small LEFT cerebellar infarct. 3. Tiny residual treated LEFT frontal lobe metastasis. Electronically Signed   By: Elon Alas M.D.   On: 11/11/2017 22:08   ECHO  ------------------------------------------------------------------- Study Conclusions  - Left ventricle: The cavity size was normal. Wall thickness was   increased in a pattern of mild LVH. Systolic function was   vigorous. The estimated ejection fraction was in the range of 70%   to 75%. Wall motion  was normal; there were no regional wall   motion abnormalities. Doppler parameters are consistent with   abnormal left ventricular relaxation (grade 1 diastolic   dysfunction). - Mitral valve: Moderately calcified annulus. - Left atrium: The atrium was mildly dilated.  Impressions:  - No cardiac source of emboli was indentified  Discharge Exam: Vitals:   11/13/17 0551 11/13/17 0944  BP: 107/68 (!) 113/57  Pulse: 70 81  Resp: 16 20  Temp: 98 F (36.7 C) 97.8 F (36.6 C)  SpO2: 100% 98%   Vitals:   11/12/17 2136 11/13/17 0202 11/13/17 0551 11/13/17 0944  BP: 132/78 129/61 107/68 (!) 113/57  Pulse: 81 68 70 81  Resp: 18 16 16 20   Temp: 98.4 F (36.9 C) 97.7 F (36.5 C) 98 F (36.7 C) 97.8 F (36.6 C)  TempSrc: Oral Oral Oral  Oral  SpO2: 100% 100% 100% 98%  Weight:      Height:        General: Pt is alert, awake, not in acute distress Cardiovascular: RRR, S1/S2 +, no rubs, no gallops Respiratory: CTA bilaterally, no wheezing, no rhonchi Abdominal: Soft, NT, ND, bowel sounds + Extremities: Strength 5/5, no edema  Neuro: Dysarthria, Mild nasolabial flattening, AAOx 3   The results of significant diagnostics from this hospitalization (including imaging, microbiology, ancillary and laboratory) are listed below for reference.     Microbiology: No results found for this or any previous visit (from the past 240 hour(s)).   Labs: BNP (last 3 results) No results for input(s): BNP in the last 8760 hours. Basic Metabolic Panel: Recent Labs  Lab 11/09/17 1251 11/11/17 1933 11/12/17 0808  NA 138 137 139  K 4.5 3.9 3.8  CL 104 103 107  CO2 26 25 22   GLUCOSE 88 112* 114*  BUN 18 20 13   CREATININE  --  1.04* 0.99  CALCIUM 9.3 9.3 8.9   Liver Function Tests: Recent Labs  Lab 11/09/17 1251 11/11/17 1933  AST 40* 45*  ALT 32 35  ALKPHOS 369* 371*  BILITOT 0.6 0.7  PROT 7.7 7.8  ALBUMIN 3.4* 3.6   No results for input(s): LIPASE, AMYLASE in the last 168  hours. No results for input(s): AMMONIA in the last 168 hours. CBC: Recent Labs  Lab 11/09/17 1251 11/11/17 1933  WBC 6.3 6.9  NEUTROABS 3.3 4.1  HGB  --  13.9  HCT 39.9 40.0  MCV 92.8 89.9  PLT 196 200   Cardiac Enzymes: No results for input(s): CKTOTAL, CKMB, CKMBINDEX, TROPONINI in the last 168 hours. BNP: Invalid input(s): POCBNP CBG: Recent Labs  Lab 11/11/17 1943  GLUCAP 114*   D-Dimer No results for input(s): DDIMER in the last 72 hours. Hgb A1c Recent Labs    11/12/17 0232  HGBA1C 5.5   Lipid Profile Recent Labs    11/12/17 0232  CHOL 157  HDL 64  LDLCALC 83  TRIG 49  CHOLHDL 2.5   Thyroid function studies No results for input(s): TSH, T4TOTAL, T3FREE, THYROIDAB in the last 72 hours.  Invalid input(s): FREET3 Anemia work up No results for input(s): VITAMINB12, FOLATE, FERRITIN, TIBC, IRON, RETICCTPCT in the last 72 hours. Urinalysis    Component Value Date/Time   COLORURINE YELLOW 11/11/2017 2048   APPEARANCEUR CLEAR 11/11/2017 2048   LABSPEC 1.008 11/11/2017 2048   PHURINE 5.0 11/11/2017 2048   GLUCOSEU NEGATIVE 11/11/2017 2048   HGBUR SMALL (A) 11/11/2017 2048   BILIRUBINUR NEGATIVE 11/11/2017 2048   KETONESUR NEGATIVE 11/11/2017 2048   PROTEINUR NEGATIVE 11/11/2017 2048   UROBILINOGEN 0.2 02/17/2013 1340   NITRITE POSITIVE (A) 11/11/2017 2048   LEUKOCYTESUR SMALL (A) 11/11/2017 2048   Sepsis Labs Invalid input(s): PROCALCITONIN,  WBC,  LACTICIDVEN Microbiology No results found for this or any previous visit (from the past 240 hour(s)).   Time coordinating discharge: 35 minutes  SIGNED:  Chipper Oman, MD  [facility] 11/13/2017, 11:10 AM  Pager please text page via  [email or web address]

## 2017-11-13 NOTE — Care Management Note (Signed)
Case Management Note  Patient Details  Name: [name] MRN: [id number] Date of Birth: 23-Sep-1928  Subjective/Objective:                    Action/Plan: Pt discharging home with [facility] services. CM met with the patient and her family and provided choice of [place] agencies. They selected [facility]. Mary with [facility] notified and accepted the referral.  Pt son Richardson Landry) is going to provide 24/7 supervision at home. His contact numbers are: [phone] and home [phone]. Family to provide transportation home.  Expected Discharge Date:  11/13/17               Expected Discharge Plan:  [place]  In-House Referral:     Discharge planning Services  CM Consult  Post Acute Care Choice:  Home Health Choice offered to:  Patient, Adult Children  DME Arranged:    DME Agency:     HH Arranged:  PT, OT, Speech Therapy [place] Agency:  [facility] (now [facility])  Status of Service:  Completed, signed off  If discussed at [place] of Stay Meetings, dates discussed:    Additional Comments:  Pollie Friar, RN 11/13/2017, 12:02 PM

## 2017-11-13 NOTE — Progress Notes (Signed)
Patient for discharge home accompanied by her family with no apparent distress noted. Medications and discharge instructions explained to the patient's son, Remo Lipps. He verbalized understanding. Copies given to him. IV saline lock and tele pack removed. CCMD notified.

## 2017-11-13 NOTE — Progress Notes (Signed)
Called Annie Main (patient's son) and notified him that I missed to give him the prescriptions of his mother for ciprofloxacin, atorvastatin and aspirin. He said the patient already has aspirin at home. Called in the medications cipro and atorvastatin to patient's pharmacy which is [facility] in [place] location.

## 2017-11-13 NOTE — Progress Notes (Signed)
Received a call from CCMD that patient had 5 beats of wide complex a while ago. Patient asymptomatic. Notified Dr. Quincy Simmonds.

## 2017-11-13 NOTE — Evaluation (Signed)
Occupational Therapy Evaluation Patient Details Name: [name] MRN: [id number] DOB: 1927-12-25 Today's Date: 11/13/2017    History of Present Illness 84 y.o. female with a history of lung cancer with brain metastasis who presents with transient episode of speech difficulty.  MRI was obtained in the emergency department which demonstrates multifocal left MCA distribution ischemic punctate infarcts.  There is also a cerebellar area concerning for metastasis versus subacute infarct. Since coming to hospital pt experienced R facial droop, slurred speech and difficulty with word finding.    Clinical Impression   Pt does not require any physical assist for ADLs and ADL mobility, however has deficits in memory, processing, comprehension and sequencing and requires multimodal cues to initiate and complete ADL tasks. Family present and educated on supervision/safety of pt at home. Pt to d/c home with family this afternoon.    Follow Up Recommendations  Home health OT;Supervision/Assistance - 24 hour    Equipment Recommendations  None recommended by OT    Recommendations for Other Services       Precautions / Restrictions Precautions Precautions: None Restrictions Weight Bearing Restrictions: No      Mobility Bed Mobility Overal bed mobility: Modified Independent             General bed mobility comments: use of bed rail to come to seated EoB  Transfers Overall transfer level: Modified independent Equipment used: Rolling walker (2 wheeled)             General transfer comment: strong, steady power up in RW     Balance Overall balance assessment: Needs assistance Sitting-balance support: Feet supported;No upper extremity supported Sitting balance-Leahy Scale: Normal     Standing balance support: No upper extremity supported Standing balance-Leahy Scale: Good                 High Level Balance Comments: slowed gait with head turns and minor misstep with no  LoB           ADL either performed or assessed with clinical judgement   ADL Overall ADL's : Needs assistance/impaired                                       General ADL Comments: requires supervision with cues for correct execution and completion of tasks     Vision Baseline Vision/History: Wears glasses Wears Glasses: Reading only Patient Visual Report: No change from baseline       Perception     Praxis      Pertinent Vitals/Pain Pain Assessment: No/denies pain     Hand Dominance Right   Extremity/Trunk Assessment Upper Extremity Assessment Upper Extremity Assessment: RUE deficits/detail RUE Deficits / Details: decreased strength, 3+/5    Lower Extremity Assessment Lower Extremity Assessment: Defer to PT evaluation   Cervical / Trunk Assessment Cervical / Trunk Assessment: Normal   Communication Communication Communication: No difficulties;Expressive difficulties   Cognition Arousal/Alertness: Awake/alert Behavior During Therapy: WFL for tasks assessed/performed Overall Cognitive Status: Impaired/Different from baseline Area of Impairment: Orientation;Memory;Following commands;Problem solving;Awareness;Safety/judgement                 Orientation Level: Disoriented to;Place;Time   Memory: Decreased short-term memory Following Commands: Follows one step commands inconsistently Safety/Judgement: Decreased awareness of safety;Decreased awareness of deficits   Problem Solving: Slow processing;Decreased initiation;Difficulty sequencing;Requires verbal cues;Requires tactile cues     General Comments  Pt family in room during  session    Exercises     Shoulder Instructions      Home Living Family/patient expects to be discharged to:: Private residence Living Arrangements: Alone Available Help at Discharge: Family;Available 24 hours/day Type of Home: House Home Access: Level entry     Home Layout: One level     Bathroom  Shower/Tub: Occupational psychologist: Standard     Home Equipment: Environmental consultant - 2 wheels;Bedside commode;Shower seat;Grab bars - toilet;Grab bars - tub/shower;Hand held shower head;Cane - single point      Lives With: Family    Prior Functioning/Environment Level of Independence: Independent with assistive device(s)        Comments: ambulates with SPC, drives, independent with ADLs, and iADLs.         OT Problem List: Decreased activity tolerance;Decreased knowledge of use of DME or AE;Decreased knowledge of precautions;Decreased safety awareness;Impaired balance (sitting and/or standing)      OT Treatment/Interventions:      OT Goals(Current goals can be found in the care plan section) Acute Rehab OT Goals Patient Stated Goal: go home OT Goal Formulation: With patient/family  OT Frequency:     Barriers to D/C:    no barriers       Co-evaluation              AM-PAC PT "6 Clicks" Daily Activity     Outcome Measure Help from another person eating meals?: None Help from another person taking care of personal grooming?: A Little Help from another person toileting, which includes using toliet, bedpan, or urinal?: A Little Help from another person bathing (including washing, rinsing, drying)?: A Little Help from another person to put on and taking off regular upper body clothing?: A Little Help from another person to put on and taking off regular lower body clothing?: A Little 6 Click Score: 19   End of Session Equipment Utilized During Treatment: Gait belt  Activity Tolerance: Patient tolerated treatment well Patient left: in bed;with call bell/phone within reach;with bed alarm set  OT Visit Diagnosis: Unsteadiness on feet (R26.81);Muscle weakness (generalized) (M62.81);Other symptoms and signs involving cognitive function                Time: 2202-5427 OT Time Calculation (min): 30 min Charges:  OT General Charges $OT Visit: 1 Visit OT Evaluation $OT  Eval Moderate Complexity: 1 Mod OT Treatments $Therapeutic Activity: 8-22 mins G-Codes: OT G-codes **NOT FOR INPATIENT CLASS** Functional Assessment Tool Used: AM-PAC 6 Clicks Daily Activity     Britt Bottom 11/13/2017, 2:46 PM

## 2017-11-16 ENCOUNTER — Telehealth: Payer: Self-pay | Admitting: Medical Oncology

## 2017-11-16 ENCOUNTER — Telehealth: Payer: Self-pay | Admitting: Nurse Practitioner

## 2017-11-16 ENCOUNTER — Other Ambulatory Visit: Payer: Self-pay

## 2017-11-16 ENCOUNTER — Telehealth: Payer: Self-pay

## 2017-11-16 NOTE — Telephone Encounter (Signed)
I reviewed MRI brain on Nov 11 2017:  IMPRESSION: 1. Three acute LEFT temporal occipital lobe infarcts measuring to 4 mm. 2. New RIGHT cerebellar folia enhancement concerning for metastasis though, subacute infarct could have a similar appearance. Additional new nonacute small RIGHT cerebellar infarct. Old small LEFT cerebellar infarct. 3. Tiny residual treated LEFT frontal lobe metastasis.  Hg 13.9 on Nov 11 2017.  One bloody stool on xelrato and asa, appt is moved to Nov 19 2017

## 2017-11-16 NOTE — Patient Outreach (Signed)
Section [facility]) Care Management  11/16/2017  [name] Mar 03, 1928 [id number]   EMMI:  Referral date: 11/16/17 Referral source: EMMI stroke red alert Referral reason: Feeling worse overall and new problems: YES Day #1  Telephone call to patient's son and designated party release, [name].  Son states patient is unable to speak.  Per medical documentation/ physician discharge summary patient has dyarthia.   RNCM discussed EMMI stroke program with son. Son states patient comes and goes. Son states patient is unable to speak but most of the time is aware cognitively. Son states when patient gets up in the morning she is a little more confused but after she drinks and eats breakfast she is more aware of what is going on. Son states she is able to get up and move about with her walker. States patient is also eating well.  Son denies any new symptoms that patient is having. Son states patient has an upcoming appointment with the oncologist. Son state patients primary MD visit was rescheduled for 11/27/17.  Son states he is going to call and set up appointment for patient with the neurologist.  Son states patient has all of her medications and is taking them as prescribed. Son states patient only has 2 pills left for her antibiotic.  Son states patient has had difficulty having a Bowel movement. Son states he called the oncologist office and he was told to get over the counter magnesium sulfate and miralax.  RNCM  Reviewed signs/ symptoms of stroke with patient. Advised to call 911 for stroke like symptoms.  Son denies any additional concerns at this time. Son verbally agreed to ongoing automated EMMI calls.  RNCM advised patient to notify MD of any changes in condition prior to scheduled appointment. RNCM verified patient aware of 911 services for urgent/ emergent needs.,   ASSESSMENT: Per patients discharge summary 11/13/17 Patient seen and examined, she has no complaints  today. Still with some dysarthria.  No acute events overnight.  Tolerating diet well ambulating without any issues.   PLAN: RNCM will refer patient to care management assistant to close due to patient being assessed and having no further needs.   Quinn Plowman RN,BSN,CCM [facility] Telephonic  [phone]

## 2017-11-16 NOTE — Telephone Encounter (Signed)
Pt was just discharged from hospital 01-25, pt pcp put pt on Xarelto At the hospital pt was told to also take 1 aspirin a day. Pt son(on DPR) has called with concern that pt had a bloody discharge since taking the two Xarelto and asprin together.  He is asking for a call re: concern of pt taking the two

## 2017-11-16 NOTE — Telephone Encounter (Signed)
Pt is at home now recovering from stroke. Son reports Constipation-Pt has  not had a BM since Friday .  She drank prune juice with no results. I told him to get MOM to start with one dose and get her on Miralax daily. If MOM does not work give mag citrate 1/2 bottle and if no BM give the other half . I told him to call back wed if no results.

## 2017-11-16 NOTE — Telephone Encounter (Signed)
She is taking one tablet of Xarelto 20mg  and one tablet of aspirin 325mg  daily, as directed by the hospital.  She had one episode of bloody stool on 11/14/17 but no further episodes.  He would like her to be seen sooner.  Her appt has been moved to 11/19/17.  He will continue her medications as prescribed until she sees Dr. Krista Blue.

## 2017-11-16 NOTE — Progress Notes (Addendum)
[name] recurrent metastatic Stage IA NSCLC, adenocarcinoma of the left upper lobe with disease in the liver and brain completed radiation 04-21-17, review 08-06-17 MRI brain, FU.  Headache:No Had a left sided stroke 08-09-18 stayed in the [facility] two days. Dr. Silva Zapata treated her while in the hospital has follow up appointment today to review medications on aspirin and xarelto. Pain:No Dizziness:No Nausea/vomiting:No Ringing in ears:No Visual changes (Blurred/ diplopia double vision,blind spots, and peripheral vsion changes):Reading glasses Fatigue:Mild fatigue takes a nap Cognitive changes:Alert and oriented x 3. was disoriented the day she had the stroke and struggled over the past week with short and long term memory. Had problems speaking knew what she wanted to say. Respiratory issues:No issues Weight: Wt Readings from Last 3 Encounters:  11/18/17 160 lb 6.4 oz (72.8 kg)  11/12/17 161 lb 12.8 oz (73.4 kg)  11/09/17 163 lb 4.8 oz (74.1 kg)  BP 127/79 (BP Location: Right Arm, Patient Position: Sitting, Cuff Size: Normal)   Pulse 84   Temp 97.8 F (36.6 C) (Oral)   Resp 18   Ht 5' 7" (1.702 m)   Wt 160 lb 6.4 oz (72.8 kg)   SpO2 100%   BMI 25.12 kg/m  10-08-17 Saw Dr. Mohamed GILOTRIF 30 mg p.o. 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   

## 2017-11-18 ENCOUNTER — Ambulatory Visit: Payer: Medicare Other | Admitting: Neurology

## 2017-11-18 ENCOUNTER — Encounter: Payer: Self-pay | Admitting: Radiation Oncology

## 2017-11-18 ENCOUNTER — Other Ambulatory Visit: Payer: Self-pay

## 2017-11-18 ENCOUNTER — Ambulatory Visit
Admission: RE | Admit: 2017-11-18 | Discharge: 2017-11-18 | Disposition: A | Discharge status: Home / Self Care | Payer: Medicare Other | Source: Ambulatory Visit | Attending: Radiation Oncology | Admitting: Radiation Oncology

## 2017-11-18 ENCOUNTER — Encounter: Payer: Self-pay | Admitting: Neurology

## 2017-11-18 VITALS — BP 127/79 | HR 84 | Temp 97.8°F | Resp 18 | Ht 67.0 in | Wt 160.4 lb

## 2017-11-18 VITALS — BP 128/79 | HR 74 | Ht 67.0 in | Wt 158.0 lb

## 2017-11-18 DIAGNOSIS — Z86010 Personal history of colonic polyps: Secondary | ICD-10-CM | POA: Diagnosis not present

## 2017-11-18 DIAGNOSIS — E785 Hyperlipidemia, unspecified: Secondary | ICD-10-CM | POA: Diagnosis not present

## 2017-11-18 DIAGNOSIS — Z96641 Presence of right artificial hip joint: Secondary | ICD-10-CM | POA: Insufficient documentation

## 2017-11-18 DIAGNOSIS — Z87891 Personal history of nicotine dependence: Secondary | ICD-10-CM | POA: Insufficient documentation

## 2017-11-18 DIAGNOSIS — Z7982 Long term (current) use of aspirin: Secondary | ICD-10-CM | POA: Insufficient documentation

## 2017-11-18 DIAGNOSIS — Z800 Family history of malignant neoplasm of digestive organs: Secondary | ICD-10-CM | POA: Insufficient documentation

## 2017-11-18 DIAGNOSIS — Z86711 Personal history of pulmonary embolism: Secondary | ICD-10-CM | POA: Insufficient documentation

## 2017-11-18 DIAGNOSIS — Z79899 Other long term (current) drug therapy: Secondary | ICD-10-CM | POA: Diagnosis not present

## 2017-11-18 DIAGNOSIS — C7931 Secondary malignant neoplasm of brain: Secondary | ICD-10-CM | POA: Diagnosis not present

## 2017-11-18 DIAGNOSIS — Z8673 Personal history of transient ischemic attack (TIA), and cerebral infarction without residual deficits: Secondary | ICD-10-CM | POA: Insufficient documentation

## 2017-11-18 DIAGNOSIS — Z923 Personal history of irradiation: Secondary | ICD-10-CM | POA: Insufficient documentation

## 2017-11-18 DIAGNOSIS — R410 Disorientation, unspecified: Secondary | ICD-10-CM | POA: Diagnosis not present

## 2017-11-18 DIAGNOSIS — Z905 Acquired absence of kidney: Secondary | ICD-10-CM | POA: Diagnosis not present

## 2017-11-18 DIAGNOSIS — Z85118 Personal history of other malignant neoplasm of bronchus and lung: Secondary | ICD-10-CM | POA: Insufficient documentation

## 2017-11-18 DIAGNOSIS — G629 Polyneuropathy, unspecified: Secondary | ICD-10-CM | POA: Diagnosis not present

## 2017-11-18 DIAGNOSIS — Z8249 Family history of ischemic heart disease and other diseases of the circulatory system: Secondary | ICD-10-CM | POA: Insufficient documentation

## 2017-11-18 DIAGNOSIS — Z7901 Long term (current) use of anticoagulants: Secondary | ICD-10-CM | POA: Insufficient documentation

## 2017-11-18 DIAGNOSIS — Z833 Family history of diabetes mellitus: Secondary | ICD-10-CM | POA: Diagnosis not present

## 2017-11-18 DIAGNOSIS — Z880 Allergy status to penicillin: Secondary | ICD-10-CM | POA: Diagnosis not present

## 2017-11-18 DIAGNOSIS — Z90710 Acquired absence of both cervix and uterus: Secondary | ICD-10-CM | POA: Diagnosis not present

## 2017-11-18 DIAGNOSIS — C3412 Malignant neoplasm of upper lobe, left bronchus or lung: Secondary | ICD-10-CM

## 2017-11-18 NOTE — Addendum Note (Signed)
Encounter addended by: Malena Edman, RN on: 11/18/2017 1:40 PM  Actions taken: Charge Capture section accepted

## 2017-11-18 NOTE — Progress Notes (Signed)
Radiation Oncology         [phone] ________________________________  Name: [name] MRN: [id number]  Date: 11/18/2017  DOB: [birth year]   Follow Up Note  CC: Derinda Late, MD  Curt Bears, MD  Diagnosis:   Recurrent metastatic Stage IA NSCLC, adenocarcinoma of the left upper lobe with disease in the liver and brain.     Interval Since Last Radiation: 6 months  04/21/17 SRS Treatment: 1. PTV1 left superior frontal gyrus received 20 Gy in 1 fraction  12/04/15-12/14/15 SBRT Treatment:  60 Gy in 5 fractions to a lesion in the left upper lobe  Narrative:  The patient returns today for routine follow-up. The patient has a history of Stage IA NSCLC, of the left upper lobe treated with SBRT in February 2017. She was found to have recurrent disease in the liver in the spring of 2018 and a staging scan revealed a 2-3 mm focus of enhancement concerning for disease in a left superior frontal gyrus. She completed SRS in July 2018 and had been doing well when she was last seen. She is currently on Gliotrif with Dr. Julien Nordmann for systemic management of her cancer. She was seen on 11/11/17 in the ED though with aphasia, and dizziness. A 1.5T MRI was performed without and with contrast revealing 3 foci of reduced diffusion in the left temporal/occipital lobe measuring 4 mm, and new enhancement in the cerebellar folia measuring 2.1 cm as well as a nonenhancing 4 mm area in the right posterior cerebellum.  These findings appear to be consistent with CVA, and a faint enhancement over the left frontal lobe cortex corresponding to treated disease as she had received in July.  No new areas of disease were present, and no active hemorrhage was appreciated.  She comes today for follow-up, and is planning to also be seen by neurology this afternoon.  She is on Xarelto and aspirin, and has been on Xarelto due to history of PE.                   On review of systems, the patient reports that she is doing well  overall considering what she has been through.  Her son accompanies her today, and also reports that her speech has become much more distinguishable, and intentional.  She is having less word finding than she did last week.  She denies any dizziness at this time.  She denies any chest pain, shortness of breath, cough, fevers, chills, night sweats, unintended weight changes. She denies any bowel or bladder disturbances, and denies abdominal pain, nausea or vomiting. She denies any new musculoskeletal or joint aches or pains, new skin lesions or concerns. A complete review of systems is obtained and is otherwise negative.   Past Medical History:  Past Medical History:  Diagnosis Date  . Adenocarcinoma of left lung, stage 4 ([place]) 03/18/2017  . Arthritis    Neck,shoulder  . Brain metastases ([place]) 04/06/2017  . Colonic polyp   . Difficulty sleeping   . Encounter for antineoplastic chemotherapy 04/06/2017  . History of radiation therapy 12/04/15-12/14/15   left upper lobe 60 Gy  . Hx of unilateral nephrectomy 1994   right   . Hyperlipemia   . Lung cancer ([place])   . Numbness in feet   . Peripheral neuropathy   . Personal history of PE (pulmonary embolism) 2011    Past Surgical History: Past Surgical History:  Procedure Laterality Date  . ABDOMINAL HYSTERECTOMY    . APPENDECTOMY    .  cataracts Bilateral 09/2014   removal  . CESAREAN SECTION    . NEPHRECTOMY  1993   RT -   . TOTAL HIP ARTHROPLASTY Right 02/25/2013   Procedure: RIGHT TOTAL HIP ARTHROPLASTY ANTERIOR APPROACH;  Surgeon: Mcarthur Rossetti, MD;  Location: [facility];  Service: Orthopedics;  Laterality: Right;    Social History:  Social History   Socioeconomic History  . Marital status: Widowed    Spouse name: Not on file  . Number of children: 2  . Years of education: masters  . Highest education level: Not on file  Social Needs  . Financial resource strain: Not on file  . Food insecurity - worry: Not on file  . Food  insecurity - inability: Not on file  . Transportation needs - medical: Not on file  . Transportation needs - non-medical: Not on file  Occupational History  . Occupation: Retired Pharmacist, hospital    Comment: retired  Tobacco Use  . Smoking status: Former Smoker    Packs/day: 0.25    Years: 20.00    Pack years: 5.00    Types: Cigarettes    Last attempt to quit: 10/20/1989    Years since quitting: 28.0  . Smokeless tobacco: Never Used  Substance and Sexual Activity  . Alcohol use: No    Alcohol/week: 0.0 oz  . Drug use: No  . Sexual activity: Not on file  Other Topics Concern  . Not on file  Social History Narrative   Patient lives at home alone . - Widow.   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   Caffeine- One cup daily.   The patient is widowed and lives in [place].  Her son accompanies her to her appointment today.  Family History: Family History  Problem Relation Age of Onset  . Hypertension Father   . Diabetes Father   . Hypertension Mother   . Colon cancer Sister      ALLERGIES:  is allergic to penicillins.  Meds: Current Outpatient Medications  Medication Sig Dispense Refill  . afatinib dimaleate (GILOTRIF) 30 MG tablet Take 1 tablet (30 mg total) by mouth daily. Take on an empty stomach 1hr before or 2hrs after meals. 30 tablet 2  . atorvastatin (LIPITOR) 10 MG tablet Take 1 tablet (10 mg total) by mouth daily at 6 PM. 30 tablet 0  . CALCIUM CARBONATE PO Take 1 tablet by mouth daily.    . celecoxib (CELEBREX) 200 MG capsule take 1 capsule by mouth once daily if needed for NECK PAIN OR ARTHRITIS  0  . gabapentin (NEURONTIN) 100 MG capsule Take 300 mg by mouth at bedtime. 3 tablets at bedtime    . metoprolol succinate (TOPROL-XL) 25 MG 24 hr tablet Take 0.5 tablets (12.5 mg total) by mouth daily. 45 tablet 3  . Multiple Vitamin (MULTIVITAMIN WITH MINERALS) TABS Take 1 tablet by mouth daily.    . rivaroxaban (XARELTO) 20 MG TABS tablet Take 20 mg by mouth daily  with supper.    Marland Kitchen aspirin 325 MG tablet Take 1 tablet (325 mg total) by mouth daily. 30 tablet 0   No current facility-administered medications for this encounter.     Physical Findings:  height is 5\' 7"  (1.702 m) and weight is 160 lb 6.4 oz (72.8 kg). Her oral temperature is 97.8 F (36.6 C). Her blood pressure is 127/79 and her pulse is 84. Her respiration is 18 and oxygen saturation is 100%.  Pain Assessment Pain Score: 0-No pain/10  In general this is a well appearing African American female in no acute distress. She's alert and oriented x4 and appropriate throughout the examination. Cardiopulmonary assessment is negative for acute distress and she exhibits normal effort.  She does have occasional difficulty with word finding during our discussion, but her speech is appropriate, and only mild slurring is noted.  There is very subtle droop with her mouth on the right side.  No drooling is noted in her face is otherwise symmetric along her forehead, and chin.  Lab Findings: Lab Results  Component Value Date   WBC 6.9 11/11/2017   HGB 13.9 11/11/2017   HCT 40.0 11/11/2017   MCV 89.9 11/11/2017   PLT 200 11/11/2017     Radiographic Findings: Mr Jeri Cos And Wo Contrast  Result Date: 11/11/2017 CLINICAL DATA:  Intermittent confusion and aphasia today. Dizziness beginning this afternoon. History of lung cancer with brain metastasis. EXAM: MRI HEAD WITHOUT AND WITH CONTRAST TECHNIQUE: Multiplanar, multiecho pulse sequences of the brain and surrounding structures were obtained without and with intravenous contrast. CONTRAST:  20mL MULTIHANCE GADOBENATE DIMEGLUMINE 529 MG/ML IV SOLN COMPARISON:  MRI of the head August 06, 2017 FINDINGS: INTRACRANIAL CONTENTS: 3 foci of reduced diffusion LEFT temporal occipital lobe measuring to 4 mm, largest lesion demonstrates low ADC values. No susceptibility artifact to suggest hemorrhage. New curvilinear enhancement RIGHT cerebellar folia measuring 2 1 cm.  Nonenhancing 4 mm T2 hyperintensity RIGHT posterior cerebellum new from prior study. Faint linear enhancement LEFT frontal lobe cortex corresponding to old treated metastasis. Old small LEFT inferior cerebellar infarct present previously. Ventricles and sulci are normal for patient's age. No midline shift, mass effect. No abnormal extra-axial fluid collections or extra-axial enhancement. VASCULAR: Normal major intracranial vascular flow voids present at skull base. SKULL AND UPPER CERVICAL SPINE: Expanded partially empty sella. No suspicious calvarial bone marrow signal. Craniocervical junction maintained. SINUSES/ORBITS: The mastoid air-cells and included paranasal sinuses are well-aerated.The included ocular globes and orbital contents are non-suspicious. OTHER: None. IMPRESSION: 1. Three acute LEFT temporal occipital lobe infarcts measuring to 4 mm. 2. New RIGHT cerebellar folia enhancement concerning for metastasis though, subacute infarct could have a similar appearance. Additional new nonacute small RIGHT cerebellar infarct. Old small LEFT cerebellar infarct. 3. Tiny residual treated LEFT frontal lobe metastasis. Electronically Signed   By: Elon Alas M.D.   On: 11/11/2017 22:08    Impression/Plan: 1. Recurrent metastatic Stage IA NSCLC, adenocarcinoma of the left upper lobe with disease in the liver and brain.    The patient appears to be recovering well from her recent episode of CVA.  We also discussed the findings from her imaging that confirms stability of her previously treated metastatic lesion.  No new areas are noted, and recommendations from multidisciplinary brain and spine conference were that she proceed with repeat 3T MRI scan with SRS protocol in 3 months time.  She will be following up with neurology as well today, and we appreciate their input as well.  We will follow along with their recommendations.  She is encouraged to call if she has any questions or concerns prior to her next  visit. 2.  Recent CVA.  The patient will be following with neurology as outlined above, she had questions regarding her regimen of Xarelto and aspirin, and I encouraged her to discuss this further at today's appointment this afternoon.  She is in agreement with this plan.       Carola Rhine, PAC

## 2017-11-18 NOTE — Progress Notes (Signed)
PATIENT: [name] DOB: 12-01-1927  Chief Complaint  Patient presents with  . Cerebrovascular Accident    She is here with her son, [name], for her hospital follow up from her [place]oke on 11/11/17.  She will be starting PT, OT and speech therapy.  She is here for early follow up due to concerns over taking both aspirin 325mg  and [name] 20mg  at the same time.       HISTORICAL  [name] is a 84 year old female, accompanied by her son [name], is here to follow-up with her most recent hospital discharge, initial evaluation was on November 18, 2017.  She was admitted to the hospital from January 23, to 25 2019 for acute onset of language difficulty, confusion, which started early morning, continue progressed over the next few hours. MRI of brain on November 11, 2017 showed 3 small acute left temporal occipital infarction, measuring up to 4 mm, right cerebellar folia enhancement, concerning for new metastatic lesion versus subacute infarction.  Tiny residual treated left frontal lobe metastatic lesion. Echocardiogram was unremarkable Ultrasound of carotid arteries showed less than 39% stenosis bilaterally LDL was 83, he was started on [name] 10 mg daily, She was already on chronic anticoagulation [name] with history of PE, aspirin 325 mg was added.  She complains of coughing up pink phlegm, there was a concern of increased bleeding with [name] and aspirin 325 mg.  I reviewed  her extensive oncology evaluation, she was diagnosed with left lung upper lobe non-small cell carcinoma, received radiation therapy December 04, 2015 to December 14, 2015. She developed left superior frontal gyri metastatic lesion, received 20 Gy radiation treatment in 1 fraction on April 21, 2017.  PET scan in June 0272, 3 hypermetabolic liver mental status, these was new since April 18, hypermetabolic mediastinal nodal metastasis, mild hypermetabolism in the site of treated left upper lobe pulmonary nodule,  suspicious for local tumor recurrence,  I saw her previously in 2014 for neuropathy, she had a past medical history of bilateral pulmonary emboli, on chronic anticoagulation, hyperlipidemia, history of right nephrotomy due to severe endometriosis, erosion of right kidney from endometriosis.  She complains of bilateral feet paresthesia since 2010, EMG nerve conduction study was normal in April 2014, nerve biopsy in January 2015 showed no significant pathology.  Extensive laboratory evaluations showed anemia hemoglobin of 10.5, otherwise no treatable etiology, including normal negative B12, CMP, RPR, protein electrophoresis.  She also had a history of right hip replacement in May 2014, she complains of bilateral feet paresthesia, restless leg symptoms, last visit was in 2016, there was a concern of iron deficiency, she was given prescription of gabapentin 100mg  3 times a day,   REVIEW OF SYSTEMS: Full 14 system review of systems performed and notable only for as above  ALLERGIES: Allergies  Allergen Reactions  . Penicillins Rash    Has patient had a PCN reaction causing immediate rash, facial/tongue/throat swelling, SOB or lightheadedness with hypotension: Unknown Has patient had a PCN reaction causing severe rash involving mucus membranes or skin necrosis: Unknown Has patient had a PCN reaction that required hospitalization: Unknown Has patient had a PCN reaction occurring within the last 10 years: Unknown If all of the above answers are "NO", then may proceed with Cephalosporin use.     HOME MEDICATIONS: Current Outpatient Medications  Medication Sig Dispense Refill  . afatinib dimaleate (GILOTRIF) 30 MG tablet Take 1 tablet (30 mg total) by mouth daily. Take on an empty stomach 1hr before or  2hrs after meals. 30 tablet 2  . aspirin 325 MG tablet Take 1 tablet (325 mg total) by mouth daily. 30 tablet 0  . atorvastatin ([name]) 10 MG tablet Take 1 tablet (10 mg total) by mouth daily at 6  PM. 30 tablet 0  . CALCIUM CARBONATE PO Take 1 tablet by mouth daily.    . celecoxib (CELEBREX) 200 MG capsule take 1 capsule by mouth once daily if needed for NECK PAIN OR ARTHRITIS  0  . gabapentin (NEURONTIN) 100 MG capsule Take 300 mg by mouth at bedtime. 3 tablets at bedtime    . metoprolol succinate (TOPROL-XL) 25 MG 24 hr tablet Take 0.5 tablets (12.5 mg total) by mouth daily. 45 tablet 3  . Multiple Vitamin (MULTIVITAMIN WITH MINERALS) TABS Take 1 tablet by mouth daily.    . rivaroxaban ([name]) 20 MG TABS tablet Take 20 mg by mouth daily with supper.     No current facility-administered medications for this visit.     PAST MEDICAL HISTORY: Past Medical History:  Diagnosis Date  . Adenocarcinoma of left lung, stage 4 ([place]) 03/18/2017  . Arthritis    Neck,shoulder  . Brain metastases ([place]) 04/06/2017  . Colonic polyp   . Difficulty sleeping   . Encounter for antineoplastic chemotherapy 04/06/2017  . History of radiation therapy 12/04/15-12/14/15   left upper lobe 60 Gy  . Hx of unilateral nephrectomy 1994   right   . Hyperlipemia   . Lung cancer ([place])   . Numbness in feet   . Peripheral neuropathy   . Personal history of PE (pulmonary embolism) 2011    PAST SURGICAL HISTORY: Past Surgical History:  Procedure Laterality Date  . ABDOMINAL HYSTERECTOMY    . APPENDECTOMY    . cataracts Bilateral 09/2014   removal  . CESAREAN SECTION    . NEPHRECTOMY  1993   RT -   . TOTAL HIP ARTHROPLASTY Right 02/25/2013   Procedure: RIGHT TOTAL HIP ARTHROPLASTY ANTERIOR APPROACH;  Surgeon: Mcarthur Rossetti, MD;  Location: [facility];  Service: Orthopedics;  Laterality: Right;    FAMILY HISTORY: Family History  Problem Relation Age of Onset  . Hypertension Father   . Diabetes Father   . Hypertension Mother   . Colon cancer Sister     SOCIAL HISTORY:  Social History   Socioeconomic History  . Marital status: Widowed    Spouse name: Not on file  . Number of children: 2  .  Years of education: masters  . Highest education level: Not on file  Social Needs  . Financial resource strain: Not on file  . Food insecurity - worry: Not on file  . Food insecurity - inability: Not on file  . Transportation needs - medical: Not on file  . Transportation needs - non-medical: Not on file  Occupational History  . Occupation: Retired Pharmacist, hospital    Comment: retired  Tobacco Use  . Smoking status: Former Smoker    Packs/day: 0.25    Years: 20.00    Pack years: 5.00    Types: Cigarettes    Last attempt to quit: 10/20/1989    Years since quitting: 28.0  . Smokeless tobacco: Never Used  Substance and Sexual Activity  . Alcohol use: No    Alcohol/week: 0.0 oz  . Drug use: No  . Sexual activity: Not on file  Other Topics Concern  . Not on file  Social History Narrative   Patient lives at home alone . - Widow.  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   Caffeine- One cup daily.      PHYSICAL EXAM   Vitals:   11/18/17 1403  BP: 128/79  Pulse: 74  Weight: 158 lb (71.7 kg)  Height: 5\' 7"  (1.702 m)    Not recorded      Body mass index is 24.75 kg/m.  PHYSICAL EXAMNIATION:  Gen: NAD, conversant, well nourised, obese, well groomed                     Cardiovascular: Regular rate rhythm, no peripheral edema, warm, nontender. Eyes: Conjunctivae clear without exudates or hemorrhage Neck: Supple, no carotid bruits. Pulmonary: Clear to auscultation bilaterally   NEUROLOGICAL EXAM:  MENTAL STATUS: Speech: Mild comprehension difficulty, mild expressive aphasia, word finding difficulties, paraphasic error. R-L confusion Cognition:     Orientation to time, place and person     Normal recent and remote memory     Normal Attention span and concentration     Normal Language, naming, repeating,spontaneous speech     Fund of knowledge   CRANIAL NERVES: CN II: Visual fields are full to confrontation.   Pupils are round equal and briskly reactive to  light. CN III, IV, VI: extraocular movement are normal. No ptosis. CN V: Facial sensation is intact to pinprick in all 3 divisions bilaterally. Corneal responses are intact.  CN VII: Face is symmetric with normal eye closure and smile. CN VIII: Hearing is normal to rubbing fingers CN IX, X: Palate elevates symmetrically. Phonation is normal. CN XI: Head turning and shoulder shrug are intact CN XII: Tongue is midline with normal movements and no atrophy.  MOTOR: There is no pronator drift of out-stretched arms. Muscle bulk and tone are normal. Muscle strength is normal.  REFLEXES: Reflexes are 2+ and symmetric at the biceps, triceps, knees, and ankles. Plantar responses are flexor.  SENSORY: Intact to light touch, pinprick, positional sensation and vibratory sensation are intact in fingers and toes.  COORDINATION: Rapid alternating movements and fine finger movements are intact. There is no dysmetria on finger-to-nose and heel-knee-shin.    GAIT/STANCE: She need push up to get up, unsteady, mildly antalgic Romberg is absent.   DIAGNOSTIC DATA (LABS, IMAGING, TESTING) - I reviewed patient records, labs, notes, testing and imaging myself where available.   ASSESSMENT AND PLAN  ANEL CREIGHTON is a 84 y.o. female   Brain Metastatic Non-Small Cell Carcinoma Stroke involving left temporal lobe Hx of PE, on Xelrato  With small size left temporal stroke would not explain her acute onset of confusion, expressive more than comprehensive aphasia,  Differentiation diagnosis of her new onset symptoms including partial seizure, radiation induced encephalitis, paraneoplastic syndrome  EEG  Return to clinic in 2 weeks  Will stop aspirin 325 mg for right now, to decrease the potential bleeding complications,   Marcial Pacas, M.D. Ph.D.  [facility] [address], [place], [place] [address] Ph: [phone] Fax: [phone]  CC: Referring Provider

## 2017-11-26 ENCOUNTER — Other Ambulatory Visit: Payer: Self-pay | Admitting: Radiation Therapy

## 2017-11-26 DIAGNOSIS — C7949 Secondary malignant neoplasm of other parts of nervous system: Principal | ICD-10-CM

## 2017-11-26 DIAGNOSIS — C7931 Secondary malignant neoplasm of brain: Secondary | ICD-10-CM

## 2017-11-30 ENCOUNTER — Other Ambulatory Visit: Payer: Medicare Other

## 2017-11-30 ENCOUNTER — Ambulatory Visit: Payer: Medicare Other | Admitting: Neurology

## 2017-11-30 DIAGNOSIS — R410 Disorientation, unspecified: Secondary | ICD-10-CM

## 2017-11-30 DIAGNOSIS — C7931 Secondary malignant neoplasm of brain: Secondary | ICD-10-CM

## 2017-12-02 NOTE — Telephone Encounter (Signed)
ERROR

## 2017-12-04 NOTE — Procedures (Signed)
   HISTORY: 84 year old female, with history of stroke, also brain metastatic lesions.  TECHNIQUE:  16 channel EEG was performed based on standard 10-16 international system. One channel was dedicated to EKG, which has demonstrates normal sinus rhythm of 72 beats per minutes.  Upon awakening, the posterior background activity was well-developed, in alpha range 10 Hz, reactive to eye opening and closure.  There was no evidence of epileptiform discharge.  Photic stimulation was performed, which induced a symmetric photic driving.  Hyperventilation was performed, there was no abnormality elicit.  No sleep was achieved.  CONCLUSION: This is a  normal awake EEG.  There is no electrodiagnostic evidence of epileptiform discharge.  Marcial Pacas, M.D. Ph.D.  [facility] [place], [place] [address] Phone: [phone] Fax:      [phone]

## 2017-12-07 ENCOUNTER — Telehealth: Payer: Self-pay | Admitting: Medical Oncology

## 2017-12-07 MED FILL — GILOTRIF 30 MG TABLET: 30 | 30 days supply | Qty: 30 | Fill #2

## 2017-12-07 NOTE — Telephone Encounter (Signed)
Will stroke affect her having a CT ? I told son, no.

## 2017-12-10 ENCOUNTER — Inpatient Hospital Stay: Payer: Medicare Other | Attending: Oncology

## 2017-12-10 ENCOUNTER — Ambulatory Visit (HOSPITAL_COMMUNITY)
Admission: RE | Admit: 2017-12-10 | Discharge: 2017-12-10 | Disposition: A | Discharge status: Home / Self Care | Payer: Medicare Other | Source: Ambulatory Visit | Attending: Oncology | Admitting: Oncology

## 2017-12-10 DIAGNOSIS — Z905 Acquired absence of kidney: Secondary | ICD-10-CM | POA: Diagnosis not present

## 2017-12-10 DIAGNOSIS — I700 Atherosclerosis of aorta: Secondary | ICD-10-CM | POA: Diagnosis not present

## 2017-12-10 DIAGNOSIS — C787 Secondary malignant neoplasm of liver and intrahepatic bile duct: Secondary | ICD-10-CM | POA: Diagnosis not present

## 2017-12-10 DIAGNOSIS — C3492 Malignant neoplasm of unspecified part of left bronchus or lung: Secondary | ICD-10-CM

## 2017-12-10 DIAGNOSIS — K5730 Diverticulosis of large intestine without perforation or abscess without bleeding: Secondary | ICD-10-CM | POA: Diagnosis not present

## 2017-12-10 DIAGNOSIS — C3412 Malignant neoplasm of upper lobe, left bronchus or lung: Secondary | ICD-10-CM | POA: Diagnosis not present

## 2017-12-10 DIAGNOSIS — I2510 Atherosclerotic heart disease of native coronary artery without angina pectoris: Secondary | ICD-10-CM | POA: Insufficient documentation

## 2017-12-10 DIAGNOSIS — C7931 Secondary malignant neoplasm of brain: Secondary | ICD-10-CM | POA: Diagnosis not present

## 2017-12-10 DIAGNOSIS — R918 Other nonspecific abnormal finding of lung field: Secondary | ICD-10-CM | POA: Diagnosis not present

## 2017-12-10 LAB — CBC WITH DIFFERENTIAL (CANCER CENTER ONLY)
BASOS ABS: 0 10*3/uL (ref 0.0–0.1)
Basophils Relative: 0 %
EOS PCT: 5 %
Eosinophils Absolute: 0.3 10*3/uL (ref 0.0–0.5)
HEMATOCRIT: 39.5 % (ref 34.8–46.6)
Hemoglobin: 13.3 g/dL (ref 11.6–15.9)
LYMPHS ABS: 2 10*3/uL (ref 0.9–3.3)
LYMPHS PCT: 32 %
MCH: 30.9 pg (ref 25.1–34.0)
MCHC: 33.7 g/dL (ref 31.5–36.0)
MCV: 91.9 fL (ref 79.5–101.0)
MONO ABS: 0.6 10*3/uL (ref 0.1–0.9)
Monocytes Relative: 10 %
NEUTROS ABS: 3.3 10*3/uL (ref 1.5–6.5)
Neutrophils Relative %: 53 %
Platelet Count: 177 10*3/uL (ref 145–400)
RBC: 4.3 MIL/uL (ref 3.70–5.45)
RDW: 14.7 % — AB (ref 11.2–14.5)
WBC Count: 6.3 10*3/uL (ref 3.9–10.3)

## 2017-12-10 LAB — CMP (CANCER CENTER ONLY)
ALBUMIN: 3.7 g/dL (ref 3.5–5.0)
ALT: 44 U/L (ref 0–55)
ANION GAP: 9 (ref 3–11)
AST: 48 U/L — AB (ref 5–34)
Alkaline Phosphatase: 422 U/L — ABNORMAL HIGH (ref 40–150)
BILIRUBIN TOTAL: 0.9 mg/dL (ref 0.2–1.2)
BUN: 16 mg/dL (ref 7–26)
CHLORIDE: 102 mmol/L (ref 98–109)
CO2: 27 mmol/L (ref 22–29)
Calcium: 9.8 mg/dL (ref 8.4–10.4)
Creatinine: 1.19 mg/dL — ABNORMAL HIGH (ref 0.60–1.10)
GFR, Est AFR Am: 46 mL/min — ABNORMAL LOW (ref >60)
GFR, Estimated: 40 mL/min — ABNORMAL LOW (ref >60)
GLUCOSE: 99 mg/dL (ref 70–140)
POTASSIUM: 4.7 mmol/L (ref 3.5–5.1)
SODIUM: 138 mmol/L (ref 136–145)
TOTAL PROTEIN: 7.9 g/dL (ref 6.4–8.3)

## 2017-12-10 MED ORDER — IOPAMIDOL (ISOVUE-300) INJECTION 61%
INTRAVENOUS | Status: AC
  Filled 2017-12-10: qty 100

## 2017-12-10 MED ORDER — IOPAMIDOL (ISOVUE-300) INJECTION 61%
100.0000 mL | Freq: Once | INTRAVENOUS | Status: AC | PRN
  Administered 2017-12-10: 80 mL via INTRAVENOUS

## 2017-12-15 ENCOUNTER — Inpatient Hospital Stay (HOSPITAL_BASED_OUTPATIENT_CLINIC_OR_DEPARTMENT_OTHER): Payer: Medicare Other | Admitting: Internal Medicine

## 2017-12-15 ENCOUNTER — Telehealth: Payer: Self-pay | Admitting: Oncology

## 2017-12-15 ENCOUNTER — Encounter: Payer: Self-pay | Admitting: Internal Medicine

## 2017-12-15 VITALS — BP 116/68 | HR 83 | Temp 97.9°F | Resp 16 | Ht 67.0 in | Wt 157.1 lb

## 2017-12-15 DIAGNOSIS — C3412 Malignant neoplasm of upper lobe, left bronchus or lung: Secondary | ICD-10-CM | POA: Diagnosis not present

## 2017-12-15 DIAGNOSIS — C7931 Secondary malignant neoplasm of brain: Secondary | ICD-10-CM

## 2017-12-15 DIAGNOSIS — C787 Secondary malignant neoplasm of liver and intrahepatic bile duct: Secondary | ICD-10-CM | POA: Diagnosis not present

## 2017-12-15 DIAGNOSIS — C3492 Malignant neoplasm of unspecified part of left bronchus or lung: Secondary | ICD-10-CM

## 2017-12-15 NOTE — Progress Notes (Signed)
[place] [phone]   [phone]  OFFICE PROGRESS NOTE  Derinda Late, MD [place] [address]  DIAGNOSIS: Stage IV (T1a, N2, M1b) non-small cell lung cancer, adenocarcinoma with positive EGFR mutation with deletion in exon 19 diagnosed initially in January 2017, with metastatic disease in May 2018 based on liver biopsy.  Repeat Foundation One test on 09/17/17 Biomarker Findings Microsatellite Status - MS-Stable Tumor Mutational Burden - TMB-Low (5 Muts/Mb) Genomic Findings For a complete list of the genes assayed, please refer to the Appendix. EGFR S768I, V769L CDKN2A/B loss MTAP loss exons 6-8 NFKBIA amplification NKX2-1 amplification TP53 R337L 7 Disease relevant genes with no reportable alterations: KRAS, ALK, BRAF, MET, RET, ERBB2, ROS1  PRIOR THERAPY:  1) Stereotactic body radiotherapy to the left upper lobe lung nodule under the care of Dr. Pablo Ledger without biopsy completed 12/14/2015. 2) stereotactic radiotherapy to a solitary brain metastasis under the care of Dr. Lisbeth Renshaw on 04/21/2017. 3) Tagrisso 80 mg by mouth daily, first dose started 04/09/2017. 4) Tagrisso 80 mg by mouth daily and Avastin 15 MG/KG every 3 weeks was added starting 07/09/2017 secondary to disease progression on single agent Tagrisso.  Status post 3 cycles with Avastin.  Last dose was given August 20, 2017 this was discontinued secondary to disease progression.   CURRENT THERAPY: GILOTRIF 30 mg p.o. 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  She is expected to start the first dose of this treatment in the next few days.  INTERVAL HISTORY: [name] 84 y.o. female returns to the clinic today for follow-up visit accompanied by her son.  The patient is feeling complaints.  She was diagnosed last month with a stroke and admitted to [facility].  She is feeling much better today.  She denied having any fever or chills.  She  has no nausea, vomiting, diarrhea or constipation.  She denied having any skin rash.  She is tolerating her current treatment with GILOTRIF fairly well.  She has no chest pain, shortness of breath, cough or hemoptysis.  The patient had repeat CT scan of the chest, abdomen and pelvis performed recently and she is here for evaluation and discussion of her scan results.   MEDICAL HISTORY: Past Medical History:  Diagnosis Date  . Adenocarcinoma of left lung, stage 4 ([place]) 03/18/2017  . Arthritis    Neck,shoulder  . Brain metastases ([place]) 04/06/2017  . Colonic polyp   . Difficulty sleeping   . Encounter for antineoplastic chemotherapy 04/06/2017  . History of radiation therapy 12/04/15-12/14/15   left upper lobe 60 Gy  . Hx of unilateral nephrectomy 1994   right   . Hyperlipemia   . Lung cancer ([place])   . Numbness in feet   . Peripheral neuropathy   . Personal history of PE (pulmonary embolism) 2011    ALLERGIES:  is allergic to penicillins.  MEDICATIONS:  Current Outpatient Medications  Medication Sig Dispense Refill  . afatinib dimaleate (GILOTRIF) 30 MG tablet Take 1 tablet (30 mg total) by mouth daily. Take on an empty stomach 1hr before or 2hrs after meals. 30 tablet 2  . aspirin 325 MG tablet Take 1 tablet (325 mg total) by mouth daily. 30 tablet 0  . atorvastatin (LIPITOR) 10 MG tablet Take 1 tablet (10 mg total) by mouth daily at 6 PM. 30 tablet 0  . CALCIUM CARBONATE PO Take 1 tablet by mouth daily.    . celecoxib (CELEBREX) 200  MG capsule take 1 capsule by mouth once daily if needed for NECK PAIN OR ARTHRITIS  0  . gabapentin (NEURONTIN) 100 MG capsule Take 300 mg by mouth at bedtime. 3 tablets at bedtime    . metoprolol succinate (TOPROL-XL) 25 MG 24 hr tablet Take 0.5 tablets (12.5 mg total) by mouth daily. 45 tablet 3  . Multiple Vitamin (MULTIVITAMIN WITH MINERALS) TABS Take 1 tablet by mouth daily.    . rivaroxaban (XARELTO) 20 MG TABS tablet Take 20 mg by mouth daily with  supper.     No current facility-administered medications for this visit.     SURGICAL HISTORY:  Past Surgical History:  Procedure Laterality Date  . ABDOMINAL HYSTERECTOMY    . APPENDECTOMY    . cataracts Bilateral 09/2014   removal  . CESAREAN SECTION    . NEPHRECTOMY  1993   RT -   . TOTAL HIP ARTHROPLASTY Right 02/25/2013   Procedure: RIGHT TOTAL HIP ARTHROPLASTY ANTERIOR APPROACH;  Surgeon: Mcarthur Rossetti, MD;  Location: [facility];  Service: Orthopedics;  Laterality: Right;    REVIEW OF SYSTEMS:  Constitutional: positive for fatigue Eyes: negative Ears, nose, mouth, throat, and face: negative Respiratory: negative Cardiovascular: negative Gastrointestinal: negative Genitourinary:negative Integument/breast: negative Hematologic/lymphatic: negative Musculoskeletal:positive for muscle weakness Neurological: negative Behavioral/Psych: negative Endocrine: negative Allergic/Immunologic: negative   PHYSICAL EXAMINATION: General appearance: alert, cooperative, fatigued and no distress Head: Normocephalic, without obvious abnormality, atraumatic Neck: no adenopathy, no JVD, supple, symmetrical, trachea midline and thyroid not enlarged, symmetric, no tenderness/mass/nodules Lymph nodes: Cervical, supraclavicular, and axillary nodes normal. Resp: clear to auscultation bilaterally Back: symmetric, no curvature. ROM normal. No CVA tenderness. Cardio: regular rate and rhythm, S1, S2 normal, no murmur, click, rub or gallop GI: soft, non-tender; bowel sounds normal; no masses,  no organomegaly Extremities: extremities normal, atraumatic, no cyanosis or edema Neurologic: Alert and oriented X 3, normal strength and tone. Normal symmetric reflexes. Normal coordination and gait  ECOG PERFORMANCE STATUS: 1 - Symptomatic but completely ambulatory  Blood pressure 116/68, pulse 83, temperature 97.9 F (36.6 C), temperature source Oral, resp. rate 16, height '5\' 7"'  (1.702 m), weight 157  lb 1.6 oz (71.3 kg), SpO2 100 %.  LABORATORY DATA: Lab Results  Component Value Date   WBC 6.3 12/10/2017   HGB 13.9 11/11/2017   HCT 39.5 12/10/2017   MCV 91.9 12/10/2017   PLT 177 12/10/2017      Chemistry      Component Value Date/Time   NA 138 12/10/2017 1056   NA 138 10/08/2017 0916   K 4.7 12/10/2017 1056   K 4.3 10/08/2017 0916   CL 102 12/10/2017 1056   CO2 27 12/10/2017 1056   CO2 24 10/08/2017 0916   BUN 16 12/10/2017 1056   BUN 16.7 10/08/2017 0916   CREATININE 1.19 (H) 12/10/2017 1056   CREATININE 1.1 10/08/2017 0916      Component Value Date/Time   CALCIUM 9.8 12/10/2017 1056   CALCIUM 9.0 10/08/2017 0916   ALKPHOS 422 (H) 12/10/2017 1056   ALKPHOS 339 (H) 10/08/2017 0916   AST 48 (H) 12/10/2017 1056   AST 55 (H) 10/08/2017 0916   ALT 44 12/10/2017 1056   ALT 39 10/08/2017 0916   BILITOT 0.9 12/10/2017 1056   BILITOT 0.99 10/08/2017 0916       RADIOGRAPHIC STUDIES: Ct Chest W Contrast  Result Date: 12/10/2017 CLINICAL DATA:  Stage IV left lung adenocarcinoma diagnosed January 2017 with liver metastases. Patient presents for restaging. EXAM: CT  CHEST, ABDOMEN, AND PELVIS WITH CONTRAST TECHNIQUE: Multidetector CT imaging of the chest, abdomen and pelvis was performed following the standard protocol during bolus administration of intravenous contrast. CONTRAST:  96m ISOVUE-300 IOPAMIDOL (ISOVUE-300) INJECTION 61% COMPARISON:  09/07/2017 CT chest, abdomen and pelvis. FINDINGS: CT CHEST FINDINGS Cardiovascular: Normal heart size. No significant pericardial fluid/thickening. Left anterior descending and left circumflex coronary atherosclerosis. Atherosclerotic nonaneurysmal thoracic aorta. Normal caliber pulmonary arteries. No central pulmonary emboli. Mediastinum/Nodes: Stable small hypodense bilateral thyroid nodules, largest 1.1 cm on the left. Unremarkable esophagus. No pathologically enlarged axillary, mediastinal or hilar lymph nodes. Previously mildly  enlarged 1.0 cm AP window node is decreased to 0.6 cm (series 2/image 19). Lungs/Pleura: No pneumothorax. No pleural effusion. Anterior apical left upper lobe solid 1.7 x 1.4 cm pulmonary nodule (series 6/image 16), previously 1.8 x 1.6 cm, stable to mildly decreased. Sharply marginated bandlike consolidation and ground-glass attenuation surrounding the apical left upper lobe pulmonary nodule is unchanged and compatible with postradiation change. Small scattered ground-glass pulmonary nodules in the right lung, largest 9 mm in the right upper lobe (series 6/image 49), stable. No acute consolidative airspace disease or new significant pulmonary nodules. Diffuse bronchial wall thickening. Musculoskeletal: No aggressive appearing focal osseous lesions. Mild-to-moderate thoracic spondylosis. CT ABDOMEN PELVIS FINDINGS Hepatobiliary: Multiple confluent poorly marginated liver masses throughout the liver are stable to mildly increased in size, with representative lesions as follows: -posterior right liver lobe 5.9 x 5.7 cm mass (series 2/image 46), previously 5.9 x 5.9 cm using similar measurement technique, not appreciably changed -segment 8 right liver lobe 2.5 x 1.8 cm mass (series 2/image 42), previously 2.5 x 1.9 cm, stable -segment 4A left liver lobe 1.7 x 1.6 cm mass (series 2/image 45), previously 1.5 x 1.4 cm, minimally increased -anterior segment 2 left liver lobe 1.1 cm mass (series 2/image 46), previously 0.7 cm, mildly increased Normal gallbladder with no radiopaque cholelithiasis. No biliary ductal dilatation. Pancreas: Normal, with no mass or duct dilation. Spleen: Normal size. No mass. Adrenals/Urinary Tract: No discrete adrenal nodules. Stable postsurgical changes from right nephrectomy with no right nephrectomy bed mass. Normal left kidney, with no left renal mass and no left hydronephrosis. Nondistended bladder obscured by streak artifact from right hip hardware with no gross bladder abnormality.  Stomach/Bowel: Normal non-distended stomach. Normal caliber small bowel with no small bowel wall thickening. Appendectomy. Moderate sigmoid diverticulosis, with no large bowel wall thickening or pericolonic fat stranding. Oral contrast reaches the sigmoid colon. Vascular/Lymphatic: Atherosclerotic nonaneurysmal abdominal aorta. Patent portal, splenic, hepatic and left renal veins. No pathologically enlarged lymph nodes in the abdomen or pelvis. Reproductive: Status post hysterectomy, with no abnormal findings at the vaginal cuff. No adnexal mass. Other: No pneumoperitoneum, ascites or focal fluid collection. Musculoskeletal: No aggressive appearing focal osseous lesions. Moderate lumbar spondylosis. Stable L4 vertebral hemangioma. Partially visualized right total hip arthroplasty. IMPRESSION: 1. Liver metastases are stable to mildly increased in size. 2. Apical left upper lobe pulmonary nodule is stable to mildly decreased in size. 3. No new sites of metastatic disease. 4. Small ground-glass pulmonary nodules in the right lung are stable. 5. Chronic findings include: Aortic Atherosclerosis (ICD10-I70.0). Coronary atherosclerosis. Right nephrectomy. Moderate sigmoid diverticulosis. Electronically Signed   By: JIlona SorrelM.D.   On: 12/10/2017 14:24   Ct Abdomen Pelvis W Contrast  Result Date: 12/10/2017 CLINICAL DATA:  Stage IV left lung adenocarcinoma diagnosed January 2017 with liver metastases. Patient presents for restaging. EXAM: CT CHEST, ABDOMEN, AND PELVIS WITH CONTRAST TECHNIQUE: Multidetector CT  imaging of the chest, abdomen and pelvis was performed following the standard protocol during bolus administration of intravenous contrast. CONTRAST:  71m ISOVUE-300 IOPAMIDOL (ISOVUE-300) INJECTION 61% COMPARISON:  09/07/2017 CT chest, abdomen and pelvis. FINDINGS: CT CHEST FINDINGS Cardiovascular: Normal heart size. No significant pericardial fluid/thickening. Left anterior descending and left circumflex  coronary atherosclerosis. Atherosclerotic nonaneurysmal thoracic aorta. Normal caliber pulmonary arteries. No central pulmonary emboli. Mediastinum/Nodes: Stable small hypodense bilateral thyroid nodules, largest 1.1 cm on the left. Unremarkable esophagus. No pathologically enlarged axillary, mediastinal or hilar lymph nodes. Previously mildly enlarged 1.0 cm AP window node is decreased to 0.6 cm (series 2/image 19). Lungs/Pleura: No pneumothorax. No pleural effusion. Anterior apical left upper lobe solid 1.7 x 1.4 cm pulmonary nodule (series 6/image 16), previously 1.8 x 1.6 cm, stable to mildly decreased. Sharply marginated bandlike consolidation and ground-glass attenuation surrounding the apical left upper lobe pulmonary nodule is unchanged and compatible with postradiation change. Small scattered ground-glass pulmonary nodules in the right lung, largest 9 mm in the right upper lobe (series 6/image 49), stable. No acute consolidative airspace disease or new significant pulmonary nodules. Diffuse bronchial wall thickening. Musculoskeletal: No aggressive appearing focal osseous lesions. Mild-to-moderate thoracic spondylosis. CT ABDOMEN PELVIS FINDINGS Hepatobiliary: Multiple confluent poorly marginated liver masses throughout the liver are stable to mildly increased in size, with representative lesions as follows: -posterior right liver lobe 5.9 x 5.7 cm mass (series 2/image 46), previously 5.9 x 5.9 cm using similar measurement technique, not appreciably changed -segment 8 right liver lobe 2.5 x 1.8 cm mass (series 2/image 42), previously 2.5 x 1.9 cm, stable -segment 4A left liver lobe 1.7 x 1.6 cm mass (series 2/image 45), previously 1.5 x 1.4 cm, minimally increased -anterior segment 2 left liver lobe 1.1 cm mass (series 2/image 46), previously 0.7 cm, mildly increased Normal gallbladder with no radiopaque cholelithiasis. No biliary ductal dilatation. Pancreas: Normal, with no mass or duct dilation. Spleen:  Normal size. No mass. Adrenals/Urinary Tract: No discrete adrenal nodules. Stable postsurgical changes from right nephrectomy with no right nephrectomy bed mass. Normal left kidney, with no left renal mass and no left hydronephrosis. Nondistended bladder obscured by streak artifact from right hip hardware with no gross bladder abnormality. Stomach/Bowel: Normal non-distended stomach. Normal caliber small bowel with no small bowel wall thickening. Appendectomy. Moderate sigmoid diverticulosis, with no large bowel wall thickening or pericolonic fat stranding. Oral contrast reaches the sigmoid colon. Vascular/Lymphatic: Atherosclerotic nonaneurysmal abdominal aorta. Patent portal, splenic, hepatic and left renal veins. No pathologically enlarged lymph nodes in the abdomen or pelvis. Reproductive: Status post hysterectomy, with no abnormal findings at the vaginal cuff. No adnexal mass. Other: No pneumoperitoneum, ascites or focal fluid collection. Musculoskeletal: No aggressive appearing focal osseous lesions. Moderate lumbar spondylosis. Stable L4 vertebral hemangioma. Partially visualized right total hip arthroplasty. IMPRESSION: 1. Liver metastases are stable to mildly increased in size. 2. Apical left upper lobe pulmonary nodule is stable to mildly decreased in size. 3. No new sites of metastatic disease. 4. Small ground-glass pulmonary nodules in the right lung are stable. 5. Chronic findings include: Aortic Atherosclerosis (ICD10-I70.0). Coronary atherosclerosis. Right nephrectomy. Moderate sigmoid diverticulosis. Electronically Signed   By: JIlona SorrelM.D.   On: 12/10/2017 14:24    ASSESSMENT AND PLAN:  This is a very pleasant 84years old African-American female with metastatic non-small cell lung cancer that was initially diagnosed as a stage IA status post stereotactic radiotherapy of the left upper lobe and now she presented with disease recurrence as well  as mediastinal lymphadenopathy and liver  metastasis and solitary brain metastasis. 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 She underwent stereotactic radiotherapy to a solitary brain metastasis. She is currently on treatment with Tagrisso 80 mg by mouth daily status post 4 months. He was also started recently on Avastin 15 MG/KG every 3 weeks is status post 3 cycles.  This treatment was discontinued secondary to disease progression. 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  The patient is currently undergoing treatment with GILOTRIF 30 mg p.o. daily status post 2 months of treatment and has been tolerating it fairly well. She had a repeat CT scan of the chest, abdomen and pelvis performed recently.  I personally and independently reviewed the scans and discussed the results with the patient and her son.  Her scan showed no significant disease progression but there was slight increase in the size of liver lesion. I recommended for the patient to continue her current treatment with GILOTRIF with the same dose. I will see her back for follow-up visit in 1 month for reevaluation with repeat blood work. The patient was advised to call immediately if she has any concerning symptoms in the interval. The patient voices understanding of current disease status and treatment options and is in agreement with the current care plan. All questions were answered. The patient knows to call the clinic with any problems, questions or concerns. We can certainly see the patient much sooner if necessary.  Disclaimer: This note was dictated with voice recognition software. Similar sounding words can inadvertently be transcribed and may not be corrected upon review.

## 2017-12-15 NOTE — Telephone Encounter (Signed)
Appointments scheduled AVS/Calendar printed per 2/26 los

## 2017-12-15 NOTE — Addendum Note (Signed)
Addended by: Ardeen Garland on: 12/15/2017 10:10 AM   Modules accepted: Orders

## 2017-12-25 ENCOUNTER — Encounter: Payer: Self-pay | Admitting: Neurology

## 2017-12-25 ENCOUNTER — Ambulatory Visit: Payer: Medicare Other | Admitting: Neurology

## 2017-12-25 VITALS — BP 116/68 | HR 85 | Ht 66.0 in | Wt 159.0 lb

## 2017-12-25 DIAGNOSIS — I639 Cerebral infarction, unspecified: Secondary | ICD-10-CM | POA: Diagnosis not present

## 2017-12-25 DIAGNOSIS — C7931 Secondary malignant neoplasm of brain: Secondary | ICD-10-CM | POA: Diagnosis not present

## 2017-12-25 NOTE — Progress Notes (Signed)
PATIENT: [name] DOB: [birth year]  Chief Complaint  Patient presents with  . Follow-up    pt with son, rm 4, pt syayes that everything is ok.     HISTORICAL  [name] is a 84 year old female, accompanied by her son [name], is here to follow-up with her most recent hospital discharge, initial evaluation was on November 18, 2017.  She was admitted to the hospital from January 23, to 25 2019 for acute onset of language difficulty, confusion, which started early morning, continue progressed over the next few hours. MRI of brain on November 11, 2017 showed 3 small acute left temporal occipital infarction, measuring up to 4 mm, right cerebellar folia enhancement, concerning for new metastatic lesion versus subacute infarction.  Tiny residual treated left frontal lobe metastatic lesion. Echocardiogram was unremarkable Ultrasound of carotid arteries showed less than 39% stenosis bilaterally LDL was 83, he was started on Lipitor 10 mg daily, She was already on chronic anticoagulation Xarelto with history of PE, aspirin 325 mg was added.  She complains of coughing up pink phlegm, there was a concern of increased bleeding with Xarelto and aspirin 325 mg.  I reviewed  her extensive oncology evaluation, she was diagnosed with left lung upper lobe non-small cell carcinoma, received radiation therapy December 04, 2015 to December 14, 2015. She developed left superior frontal gyri metastatic lesion, received 20 Gy radiation treatment in 1 fraction on April 21, 2017.  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  She complains of bilateral feet paresthesia since 2010, EMG nerve conduction study was normal in April 2014, nerve biopsy in January 2015 showed no significant pathology.  Extensive laboratory evaluations showed anemia hemoglobin of 10.5, otherwise no treatable etiology, including normal negative B12, CMP, RPR, protein electrophoresis.  She also had a history of right hip replacement in May 2014, she complains of bilateral feet paresthesia, restless leg symptoms, last visit was in 2016, there was a concern of iron deficiency, she was given prescription of gabapentin 100mg  3 times a day  UPDATE December 25 2017: EEG was normal.  Patient no longer has confusion episode, has been off aspirin, overall has been stable.  Was seen by her oncologist Dr. Julien Nordmann on December 15, 2016, is taking Glotrif daily    REVIEW OF SYSTEMS: Full 14 system review of systems performed and notable only for as above  ALLERGIES: Allergies  Allergen Reactions  . Penicillins Rash    Has patient had a PCN reaction causing immediate rash, facial/tongue/throat swelling, SOB or lightheadedness with hypotension: Unknown Has patient had a PCN reaction causing severe rash involving mucus membranes or skin necrosis: Unknown Has patient had a PCN reaction that required hospitalization: Unknown Has patient had a PCN reaction occurring within the last 10 years: Unknown If all of the above answers are "NO", then may proceed with Cephalosporin use.     HOME MEDICATIONS: Current Outpatient Medications  Medication Sig Dispense Refill  . afatinib dimaleate (GILOTRIF) 30 MG tablet Take 1 tablet (30 mg total) by mouth daily. Take on an empty stomach 1hr before or 2hrs after  meals. 30 tablet 2  . gabapentin (NEURONTIN) 100 MG capsule Take 300 mg by mouth at bedtime. 3 tablets at bedtime    . metoprolol succinate (TOPROL-XL) 25 MG 24 hr tablet Take 0.5  tablets (12.5 mg total) by mouth daily. 45 tablet 3  . Multiple Vitamin (MULTIVITAMIN WITH MINERALS) TABS Take 1 tablet by mouth daily.    . rivaroxaban (XARELTO) 20 MG TABS tablet Take 20 mg by mouth daily with supper.     No current facility-administered medications for this visit.     PAST MEDICAL HISTORY: Past Medical History:  Diagnosis Date  . Adenocarcinoma of left lung, stage 4 ([place]) 03/18/2017  . Arthritis    Neck,shoulder  . Brain metastases ([place]) 04/06/2017  . Colonic polyp   . Difficulty sleeping   . Encounter for antineoplastic chemotherapy 04/06/2017  . History of radiation therapy 12/04/15-12/14/15   left upper lobe 60 Gy  . Hx of unilateral nephrectomy 1994   right   . Hyperlipemia   . Lung cancer ([place])   . Numbness in feet   . Peripheral neuropathy   . Personal history of PE (pulmonary embolism) 2011    PAST SURGICAL HISTORY: Past Surgical History:  Procedure Laterality Date  . ABDOMINAL HYSTERECTOMY    . APPENDECTOMY    . cataracts Bilateral 09/2014   removal  . CESAREAN SECTION    . NEPHRECTOMY  1993   RT -   . TOTAL HIP ARTHROPLASTY Right 02/25/2013   Procedure: RIGHT TOTAL HIP ARTHROPLASTY ANTERIOR APPROACH;  Surgeon: Mcarthur Rossetti, MD;  Location: [facility];  Service: Orthopedics;  Laterality: Right;    FAMILY HISTORY: Family History  Problem Relation Age of Onset  . Hypertension Father   . Diabetes Father   . Hypertension Mother   . Colon cancer Sister     SOCIAL HISTORY:  Social History   Socioeconomic History  . Marital status: Widowed    Spouse name: Not on file  . Number of children: 2  . Years of education: masters  . Highest education level: Not on file  Social Needs  . Financial resource strain: Not on file  . Food insecurity - worry: Not on file  . Food insecurity - inability: Not on file  . Transportation needs - medical: Not on file  . Transportation needs - non-medical: Not on file  Occupational History  . Occupation:  Retired Pharmacist, hospital    Comment: retired  Tobacco Use  . Smoking status: Former Smoker    Packs/day: 0.25    Years: 20.00    Pack years: 5.00    Types: Cigarettes    Last attempt to quit: 10/20/1989    Years since quitting: 28.2  . Smokeless tobacco: Never Used  Substance and Sexual Activity  . Alcohol use: No    Alcohol/week: 0.0 oz  . Drug use: No  . Sexual activity: Not on file  Other Topics Concern  . Not on file  Social History Narrative   Patient lives at home alone . - Widow.   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   Caffeine- One cup daily.      PHYSICAL EXAM   Vitals:   12/25/17 0815  BP: 116/68  Pulse: 85  Weight: 159 lb (72.1 kg)  Height: 5\' 6"  (1.676 m)    Not recorded      Body mass index is 25.66 kg/m.  PHYSICAL EXAMNIATION:  Gen: NAD, conversant, well nourised, obese, well groomed  Cardiovascular: Regular rate rhythm, no peripheral edema, warm, nontender. Eyes: Conjunctivae clear without exudates or hemorrhage Neck: Supple, no carotid bruits. Pulmonary: Clear to auscultation bilaterally   NEUROLOGICAL EXAM:  MENTAL STATUS: Speech: Awake and alert, there was no aphasia noticed, Cognition:     Orientation to time, place and person     Normal recent and remote memory     Normal Attention span and concentration     Normal Language, naming, repeating,spontaneous speech     Fund of knowledge   CRANIAL NERVES: CN II: Visual fields are full to confrontation.   Pupils are round equal and briskly reactive to light. CN III, IV, VI: extraocular movement are normal. No ptosis. CN V: Facial sensation is intact to pinprick in all 3 divisions bilaterally. Corneal responses are intact.  CN VII: Face is symmetric with normal eye closure and smile. CN VIII: Hearing is normal to rubbing fingers CN IX, X: Palate elevates symmetrically. Phonation is normal. CN XI: Head turning and shoulder shrug are intact CN XII: Tongue is midline  with normal movements and no atrophy.  MOTOR: There is no pronator drift of out-stretched arms. Muscle bulk and tone are normal. Muscle strength is normal.  REFLEXES: Reflexes are 2+ and symmetric at the biceps, triceps, knees, and ankles. Plantar responses are flexor.  SENSORY: Intact to light touch, pinprick, positional sensation and vibratory sensation are intact in fingers and toes.  COORDINATION: Rapid alternating movements and fine finger movements are intact. There is no dysmetria on finger-to-nose and heel-knee-shin.    GAIT/STANCE: She need push up to get up, unsteady, mildly antalgic Romberg is absent.   DIAGNOSTIC DATA (LABS, IMAGING, TESTING) - I reviewed patient records, labs, notes, testing and imaging myself where available.   ASSESSMENT AND PLAN  [name] is a 84 y.o. 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 Ph.D.  [facility] [address] [place], [place] [address] Ph: [phone] Fax: [phone]  CC: Referring Provider

## 2017-12-28 ENCOUNTER — Telehealth: Payer: Self-pay | Admitting: *Deleted

## 2017-12-28 MED ORDER — METHYLPREDNISOLONE 4 MG PO TBPK: ORAL_TABLET | ORAL | 0 refills | Status: DC

## 2017-12-28 NOTE — Telephone Encounter (Signed)
Pt called earlier with concerns about hip pain and rash to stomach and chest. Pt currently on Gilotrif. Reviewed with MD, notified pt to take Tylenol for hip pain and Medrol dose pak sent to pt's pharmacy.

## 2017-12-31 ENCOUNTER — Telehealth: Payer: Self-pay | Admitting: Medical Oncology

## 2017-12-31 ENCOUNTER — Other Ambulatory Visit: Payer: Self-pay | Admitting: Pharmacist

## 2017-12-31 NOTE — Telephone Encounter (Signed)
Side effects from medrol dose pak-Voice lowered and hearing loss- after taking 2 days of prednisone. She said she could hear but could not hear self speak. She stopped medrol dose pak , skin itching is better. I suggested OTC hydrocortisone or benadryk cream.

## 2018-01-11 ENCOUNTER — Other Ambulatory Visit: Payer: Self-pay | Admitting: Internal Medicine

## 2018-01-11 DIAGNOSIS — C3492 Malignant neoplasm of unspecified part of left bronchus or lung: Secondary | ICD-10-CM

## 2018-01-11 MED FILL — GILOTRIF 30 MG TABLET: 30 | 30 days supply | Qty: 30 | Fill #0

## 2018-01-14 ENCOUNTER — Inpatient Hospital Stay: Payer: Medicare Other | Attending: Oncology

## 2018-01-14 ENCOUNTER — Telehealth: Payer: Self-pay | Admitting: Internal Medicine

## 2018-01-14 ENCOUNTER — Inpatient Hospital Stay (HOSPITAL_BASED_OUTPATIENT_CLINIC_OR_DEPARTMENT_OTHER): Payer: Medicare Other | Admitting: Internal Medicine

## 2018-01-14 ENCOUNTER — Encounter: Payer: Self-pay | Admitting: Internal Medicine

## 2018-01-14 VITALS — BP 136/66 | HR 101 | Temp 97.9°F | Resp 18 | Ht 66.0 in | Wt 155.9 lb

## 2018-01-14 DIAGNOSIS — C7931 Secondary malignant neoplasm of brain: Secondary | ICD-10-CM | POA: Insufficient documentation

## 2018-01-14 DIAGNOSIS — R21 Rash and other nonspecific skin eruption: Secondary | ICD-10-CM | POA: Insufficient documentation

## 2018-01-14 DIAGNOSIS — C3412 Malignant neoplasm of upper lobe, left bronchus or lung: Secondary | ICD-10-CM | POA: Diagnosis not present

## 2018-01-14 DIAGNOSIS — C3492 Malignant neoplasm of unspecified part of left bronchus or lung: Secondary | ICD-10-CM

## 2018-01-14 DIAGNOSIS — C787 Secondary malignant neoplasm of liver and intrahepatic bile duct: Secondary | ICD-10-CM | POA: Diagnosis not present

## 2018-01-14 LAB — CBC WITH DIFFERENTIAL (CANCER CENTER ONLY)
Basophils Absolute: 0.1 10*3/uL (ref 0.0–0.1)
Basophils Relative: 1 %
Eosinophils Absolute: 0.4 10*3/uL (ref 0.0–0.5)
Eosinophils Relative: 6 %
HCT: 38.2 % (ref 34.8–46.6)
Hemoglobin: 12.6 g/dL (ref 11.6–15.9)
Lymphocytes Relative: 28 %
Lymphs Abs: 2 10*3/uL (ref 0.9–3.3)
MCH: 30.6 pg (ref 25.1–34.0)
MCHC: 33.1 g/dL (ref 31.5–36.0)
MCV: 92.5 fL (ref 79.5–101.0)
Monocytes Absolute: 0.6 10*3/uL (ref 0.1–0.9)
Monocytes Relative: 9 %
Neutro Abs: 4 10*3/uL (ref 1.5–6.5)
Neutrophils Relative %: 56 %
Platelet Count: 204 10*3/uL (ref 145–400)
RBC: 4.13 MIL/uL (ref 3.70–5.45)
RDW: 14.6 % — ABNORMAL HIGH (ref 11.2–14.5)
WBC Count: 7.2 10*3/uL (ref 3.9–10.3)

## 2018-01-14 LAB — CMP (CANCER CENTER ONLY)
ALBUMIN: 3.3 g/dL — AB (ref 3.5–5.0)
ALT: 37 U/L (ref 0–55)
AST: 41 U/L — AB (ref 5–34)
Alkaline Phosphatase: 393 U/L — ABNORMAL HIGH (ref 40–150)
Anion gap: 8 (ref 3–11)
BILIRUBIN TOTAL: 0.7 mg/dL (ref 0.2–1.2)
BUN: 22 mg/dL (ref 7–26)
CALCIUM: 9.6 mg/dL (ref 8.4–10.4)
CO2: 25 mmol/L (ref 22–29)
CREATININE: 1.26 mg/dL — AB (ref 0.60–1.10)
Chloride: 106 mmol/L (ref 98–109)
GFR, Est AFR Am: 43 mL/min — ABNORMAL LOW (ref >60)
GFR, Estimated: 37 mL/min — ABNORMAL LOW (ref >60)
GLUCOSE: 111 mg/dL (ref 70–140)
Potassium: 4.5 mmol/L (ref 3.5–5.1)
Sodium: 139 mmol/L (ref 136–145)
TOTAL PROTEIN: 7.3 g/dL (ref 6.4–8.3)

## 2018-01-14 NOTE — Progress Notes (Signed)
[facility] [phone]   [phone]  OFFICE PROGRESS NOTE  Derinda Late, MD [place] [address]  DIAGNOSIS: Stage IV (T1a, N2, M1b) non-small cell lung cancer, adenocarcinoma with positive EGFR mutation with deletion in exon 19 diagnosed initially in January 2017, with metastatic disease in May 2018 based on liver biopsy.  Repeat Foundation One test on 09/17/17 Biomarker Findings Microsatellite Status - MS-Stable Tumor Mutational Burden - TMB-Low (5 Muts/Mb) Genomic Findings For a complete list of the genes assayed, please refer to the Appendix. EGFR S768I, V769L CDKN2A/B loss MTAP loss exons 6-8 NFKBIA amplification NKX2-1 amplification TP53 R337L 7 Disease relevant genes with no reportable alterations: KRAS, ALK, BRAF, MET, RET, ERBB2, ROS1  PRIOR THERAPY:  1) Stereotactic body radiotherapy to the left upper lobe lung nodule under the care of Dr. Pablo Ledger without biopsy completed 12/14/2015. 2) stereotactic radiotherapy to a solitary brain metastasis under the care of Dr. Lisbeth Renshaw on 04/21/2017. 3) Tagrisso 80 mg by mouth daily, first dose started 04/09/2017. 4) Tagrisso 80 mg by mouth daily and Avastin 15 MG/KG every 3 weeks was added starting 07/09/2017 secondary to disease progression on single agent Tagrisso.  Status post 3 cycles with Avastin.  Last dose was given August 20, 2017 this was discontinued secondary to disease progression.   CURRENT THERAPY: GILOTRIF 30 mg p.o. 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  She started the first dose of this treatment October 12, 2017, status post 3 months of treatment.  INTERVAL HISTORY: [name] 84 y.o. female returns to the clinic today for follow-up visit accompanied by her son.  The patient is feeling fine today with no specific complaints except for skin rash.  She was a started on Medrol Dosepak but she could not tolerate it.  She is currently  applying hydrocortisone cream to the skin rash area.  She denied having any significant diarrhea.  She has no nausea or vomiting.  The patient denied having any fever or chills.  She denied having any chest pain, shortness breath, cough or hemoptysis.  She continues to tolerate her treatment with GILOTRIF fairly well except for the rash.  She is also started losing some hair.  She is here today for evaluation and repeat blood work.   MEDICAL HISTORY: Past Medical History:  Diagnosis Date  . Adenocarcinoma of left lung, stage 4 ([place]) 03/18/2017  . Arthritis    Neck,shoulder  . Brain metastases ([place]) 04/06/2017  . Colonic polyp   . Difficulty sleeping   . Encounter for antineoplastic chemotherapy 04/06/2017  . History of radiation therapy 12/04/15-12/14/15   left upper lobe 60 Gy  . Hx of unilateral nephrectomy 1994   right   . Hyperlipemia   . Lung cancer ([place])   . Numbness in feet   . Peripheral neuropathy   . Personal history of PE (pulmonary embolism) 2011    ALLERGIES:  is allergic to penicillins.  MEDICATIONS:  Current Outpatient Medications  Medication Sig Dispense Refill  . gabapentin (NEURONTIN) 100 MG capsule Take 300 mg by mouth at bedtime. 3 tablets at bedtime    . GILOTRIF 30 MG tablet TAKE 1 TABLET (30 MG TOTAL) BY MOUTH DAILY. TAKE ON AN EMPTY STOMACH 1HR BEFORE OR 2HRS AFTER MEALS. 30 tablet 2  . methylPREDNISolone (MEDROL DOSEPAK) 4 MG TBPK tablet Use as directed 21 tablet 0  . metoprolol succinate (TOPROL-XL) 25 MG 24 hr tablet Take 0.5 tablets (12.5 mg total)  by mouth daily. 45 tablet 3  . Multiple Vitamin (MULTIVITAMIN WITH MINERALS) TABS Take 1 tablet by mouth daily.    . rivaroxaban (XARELTO) 20 MG TABS tablet Take 20 mg by mouth daily with supper.     No current facility-administered medications for this visit.     SURGICAL HISTORY:  Past Surgical History:  Procedure Laterality Date  . ABDOMINAL HYSTERECTOMY    . APPENDECTOMY    . cataracts Bilateral  09/2014   removal  . CESAREAN SECTION    . NEPHRECTOMY  1993   RT -   . TOTAL HIP ARTHROPLASTY Right 02/25/2013   Procedure: RIGHT TOTAL HIP ARTHROPLASTY ANTERIOR APPROACH;  Surgeon: Mcarthur Rossetti, MD;  Location: [facility];  Service: Orthopedics;  Laterality: Right;    REVIEW OF SYSTEMS:  A comprehensive review of systems was negative except for: Integument/breast: positive for rash   PHYSICAL EXAMINATION: General appearance: alert, cooperative, fatigued and no distress Head: Normocephalic, without obvious abnormality, atraumatic Neck: no adenopathy, no JVD, supple, symmetrical, trachea midline and thyroid not enlarged, symmetric, no tenderness/mass/nodules Lymph nodes: Cervical, supraclavicular, and axillary nodes normal. Resp: clear to auscultation bilaterally Back: symmetric, no curvature. ROM normal. No CVA tenderness. Cardio: regular rate and rhythm, S1, S2 normal, no murmur, click, rub or gallop GI: soft, non-tender; bowel sounds normal; no masses,  no organomegaly Extremities: extremities normal, atraumatic, no cyanosis or edema  ECOG PERFORMANCE STATUS: 1 - Symptomatic but completely ambulatory  Blood pressure 136/66, pulse (!) 101, temperature 97.9 F (36.6 C), temperature source Oral, resp. rate 18, height _0  (1.676 m), weight 155 lb 14.4 oz (70.7 kg), SpO2 100 %.  LABORATORY DATA: Lab Results  Component Value Date   WBC 7.2 01/14/2018   HGB 13.9 11/11/2017   HCT 38.2 01/14/2018   MCV 92.5 01/14/2018   PLT 204 01/14/2018      Chemistry      Component Value Date/Time   NA 138 12/10/2017 1056   NA 138 10/08/2017 0916   K 4.7 12/10/2017 1056   K 4.3 10/08/2017 0916   CL 102 12/10/2017 1056   CO2 27 12/10/2017 1056   CO2 24 10/08/2017 0916   BUN 16 12/10/2017 1056   BUN 16.7 10/08/2017 0916   CREATININE 1.19 (H) 12/10/2017 1056   CREATININE 1.1 10/08/2017 0916      Component Value Date/Time   CALCIUM 9.8 12/10/2017 1056   CALCIUM 9.0 10/08/2017 0916     ALKPHOS 422 (H) 12/10/2017 1056   ALKPHOS 339 (H) 10/08/2017 0916   AST 48 (H) 12/10/2017 1056   AST 55 (H) 10/08/2017 0916   ALT 44 12/10/2017 1056   ALT 39 10/08/2017 0916   BILITOT 0.9 12/10/2017 1056   BILITOT 0.99 10/08/2017 0916       RADIOGRAPHIC STUDIES: No results found.  ASSESSMENT AND PLAN:  This is a very pleasant 84 years old African-American female with metastatic non-small cell lung cancer that was initially diagnosed as a stage IA status post stereotactic radiotherapy of the left upper lobe and now she presented with disease recurrence as well as mediastinal lymphadenopathy and liver metastasis and solitary brain metastasis. 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 She underwent stereotactic radiotherapy to a solitary brain metastasis. She is currently on treatment with Tagrisso 80 mg by mouth daily status post 4 months. He was also started recently on Avastin 15 MG/KG every 3 weeks is status post 3 cycles.  This treatment was discontinued secondary  to disease progression. 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  The patient is currently undergoing treatment with GILOTRIF 30 mg p.o. daily status post 3 months of treatment. She is tolerating this treatment well except for the mild skin rash. I recommended for the patient to continue her treatment with GILOTRIF with the same dose. For the skin rash, she will continue on hydrocortisone cream twice daily. The patient will come back for follow-up visit in 1 month for evaluation with repeat blood work. She was advised to call immediately if she has any concerning symptoms in the interval. The patient voices understanding of current disease status and treatment options and is in agreement with the current care plan. All questions were answered. The patient knows to call the clinic with any problems, questions or concerns. We can  certainly see the patient much sooner if necessary.  Disclaimer: This note was dictated with voice recognition software. Similar sounding words can inadvertently be transcribed and may not be corrected upon review.

## 2018-01-14 NOTE — Telephone Encounter (Signed)
Scheduled appt per 3/28 los- Gave patient AVS and calender per los.

## 2018-01-19 ENCOUNTER — Ambulatory Visit: Payer: Medicare Other | Admitting: Neurology

## 2018-02-09 ENCOUNTER — Telehealth: Payer: Self-pay | Admitting: Radiation Therapy

## 2018-02-09 NOTE — Telephone Encounter (Addendum)
Pt describes weakness, "not to dizzy , no energy", feeling bad for the past month". One episode of watery diarrhea today . 'I usually suffer from constipation". Per Julien Nordmann I instructed pt to hold  Gilotrif , keep hydrated and take imodium for increased watery diarrhea. I also told her to keep appt on 02/15/18.IF she feels like she needs to to go ED for worsening symptoms.

## 2018-02-09 NOTE — Telephone Encounter (Signed)
[name] called today requesting a new follow-up appointment with [name] to review her brain MRI. She has not felt well this week, says that she is tired and doesn't think that she would be up to having the MRI, so she rescheduled it to 5/8 instead. She also has a lab appointment and visit with [name] on 4/29 that will need to be rescheduled bu the Med Onc team.   During our conversation she mentioned that she believes the medication prescribed by [name], [name], is what is making her feel bad, so she stopped taking it today. I expressed the importance of reaching out to her doctor before stopping a medication on her own. I called [name] nurse line requesting that someone call her back in response to this.   Brain MRI was rescheduled by the patient to 5/8. Follow-up with [name] has been rescheduled to 5/13  Lab and Med Onc visit with [name] still needs to be rescheduled by Med Onc.    Mont Dutton R.T.(R)(T) Special Procedures Navigator

## 2018-02-10 ENCOUNTER — Telehealth: Payer: Self-pay | Admitting: *Deleted

## 2018-02-10 NOTE — Telephone Encounter (Signed)
SOn called lmovm to call back. Returned call, son states " My mother is not taking her medicine ( Gilotrif ) as she was directed, she is very weak and kidney counts were low, she needed additional fluids, over the weekend she had some back pain. Not eating." " Today she was able to eat some crackers, drink some fluids and says she is feeling better." Discusssed with Son to try ensure/Boost,  meals that are small and simple,. Instructed son to call PCP office and request to bring in a urine sample for possible UTI. Monitor pt's temp, push fluids and call if additional concerns.Son verbalized understanding no further concerns.

## 2018-02-11 ENCOUNTER — Other Ambulatory Visit: Payer: Medicare Other

## 2018-02-14 ENCOUNTER — Other Ambulatory Visit: Payer: Self-pay

## 2018-02-14 ENCOUNTER — Inpatient Hospital Stay (HOSPITAL_COMMUNITY)
Admission: EM | Admit: 2018-02-14 | Discharge: 2018-02-19 | DRG: 690 | Disposition: A | Discharge status: Home Health | Payer: Medicare Other | Attending: Internal Medicine | Admitting: Internal Medicine

## 2018-02-14 ENCOUNTER — Emergency Department (HOSPITAL_COMMUNITY): Payer: Medicare Other

## 2018-02-14 ENCOUNTER — Encounter (HOSPITAL_COMMUNITY): Payer: Self-pay

## 2018-02-14 DIAGNOSIS — D630 Anemia in neoplastic disease: Secondary | ICD-10-CM | POA: Diagnosis present

## 2018-02-14 DIAGNOSIS — K5730 Diverticulosis of large intestine without perforation or abscess without bleeding: Secondary | ICD-10-CM | POA: Diagnosis present

## 2018-02-14 DIAGNOSIS — R55 Syncope and collapse: Secondary | ICD-10-CM | POA: Diagnosis not present

## 2018-02-14 DIAGNOSIS — G629 Polyneuropathy, unspecified: Secondary | ICD-10-CM | POA: Diagnosis present

## 2018-02-14 DIAGNOSIS — C7931 Secondary malignant neoplasm of brain: Secondary | ICD-10-CM | POA: Diagnosis present

## 2018-02-14 DIAGNOSIS — N151 Renal and perinephric abscess: Secondary | ICD-10-CM | POA: Diagnosis present

## 2018-02-14 DIAGNOSIS — D649 Anemia, unspecified: Secondary | ICD-10-CM | POA: Diagnosis not present

## 2018-02-14 DIAGNOSIS — Z96641 Presence of right artificial hip joint: Secondary | ICD-10-CM | POA: Diagnosis present

## 2018-02-14 DIAGNOSIS — K5900 Constipation, unspecified: Secondary | ICD-10-CM | POA: Diagnosis present

## 2018-02-14 DIAGNOSIS — M19019 Primary osteoarthritis, unspecified shoulder: Secondary | ICD-10-CM | POA: Diagnosis present

## 2018-02-14 DIAGNOSIS — B9620 Unspecified Escherichia coli [E. coli] as the cause of diseases classified elsewhere: Secondary | ICD-10-CM | POA: Diagnosis present

## 2018-02-14 DIAGNOSIS — I10 Essential (primary) hypertension: Secondary | ICD-10-CM | POA: Diagnosis present

## 2018-02-14 DIAGNOSIS — N289 Disorder of kidney and ureter, unspecified: Secondary | ICD-10-CM | POA: Diagnosis present

## 2018-02-14 DIAGNOSIS — Z86711 Personal history of pulmonary embolism: Secondary | ICD-10-CM

## 2018-02-14 DIAGNOSIS — N390 Urinary tract infection, site not specified: Secondary | ICD-10-CM | POA: Diagnosis present

## 2018-02-14 DIAGNOSIS — C3492 Malignant neoplasm of unspecified part of left bronchus or lung: Secondary | ICD-10-CM | POA: Diagnosis not present

## 2018-02-14 DIAGNOSIS — D72829 Elevated white blood cell count, unspecified: Secondary | ICD-10-CM | POA: Diagnosis not present

## 2018-02-14 DIAGNOSIS — I700 Atherosclerosis of aorta: Secondary | ICD-10-CM | POA: Diagnosis not present

## 2018-02-14 DIAGNOSIS — R945 Abnormal results of liver function studies: Secondary | ICD-10-CM | POA: Diagnosis present

## 2018-02-14 DIAGNOSIS — M47812 Spondylosis without myelopathy or radiculopathy, cervical region: Secondary | ICD-10-CM | POA: Diagnosis present

## 2018-02-14 DIAGNOSIS — N2889 Other specified disorders of kidney and ureter: Secondary | ICD-10-CM | POA: Diagnosis present

## 2018-02-14 DIAGNOSIS — C787 Secondary malignant neoplasm of liver and intrahepatic bile duct: Secondary | ICD-10-CM | POA: Diagnosis present

## 2018-02-14 DIAGNOSIS — Z9221 Personal history of antineoplastic chemotherapy: Secondary | ICD-10-CM | POA: Diagnosis not present

## 2018-02-14 DIAGNOSIS — Z905 Acquired absence of kidney: Secondary | ICD-10-CM

## 2018-02-14 DIAGNOSIS — Z7901 Long term (current) use of anticoagulants: Secondary | ICD-10-CM | POA: Diagnosis not present

## 2018-02-14 DIAGNOSIS — Z923 Personal history of irradiation: Secondary | ICD-10-CM

## 2018-02-14 DIAGNOSIS — Z87891 Personal history of nicotine dependence: Secondary | ICD-10-CM

## 2018-02-14 DIAGNOSIS — Z79899 Other long term (current) drug therapy: Secondary | ICD-10-CM

## 2018-02-14 DIAGNOSIS — J9811 Atelectasis: Secondary | ICD-10-CM | POA: Diagnosis present

## 2018-02-14 DIAGNOSIS — Z86010 Personal history of colonic polyps: Secondary | ICD-10-CM

## 2018-02-14 DIAGNOSIS — Z880 Allergy status to penicillin: Secondary | ICD-10-CM

## 2018-02-14 DIAGNOSIS — Z888 Allergy status to other drugs, medicaments and biological substances status: Secondary | ICD-10-CM

## 2018-02-14 LAB — CBC WITH DIFFERENTIAL/PLATELET
Basophils Absolute: 0 10*3/uL (ref 0.0–0.1)
Basophils Relative: 0 %
Eosinophils Absolute: 0.2 10*3/uL (ref 0.0–0.7)
Eosinophils Relative: 2 %
HCT: 33.8 % — ABNORMAL LOW (ref 36.0–46.0)
Hemoglobin: 11.8 g/dL — ABNORMAL LOW (ref 12.0–15.0)
Lymphocytes Relative: 16 %
Lymphs Abs: 1.5 10*3/uL (ref 0.7–4.0)
MCH: 30.6 pg (ref 26.0–34.0)
MCHC: 34.9 g/dL (ref 30.0–36.0)
MCV: 87.8 fL (ref 78.0–100.0)
Monocytes Absolute: 1.3 10*3/uL — ABNORMAL HIGH (ref 0.1–1.0)
Monocytes Relative: 14 %
Neutro Abs: 6.3 10*3/uL (ref 1.7–7.7)
Neutrophils Relative %: 68 %
Platelets: 329 10*3/uL (ref 150–400)
RBC: 3.85 MIL/uL — ABNORMAL LOW (ref 3.87–5.11)
RDW: 14.4 % (ref 11.5–15.5)
WBC: 9.2 10*3/uL (ref 4.0–10.5)

## 2018-02-14 LAB — URINALYSIS, ROUTINE W REFLEX MICROSCOPIC
Bacteria, UA: NONE SEEN
Bilirubin Urine: NEGATIVE
Glucose, UA: NEGATIVE mg/dL
Ketones, ur: NEGATIVE mg/dL
Leukocytes, UA: NEGATIVE
Nitrite: NEGATIVE
Protein, ur: NEGATIVE mg/dL
Specific Gravity, Urine: 1.005 (ref 1.005–1.030)
pH: 6 (ref 5.0–8.0)

## 2018-02-14 LAB — LIPASE, BLOOD: Lipase: 38 U/L (ref 11–51)

## 2018-02-14 LAB — COMPREHENSIVE METABOLIC PANEL
ALT: 31 U/L (ref 14–54)
AST: 37 U/L (ref 15–41)
Albumin: 3 g/dL — ABNORMAL LOW (ref 3.5–5.0)
Alkaline Phosphatase: 387 U/L — ABNORMAL HIGH (ref 38–126)
Anion gap: 9 (ref 5–15)
BUN: 12 mg/dL (ref 6–20)
CO2: 25 mmol/L (ref 22–32)
Calcium: 8.8 mg/dL — ABNORMAL LOW (ref 8.9–10.3)
Chloride: 102 mmol/L (ref 101–111)
Creatinine, Ser: 1 mg/dL (ref 0.44–1.00)
GFR calc Af Amer: 57 mL/min — ABNORMAL LOW (ref >60)
GFR calc non Af Amer: 49 mL/min — ABNORMAL LOW (ref >60)
Glucose, Bld: 119 mg/dL — ABNORMAL HIGH (ref 65–99)
Potassium: 3.8 mmol/L (ref 3.5–5.1)
Sodium: 136 mmol/L (ref 135–145)
Total Bilirubin: 0.9 mg/dL (ref 0.3–1.2)
Total Protein: 7.2 g/dL (ref 6.5–8.1)

## 2018-02-14 LAB — I-STAT CG4 LACTIC ACID, ED: LACTIC ACID, VENOUS: 1.05 mmol/L (ref 0.5–1.9)

## 2018-02-14 MED ORDER — SODIUM CHLORIDE 0.9 % IV SOLN
1.0000 g | Freq: Once | INTRAVENOUS | Status: AC
  Administered 2018-02-14: 1 g via INTRAVENOUS
  Filled 2018-02-14: qty 1

## 2018-02-14 MED ORDER — ACETAMINOPHEN 325 MG PO TABS
650.0000 mg | ORAL_TABLET | Freq: Four times a day (QID) | ORAL | Status: DC | PRN
  Administered 2018-02-15: 650 mg via ORAL
  Filled 2018-02-14: qty 2

## 2018-02-14 MED ORDER — SODIUM CHLORIDE 0.9 % IV SOLN: 1.0000 g | Freq: Once | INTRAVENOUS | Status: DC

## 2018-02-14 MED ORDER — PRO-STAT SUGAR FREE PO LIQD
30.0000 mL | Freq: Two times a day (BID) | ORAL | Status: DC
  Administered 2018-02-16 – 2018-02-19 (×5): 30 mL via ORAL
  Filled 2018-02-14 (×7): qty 30

## 2018-02-14 MED ORDER — SODIUM CHLORIDE 0.9 % IV BOLUS
500.0000 mL | Freq: Once | INTRAVENOUS | Status: AC
  Administered 2018-02-14: 500 mL via INTRAVENOUS

## 2018-02-14 MED ORDER — IOPAMIDOL (ISOVUE-300) INJECTION 61%
100.0000 mL | Freq: Once | INTRAVENOUS | Status: AC | PRN
  Administered 2018-02-14: 100 mL via INTRAVENOUS

## 2018-02-14 MED ORDER — SODIUM CHLORIDE 0.9 % IV SOLN
INTRAVENOUS | Status: DC
  Administered 2018-02-15: 02:00:00 via INTRAVENOUS

## 2018-02-14 MED ORDER — ACETAMINOPHEN 650 MG RE SUPP: 650.0000 mg | Freq: Four times a day (QID) | RECTAL | Status: DC | PRN

## 2018-02-14 NOTE — ED Provider Notes (Signed)
[facility] Provider Note   CSN: [id number] Arrival date & time: 02/14/18  1936     History   Chief Complaint Chief Complaint  Patient presents with  . Weakness    HPI [name] is a 84 y.o. female with history of stage IV adenocarcinoma of left lung, brain metastases, hyperlipidemia, peripheral neuropathy presents for evaluation of gradual onset, progressively worsening generalized weakness and fatigue for 2 weeks.  She notes decreased appetite and states that she is hardly eating but attempts to drink so that she can stay hydrated.  She denies fevers or chills but notes several days of dysuria and urgency as well as urinary frequency.  She went to a walk-in clinic on Wednesday and was diagnosed with UTI and started on ciprofloxacin twice daily for 7 days.  She is on day 5 of 8 and notes no improvement in her symptoms.  She notes a dull pain to the midline abdomen which does not radiate and is intermittent.  No aggravating or alleviating factors noted.  She denies chest pain but endorses intermittent shortness of breath which she states is not uncommon for her.  Denies cough.  The history is provided by the patient.    Past Medical History:  Diagnosis Date  . Adenocarcinoma of left lung, stage 4 ([place]) 03/18/2017  . Arthritis    Neck,shoulder  . Brain metastases ([place]) 04/06/2017  . Colonic polyp   . Difficulty sleeping   . Encounter for antineoplastic chemotherapy 04/06/2017  . History of radiation therapy 12/04/15-12/14/15   left upper lobe 60 Gy  . Hx of unilateral nephrectomy 1994   right   . Hyperlipemia   . Lung cancer ([place])   . Numbness in feet   . Peripheral neuropathy   . Personal history of PE (pulmonary embolism) 2011    Patient Active Problem List   Diagnosis Date Noted  . Renal lesion 02/14/2018  . Anemia 02/14/2018  . Metastatic adenocarcinoma to brain ([place]) 11/18/2017  . UTI (urinary tract infection) 11/12/2017  .  Malnutrition of moderate degree 11/12/2017  . Stroke ([place]) 11/11/2017  . Physical deconditioning 10/27/2017  . Encounter for antineoplastic chemotherapy 04/06/2017  . Goals of care, counseling/discussion 04/06/2017  . Brain metastases ([place]) 04/06/2017  . Adenocarcinoma of left lung, stage 4 ([place]) 03/18/2017  . Liver mass 03/09/2017  . Solitary pulmonary nodule 10/17/2015  . Tachycardia 06/19/2014  . Chronic anticoagulation 06/19/2014  . History of pulmonary embolism 06/19/2014  . Degenerative arthritis of hip 02/25/2013  . Hereditary and idiopathic peripheral neuropathy 01/05/2013  . Hip pain 01/05/2013    Past Surgical History:  Procedure Laterality Date  . ABDOMINAL HYSTERECTOMY    . APPENDECTOMY    . cataracts Bilateral 09/2014   removal  . CESAREAN SECTION    . NEPHRECTOMY  1993   RT -   . TOTAL HIP ARTHROPLASTY Right 02/25/2013   Procedure: RIGHT TOTAL HIP ARTHROPLASTY ANTERIOR APPROACH;  Surgeon: Mcarthur Rossetti, MD;  Location: [facility];  Service: Orthopedics;  Laterality: Right;     OB History   None      Home Medications    Prior to Admission medications   Medication Sig Start Date End Date Taking? Authorizing Provider  ciprofloxacin (CIPRO) 250 MG tablet Take 250 mg by mouth 2 (two) times daily.   Yes [provider]  gabapentin (NEURONTIN) 100 MG capsule Take 300 mg by mouth at bedtime.    Yes [provider]  GILOTRIF 30 MG tablet TAKE 1 TABLET (30 MG TOTAL) BY MOUTH DAILY. TAKE ON AN EMPTY STOMACH 1HR BEFORE OR 2HRS AFTER MEALS. 01/11/18  Yes Curt Bears, MD  metoprolol succinate (TOPROL-XL) 25 MG 24 hr tablet Take 0.5 tablets (12.5 mg total) by mouth daily. 11/14/17  Yes Patrecia Pour, Christean Grief, MD  rivaroxaban (XARELTO) 20 MG TABS tablet Take 20 mg by mouth daily with supper.   Yes [provider]  methylPREDNISolone (MEDROL DOSEPAK) 4 MG TBPK tablet Use as directed Patient not taking: Reported on 02/14/2018 12/28/17   Curt Bears, MD    Family History Family History  Problem Relation Age of Onset  . Hypertension Father   . Diabetes Father   . Hypertension Mother   . Colon cancer Sister     Social History Social History   Tobacco Use  . Smoking status: Former Smoker    Packs/day: 0.25    Years: 20.00    Pack years: 5.00    Types: Cigarettes    Last attempt to quit: 10/20/1989    Years since quitting: 28.3  . Smokeless tobacco: Never Used  Substance Use Topics  . Alcohol use: No    Alcohol/week: 0.0 oz  . Drug use: No     Allergies   Prednisone and Penicillins   Review of Systems Review of Systems  Constitutional: Positive for fatigue. Negative for chills and fever.  Respiratory: Positive for shortness of breath.   Cardiovascular: Negative for chest pain.  Gastrointestinal: Positive for abdominal pain and constipation. Negative for diarrhea, nausea and vomiting.  All other systems reviewed and are negative.    Physical Exam Updated Vital Signs BP 135/75 (BP Location: Right Arm)   Pulse 94   Temp 98.6 F (37 C) (Oral)   Resp (!) 24   Ht 5\' 6"  (1.676 m)   Wt 69.6 kg (153 lb 7 oz)   SpO2 100%   BMI 24.77 kg/m   Physical Exam  Constitutional: She appears well-developed and well-nourished. No distress.  Thin, resting comfortably in bed  HENT:  Head: Normocephalic and atraumatic.  Eyes: Conjunctivae are normal. Right eye exhibits no discharge. Left eye exhibits no discharge.  Neck: No JVD present. No tracheal deviation present.  Cardiovascular: Normal rate, regular rhythm, normal heart sounds and intact distal pulses.  Pulmonary/Chest: Effort normal and breath sounds normal.  Abdominal: Soft. Bowel sounds are normal. She exhibits no distension. There is tenderness. There is no guarding.  Mild tenderness to palpation in the epigastric and left upper quadrant regions as well as the suprapubic region.  No CVA tenderness, Murphy sign absent, Rovsing's absent  Musculoskeletal:  Normal range of motion. She exhibits no edema.  Neurological: She is alert. No cranial nerve deficit or sensory deficit.  Skin: Skin is warm and dry. No erythema.  Psychiatric: She has a normal mood and affect. Her behavior is normal.  Nursing note and vitals reviewed.    ED Treatments / Results  Labs (all labs ordered are listed, but only abnormal results are displayed) Labs Reviewed  COMPREHENSIVE METABOLIC PANEL - Abnormal; Notable for the following components:      Result Value   Glucose, Bld 119 (*)    Calcium 8.8 (*)    Albumin 3.0 (*)    Alkaline Phosphatase 387 (*)    GFR calc non Af Amer 49 (*)    GFR calc Af Amer 57 (*)    All other components within normal limits  CBC WITH DIFFERENTIAL/PLATELET - Abnormal;  Notable for the following components:   RBC 3.85 (*)    Hemoglobin 11.8 (*)    HCT 33.8 (*)    Monocytes Absolute 1.3 (*)    All other components within normal limits  URINALYSIS, ROUTINE W REFLEX MICROSCOPIC - Abnormal; Notable for the following components:   Hgb urine dipstick SMALL (*)    All other components within normal limits  URINE CULTURE  CULTURE, BLOOD (ROUTINE X 2)  CULTURE, BLOOD (ROUTINE X 2)  LIPASE, BLOOD  COMPREHENSIVE METABOLIC PANEL  HEPARIN LEVEL (UNFRACTIONATED)  APTT  CBC  I-STAT CG4 LACTIC ACID, ED  I-STAT CG4 LACTIC ACID, ED    EKG EKG Interpretation  Date/Time:  Sunday February 14 2018 20:37:32 EDT Ventricular Rate:  89 PR Interval:    QRS Duration: 86 QT Interval:  358 QTC Calculation: 436 R Axis:   42 Text Interpretation:  Sinus rhythm Borderline repolarization abnormality Baseline wander in lead(s) V3 Confirmed by [name] Ferguson [phone]) on 02/14/2018 9:38:30 PM   Radiology Dg Chest 2 View  Result Date: 02/14/2018 CLINICAL DATA:  Shortness of breath. EXAM: CHEST - 2 VIEW COMPARISON:  Chest CT 12/10/2017 FINDINGS: Borderline heart size. Negative aortic and hilar contours. Hazy nodule at the left apex that is underestimated  relative to comparison CT. There is no edema, consolidation, effusion, or pneumothorax. Exaggerated thoracic kyphosis. No acute osseous finding. IMPRESSION: No evidence of active disease Electronically Signed   By: Monte Fantasia M.D.   On: 02/14/2018 21:30   Ct Abdomen Pelvis W Contrast  Result Date: 02/14/2018 CLINICAL DATA:  Acute generalized abdominal pain. Pain is getting worse. Painful urination. Being treated for urinary tract infection. EXAM: CT ABDOMEN AND PELVIS WITH CONTRAST TECHNIQUE: Multidetector CT imaging of the abdomen and pelvis was performed using the standard protocol following bolus administration of intravenous contrast. CONTRAST:  128mL ISOVUE-300 IOPAMIDOL (ISOVUE-300) INJECTION 61% COMPARISON:  12/10/2017 FINDINGS: Lower chest: Linear fibrosis or atelectasis demonstrated in the lung bases. Diffuse bronchial wall thickening suggesting chronic bronchitis. Nodule in the right lung base posteriorly associated with linear scarring and measuring about 6 mm diameter. This appears more prominent than the previous study and could be inflammatory or developing metastasis. Small esophageal hiatal hernia. Hepatobiliary: Multiple heterogeneous low-attenuation mass is demonstrated throughout the liver. The largest encompasses most of the posterior right lobe and measures up to about 6 cm in diameter. Appearances are similar to previous study and likely representing metastatic disease. Gallbladder and bile ducts are unremarkable. Pancreas: Unremarkable. No pancreatic ductal dilatation or surrounding inflammatory changes. Spleen: Calcified granuloma in the spleen.  No other focal lesions. Adrenals/Urinary Tract: No adrenal gland nodules. Surgical absence of the right kidney. No mass or fluid collection in the right renal fossa. Heterogeneous low-attenuation mass in the midpole of the left kidney measures about 3.2 cm in diameter and has peripheral enhancement. This is new since the previous study.  Given the short interval time of development, this likely represents an abscess. Renal cell carcinoma would be another consideration but less likely given the timing. No hydronephrosis or hydroureter. Bladder is unremarkable. Stomach/Bowel: Stomach, small bowel, and colon are mostly decompressed. Scattered stool in the colon. Sigmoid colonic diverticula without evidence of diverticulitis. Appendiceal stump. Surgical absence of the appendix. No inflammatory wall thickening present. Vascular/Lymphatic: Aortic atherosclerosis. No enlarged abdominal or pelvic lymph nodes. Reproductive: Status post hysterectomy. No adnexal masses. Other: No abdominal wall hernia or abnormality. No abdominopelvic ascites. Musculoskeletal: Degenerative changes in the spine. Postoperative right hip arthroplasty. No destructive bone  lesions. IMPRESSION: 1. Fibrosis or atelectasis in the lung bases. Diffuse bronchial wall thickening suggesting chronic bronchitis. 2. 6 mm nodule in the right lung base associated with linear scarring is more prominent than previous study and could represent inflammatory process or developing metastatic nodule. 3. Multiple hypoenhancing liver lesions, largest measuring up to about 6 cm diameter. Similar to previous study. Likely metastatic disease. 4. There is a new 3.2 cm hypodense mass in the left kidney with peripheral enhancement. This is most likely to represent a renal abscess. Renal cell carcinoma is less likely due to development of the lesion in a short interval since previous study. 5. Aortic atherosclerosis. 6. Diverticulosis of the colon without evidence of diverticulitis. Electronically Signed   By: Lucienne Capers M.D.   On: 02/14/2018 22:11    Procedures Procedures (including critical care time)  Medications Ordered in ED Medications  0.9 %  sodium chloride infusion (has no administration in time range)  acetaminophen (TYLENOL) tablet 650 mg (has no administration in time range)    Or    acetaminophen (TYLENOL) suppository 650 mg (has no administration in time range)  feeding supplement (PRO-STAT SUGAR FREE 64) liquid 30 mL (has no administration in time range)  gabapentin (NEURONTIN) capsule 300 mg (has no administration in time range)  metoprolol succinate (TOPROL-XL) 24 hr tablet 12.5 mg (has no administration in time range)  meropenem (MERREM) 1 g in sodium chloride 0.9 % 100 mL IVPB (has no administration in time range)  heparin ADULT infusion 100 units/mL (25000 units/225mL sodium chloride 0.45%) (has no administration in time range)  heparin bolus via infusion 3,000 Units (has no administration in time range)  sodium chloride 0.9 % bolus 500 mL (0 mLs Intravenous Stopped 02/14/18 2129)  iopamidol (ISOVUE-300) 61 % injection 100 mL (100 mLs Intravenous Contrast Given 02/14/18 2128)  aztreonam (AZACTAM) 1 g in sodium chloride 0.9 % 100 mL IVPB (0 g Intravenous Stopped 02/14/18 2350)     Initial Impression / Assessment and Plan / ED Course  I have reviewed the triage vital signs and the nursing notes.  Pertinent labs & imaging results that were available during my care of the patient were reviewed by me and considered in my medical decision making (see chart for details).     Patient with history of metastatic lung cancer presents for evaluation of 2 weeks of generalized fatigue and urinary symptoms.  She is currently on day 5 of 7-day course of Cipro for UTI.  She is afebrile, mildly tachypneic and hypertensive in the ED but otherwise vital signs are stable.  She is thin and somewhat febrile but otherwise well-appearing.  Lab work shows no leukocytosis although WBC count is elevated as compared to the patient's baseline.  Creatinine is within normal limits.  No significant electrolyte abnormalities.  Lipase within normal limits.  Will obtain CT scan for further evaluation.  CT scan of the abdomen and pelvis shows a new 3.2 cm left renal mass suggestive of abscess.  Also shows  progression of the patient's lung cancer.  UA is not suggestive of UTI or nephrolithiasis.  Spoke with Dr. Louis Meckel with urology who recommends medical admission with IR drainage tomorrow.  Spoke with Dr. Maudie Mercury with [facility] hospitalist service who agrees to assume care of patient and bring her into the hospital for further evaluation and management.  Initiate IV antibiotics in the ED.  Final Clinical Impressions(s) / ED Diagnoses   Final diagnoses:  Renal abscess, left    ED Discharge  Orders    None       [name] 02/15/18 Eula Flax, MD 02/19/18 1711

## 2018-02-14 NOTE — ED Triage Notes (Signed)
States being treated for a UTI but states she is getting worse and has weakness voiced states painful urination.

## 2018-02-14 NOTE — ED Notes (Signed)
ED TO INPATIENT HANDOFF REPORT  Name/Age/Gender [name] 84 y.o. female  Code Status Code Status History    Date Active Date Inactive Code Status Order ID Comments User Context   11/12/2017 0117 11/13/2017 1626 Full Code [id number]  Jani Gravel, MD Inpatient   02/25/2013 1353 02/28/2013 1703 Full Code [id number]  Mcarthur Rossetti, MD Inpatient      Home/SNF/Other Home  Chief Complaint ca patient Weakness, Urinary Complaint   Level of Care/Admitting Diagnosis ED Disposition    ED Disposition Condition [facility]: [facility] [[id number]]  Level of Care: Telemetry [5]  Admit to tele based on following criteria: Monitor for Ischemic changes  Diagnosis: Renal lesion [350093]  Admitting Physician: Jani Gravel [[id number]]  Attending Physician: Jani Gravel [phone]  Estimated length of stay: past midnight tomorrow  Certification:: I certify this patient will need inpatient services for at least 2 midnights  PT Class (Do Not Modify): Inpatient [101]  PT Acc Code (Do Not Modify): Private [1]       Medical History Past Medical History:  Diagnosis Date  . Adenocarcinoma of left lung, stage 4 ([place]) 03/18/2017  . Arthritis    Neck,shoulder  . Brain metastases ([place]) 04/06/2017  . Colonic polyp   . Difficulty sleeping   . Encounter for antineoplastic chemotherapy 04/06/2017  . History of radiation therapy 12/04/15-12/14/15   left upper lobe 60 Gy  . Hx of unilateral nephrectomy 1994   right   . Hyperlipemia   . Lung cancer ([place])   . Numbness in feet   . Peripheral neuropathy   . Personal history of PE (pulmonary embolism) 2011    Allergies Allergies  Allergen Reactions  . Prednisone   . Penicillins Rash    Has patient had a PCN reaction causing immediate rash, facial/tongue/throat swelling, SOB or lightheadedness with hypotension: Unknown Has patient had a PCN reaction causing severe rash involving mucus membranes or skin necrosis:  Unknown Has patient had a PCN reaction that required hospitalization: Unknown Has patient had a PCN reaction occurring within the last 10 years: Unknown If all of the above answers are "NO", then may proceed with Cephalosporin use.     IV Location/Drains/Wounds Patient Lines/Drains/Airways Status   Active Line/Drains/Airways    Name:   Placement date:   Placement time:   Site:   Days:   Peripheral IV 02/14/18 Left Antecubital   02/14/18    2030    Antecubital   less than 1   Incision 02/25/13 Hip Right   02/25/13    1010     1815   Incision (Closed) 09/17/17 Abdomen Right   09/17/17    1334     150          Labs/Imaging Results for orders placed or performed during the hospital encounter of 02/14/18 (from the past 48 hour(s))  Urinalysis, Routine w reflex microscopic     Status: Abnormal   Collection Time: 02/14/18  7:51 PM  Result Value Ref Range   Color, Urine YELLOW YELLOW   APPearance CLEAR CLEAR   Specific Gravity, Urine 1.005 1.005 - 1.030   pH 6.0 5.0 - 8.0   Glucose, UA NEGATIVE NEGATIVE mg/dL   Hgb urine dipstick SMALL (A) NEGATIVE   Bilirubin Urine NEGATIVE NEGATIVE   Ketones, ur NEGATIVE NEGATIVE mg/dL   Protein, ur NEGATIVE NEGATIVE mg/dL   Nitrite NEGATIVE NEGATIVE   Leukocytes, UA NEGATIVE NEGATIVE   RBC / HPF 0-5 0 -  5 RBC/hpf   WBC, UA 0-5 0 - 5 WBC/hpf   Bacteria, UA NONE SEEN NONE SEEN    Comment: Performed at [facility], [place] [address], [place], [place] [address]  Comprehensive metabolic panel     Status: Abnormal   Collection Time: 02/14/18  8:25 PM  Result Value Ref Range   Sodium 136 135 - 145 mmol/L   Potassium 3.8 3.5 - 5.1 mmol/L   Chloride 102 101 - 111 mmol/L   CO2 25 22 - 32 mmol/L   Glucose, Bld 119 (H) 65 - 99 mg/dL   BUN 12 6 - 20 mg/dL   Creatinine, Ser 1.00 0.44 - 1.00 mg/dL   Calcium 8.8 (L) 8.9 - 10.3 mg/dL   Total Protein 7.2 6.5 - 8.1 g/dL   Albumin 3.0 (L) 3.5 - 5.0 g/dL   AST 37 15 - 41 U/L   ALT 31 14 -  54 U/L   Alkaline Phosphatase 387 (H) 38 - 126 U/L   Total Bilirubin 0.9 0.3 - 1.2 mg/dL   GFR calc non Af Amer 49 (L) >60 mL/min   GFR calc Af Amer 57 (L) >60 mL/min    Comment: (NOTE) The eGFR has been calculated using the CKD EPI equation. This calculation has not been validated in all clinical situations. eGFR's persistently <60 mL/min signify possible Chronic Kidney Disease.    Anion gap 9 5 - 15    Comment: Performed at [facility], [place] [address], [place], [place] [address]  Lipase, blood     Status: None   Collection Time: 02/14/18  8:25 PM  Result Value Ref Range   Lipase 38 11 - 51 U/L    Comment: Performed at [facility], [place] [address], [place], [place] [address]  CBC with Differential     Status: Abnormal   Collection Time: 02/14/18  8:25 PM  Result Value Ref Range   WBC 9.2 4.0 - 10.5 K/uL   RBC 3.85 (L) 3.87 - 5.11 MIL/uL   Hemoglobin 11.8 (L) 12.0 - 15.0 g/dL   HCT 33.8 (L) 36.0 - 46.0 %   MCV 87.8 78.0 - 100.0 fL   MCH 30.6 26.0 - 34.0 pg   MCHC 34.9 30.0 - 36.0 g/dL   RDW 14.4 11.5 - 15.5 %   Platelets 329 150 - 400 K/uL   Neutrophils Relative % 68 %   Neutro Abs 6.3 1.7 - 7.7 K/uL   Lymphocytes Relative 16 %   Lymphs Abs 1.5 0.7 - 4.0 K/uL   Monocytes Relative 14 %   Monocytes Absolute 1.3 (H) 0.1 - 1.0 K/uL   Eosinophils Relative 2 %   Eosinophils Absolute 0.2 0.0 - 0.7 K/uL   Basophils Relative 0 %   Basophils Absolute 0.0 0.0 - 0.1 K/uL    Comment: Performed at [facility], [place] [address] [place], [place] [address]  I-Stat CG4 Lactic Acid, ED     Status: None   Collection Time: 02/14/18  8:32 PM  Result Value Ref Range   Lactic Acid, Venous 1.05 0.5 - 1.9 mmol/L   Dg Chest 2 View  Result Date: 02/14/2018 CLINICAL DATA:  Shortness of breath. EXAM: CHEST - 2 VIEW COMPARISON:  Chest CT 12/10/2017 FINDINGS: Borderline heart size. Negative aortic and hilar contours. Hazy nodule at the left  apex that is underestimated relative to comparison CT. There is no edema, consolidation, effusion, or pneumothorax. Exaggerated thoracic kyphosis. No acute osseous finding.  IMPRESSION: No evidence of active disease Electronically Signed   By: Monte Fantasia M.D.   On: 02/14/2018 21:30   Ct Abdomen Pelvis W Contrast  Result Date: 02/14/2018 CLINICAL DATA:  Acute generalized abdominal pain. Pain is getting worse. Painful urination. Being treated for urinary tract infection. EXAM: CT ABDOMEN AND PELVIS WITH CONTRAST TECHNIQUE: Multidetector CT imaging of the abdomen and pelvis was performed using the standard protocol following bolus administration of intravenous contrast. CONTRAST:  112m ISOVUE-300 IOPAMIDOL (ISOVUE-300) INJECTION 61% COMPARISON:  12/10/2017 FINDINGS: Lower chest: Linear fibrosis or atelectasis demonstrated in the lung bases. Diffuse bronchial wall thickening suggesting chronic bronchitis. Nodule in the right lung base posteriorly associated with linear scarring and measuring about 6 mm diameter. This appears more prominent than the previous study and could be inflammatory or developing metastasis. Small esophageal hiatal hernia. Hepatobiliary: Multiple heterogeneous low-attenuation mass is demonstrated throughout the liver. The largest encompasses most of the posterior right lobe and measures up to about 6 cm in diameter. Appearances are similar to previous study and likely representing metastatic disease. Gallbladder and bile ducts are unremarkable. Pancreas: Unremarkable. No pancreatic ductal dilatation or surrounding inflammatory changes. Spleen: Calcified granuloma in the spleen.  No other focal lesions. Adrenals/Urinary Tract: No adrenal gland nodules. Surgical absence of the right kidney. No mass or fluid collection in the right renal fossa. Heterogeneous low-attenuation mass in the midpole of the left kidney measures about 3.2 cm in diameter and has peripheral enhancement. This is new  since the previous study. Given the short interval time of development, this likely represents an abscess. Renal cell carcinoma would be another consideration but less likely given the timing. No hydronephrosis or hydroureter. Bladder is unremarkable. Stomach/Bowel: Stomach, small bowel, and colon are mostly decompressed. Scattered stool in the colon. Sigmoid colonic diverticula without evidence of diverticulitis. Appendiceal stump. Surgical absence of the appendix. No inflammatory wall thickening present. Vascular/Lymphatic: Aortic atherosclerosis. No enlarged abdominal or pelvic lymph nodes. Reproductive: Status post hysterectomy. No adnexal masses. Other: No abdominal wall hernia or abnormality. No abdominopelvic ascites. Musculoskeletal: Degenerative changes in the spine. Postoperative right hip arthroplasty. No destructive bone lesions. IMPRESSION: 1. Fibrosis or atelectasis in the lung bases. Diffuse bronchial wall thickening suggesting chronic bronchitis. 2. 6 mm nodule in the right lung base associated with linear scarring is more prominent than previous study and could represent inflammatory process or developing metastatic nodule. 3. Multiple hypoenhancing liver lesions, largest measuring up to about 6 cm diameter. Similar to previous study. Likely metastatic disease. 4. There is a new 3.2 cm hypodense mass in the left kidney with peripheral enhancement. This is most likely to represent a renal abscess. Renal cell carcinoma is less likely due to development of the lesion in a short interval since previous study. 5. Aortic atherosclerosis. 6. Diverticulosis of the colon without evidence of diverticulitis. Electronically Signed   By: WLucienne CapersM.D.   On: 02/14/2018 22:11    Pending Labs Unresulted Labs (From admission, onward)   Start     Ordered   02/14/18 2255  Culture, blood (routine x 2)  BLOOD CULTURE X 2,   R     02/14/18 2254   02/14/18 1951  Urine culture  STAT,   STAT     02/14/18  1950      Vitals/Pain Today's Vitals   02/14/18 1943 02/14/18 1948 02/14/18 2000 02/14/18 2116  BP:  137/77 135/77 (!) 143/73  Pulse:  90 89 94  Resp:  (!) 21 (!) 25 (!)  24  Temp:  98.3 F (36.8 C)    TempSrc:  Oral    SpO2:  100% 99% 95%  Weight: 155 lb (70.3 kg)     Height: _0  (1.753 m)     PainSc:    0-No pain    Isolation Precautions No active isolations  Medications Medications  aztreonam (AZACTAM) 1 g in sodium chloride 0.9 % 100 mL IVPB (1 g Intravenous New Bag/Given 02/14/18 2308)  sodium chloride 0.9 % bolus 500 mL (0 mLs Intravenous Stopped 02/14/18 2129)  iopamidol (ISOVUE-300) 61 % injection 100 mL (100 mLs Intravenous Contrast Given 02/14/18 2128)    Mobility walks with assistance

## 2018-02-14 NOTE — H&P (Signed)
TRH H&P   Patient Demographics:    [name], is a 84 y.o. female  MRN: [id number]   DOB - [birth year]  Admit Date - 02/14/2018  Outpatient Primary MD for the patient is Derinda Late, MD  Referring MD/NP/PA: Rodell Perna PA  Outpatient Specialists:     Patient coming from: home  Chief Complaint  Patient presents with  . Weakness      HPI:    [name]  is a 84 y.o. female, w stage 4 adenocarcinoma left lung, h/o PE, peripheral neuropathy, h/o unilateral nephrectomy 1994 apparently presents with c/o fatigue as well as left sided abdominal discomfort. Pt is somewhat of a poor historian.  Pt denies fever, chills, cough, cp, palp, n/v, diarrhea, brbpr, black stool, dysuria, hematuria.   In ED,  CT scan abd/ pelvis IMPRESSION: 1. Fibrosis or atelectasis in the lung bases. Diffuse bronchial wall thickening suggesting chronic bronchitis. 2. 6 mm nodule in the right lung base associated with linear scarring is more prominent than previous study and could represent inflammatory process or developing metastatic nodule. 3. Multiple hypoenhancing liver lesions, largest measuring up to about 6 cm diameter. Similar to previous study. Likely metastatic disease. 4. There is a new 3.2 cm hypodense mass in the left kidney with peripheral enhancement. This is most likely to represent a renal abscess. Renal cell carcinoma is less likely due to development of the lesion in a short interval since previous study. 5. Aortic atherosclerosis. 6. Diverticulosis of the colon without evidence of diverticulitis.  CXR IMPRESSION: No evidence of active disease  Urinalysis negative  Na 136, K 3.8, Bun 12, Creatinine 1.00,  Ast 37, Alt 31 Lipase 38 Wbc 9.2, Hgb 11.8, Plt 329  Per Pa, she discussed case with Dr. Louis Meckel who recommended consulting IR for drainage.   Pt will be admitted for  possible renal abscess    Review of systems:    In addition to the HPI above,  No Fever-chills, No Headache, No changes with Vision or hearing, No problems swallowing food or Liquids, No Chest pain, Cough or Shortness of Breath, No Nausea or Vommitting, Bowel movements are regular, No Blood in stool or Urine, No dysuria, No new skin rashes or bruises, No new joints pains-aches,  No new weakness, tingling, numbness in any extremity, No recent weight gain or loss, No polyuria, polydypsia or polyphagia, No significant Mental Stressors.  A full 10 point Review of Systems was done, except as stated above, all other Review of Systems were negative.   With Past History of the following :    Past Medical History:  Diagnosis Date  . Adenocarcinoma of left lung, stage 4 ([place]) 03/18/2017  . Arthritis    Neck,shoulder  . Brain metastases ([place]) 04/06/2017  . Colonic polyp   . Difficulty sleeping   . Encounter for antineoplastic chemotherapy 04/06/2017  . History of radiation therapy  12/04/15-12/14/15   left upper lobe 60 Gy  . Hx of unilateral nephrectomy 1994   right   . Hyperlipemia   . Lung cancer ([place])   . Numbness in feet   . Peripheral neuropathy   . Personal history of PE (pulmonary embolism) 2011      Past Surgical History:  Procedure Laterality Date  . ABDOMINAL HYSTERECTOMY    . APPENDECTOMY    . cataracts Bilateral 09/2014   removal  . CESAREAN SECTION    . NEPHRECTOMY  1993   RT -   . TOTAL HIP ARTHROPLASTY Right 02/25/2013   Procedure: RIGHT TOTAL HIP ARTHROPLASTY ANTERIOR APPROACH;  Surgeon: Mcarthur Rossetti, MD;  Location: [facility];  Service: Orthopedics;  Laterality: Right;      Social History:     Social History   Tobacco Use  . Smoking status: Former Smoker    Packs/day: 0.25    Years: 20.00    Pack years: 5.00    Types: Cigarettes    Last attempt to quit: 10/20/1989    Years since quitting: 28.3  . Smokeless tobacco: Never Used  Substance Use  Topics  . Alcohol use: No    Alcohol/week: 0.0 oz     Lives - at home  Mobility - walks by self   Family History :     Family History  Problem Relation Age of Onset  . Hypertension Father   . Diabetes Father   . Hypertension Mother   . Colon cancer Sister        Home Medications:   Prior to Admission medications   Medication Sig Start Date End Date Taking? Authorizing Provider  ciprofloxacin (CIPRO) 250 MG tablet Take 250 mg by mouth 2 (two) times daily.   Yes [provider]  gabapentin (NEURONTIN) 100 MG capsule Take 300 mg by mouth at bedtime.    Yes [provider]  GILOTRIF 30 MG tablet TAKE 1 TABLET (30 MG TOTAL) BY MOUTH DAILY. TAKE ON AN EMPTY STOMACH 1HR BEFORE OR 2HRS AFTER MEALS. 01/11/18  Yes Curt Bears, MD  metoprolol succinate (TOPROL-XL) 25 MG 24 hr tablet Take 0.5 tablets (12.5 mg total) by mouth daily. 11/14/17  Yes Patrecia Pour, Christean Grief, MD  rivaroxaban (XARELTO) 20 MG TABS tablet Take 20 mg by mouth daily with supper.   Yes [provider]  methylPREDNISolone (MEDROL DOSEPAK) 4 MG TBPK tablet Use as directed Patient not taking: Reported on 02/14/2018 12/28/17   Curt Bears, MD     Allergies:     Allergies  Allergen Reactions  . Prednisone   . Penicillins Rash    Has patient had a PCN reaction causing immediate rash, facial/tongue/throat swelling, SOB or lightheadedness with hypotension: Unknown Has patient had a PCN reaction causing severe rash involving mucus membranes or skin necrosis: Unknown Has patient had a PCN reaction that required hospitalization: Unknown Has patient had a PCN reaction occurring within the last 10 years: Unknown If all of the above answers are "NO", then may proceed with Cephalosporin use.      Physical Exam:   Vitals  Blood pressure (!) 143/73, pulse 94, temperature 98.3 F (36.8 C), temperature source Oral, resp. rate (!) 24, height 5\' 9"  (1.753 m), weight 70.3 kg (155 lb), SpO2 95  %.   1. General  lying in bed in NAD,   2. Normal affect and insight, Not Suicidal or Homicidal, Awake Alert, Oriented X 3.  3. No F.N deficits, ALL C.Nerves Intact, Strength 5/5  all 4 extremities, Sensation intact all 4 extremities, Plantars down going.  4. Ears and Eyes appear Normal, Conjunctivae clear, PERRLA. Moist Oral Mucosa.  5. Supple Neck, No JVD, No cervical lymphadenopathy appriciated, No Carotid Bruits.  6. Symmetrical Chest wall movement, Good air movement bilaterally, CTAB.  7. RRR, No Gallops, Rubs or Murmurs, No Parasternal Heave.  8. Positive Bowel Sounds, Abdomen Soft, No tenderness, No organomegaly appriciated,No rebound -guarding or rigidity.  9.  No Cyanosis, Normal Skin Turgor, No Skin Rash or Bruise.  10. Good muscle tone,  joints appear normal , no effusions, Normal ROM.  11. No Palpable Lymph Nodes in Neck or Axillae     Data Review:    CBC Recent Labs  Lab 02/14/18 2025  WBC 9.2  HGB 11.8*  HCT 33.8*  PLT 329  MCV 87.8  MCH 30.6  MCHC 34.9  RDW 14.4  LYMPHSABS 1.5  MONOABS 1.3*  EOSABS 0.2  BASOSABS 0.0   ------------------------------------------------------------------------------------------------------------------  Chemistries  Recent Labs  Lab 02/14/18 2025  NA 136  K 3.8  CL 102  CO2 25  GLUCOSE 119*  BUN 12  CREATININE 1.00  CALCIUM 8.8*  AST 37  ALT 31  ALKPHOS 387*  BILITOT 0.9   ------------------------------------------------------------------------------------------------------------------ estimated creatinine clearance is 40.6 mL/min (by C-G formula based on SCr of 1 mg/dL). ------------------------------------------------------------------------------------------------------------------ No results for input(s): TSH, T4TOTAL, T3FREE, THYROIDAB in the last 72 hours.  Invalid input(s): FREET3  Coagulation profile No results for input(s): INR, PROTIME in the last 168  hours. ------------------------------------------------------------------------------------------------------------------- No results for input(s): DDIMER in the last 72 hours. -------------------------------------------------------------------------------------------------------------------  Cardiac Enzymes No results for input(s): CKMB, TROPONINI, MYOGLOBIN in the last 168 hours.  Invalid input(s): CK ------------------------------------------------------------------------------------------------------------------ No results found for: BNP   ---------------------------------------------------------------------------------------------------------------  Urinalysis    Component Value Date/Time   COLORURINE YELLOW 02/14/2018 1951   APPEARANCEUR CLEAR 02/14/2018 1951   LABSPEC 1.005 02/14/2018 1951   PHURINE 6.0 02/14/2018 1951   GLUCOSEU NEGATIVE 02/14/2018 1951   HGBUR SMALL (A) 02/14/2018 1951   BILIRUBINUR NEGATIVE 02/14/2018 1951   KETONESUR NEGATIVE 02/14/2018 1951   PROTEINUR NEGATIVE 02/14/2018 1951   UROBILINOGEN 0.2 02/17/2013 1340   NITRITE NEGATIVE 02/14/2018 1951   LEUKOCYTESUR NEGATIVE 02/14/2018 1951    ----------------------------------------------------------------------------------------------------------------   Imaging Results:    Dg Chest 2 View  Result Date: 02/14/2018 CLINICAL DATA:  Shortness of breath. EXAM: CHEST - 2 VIEW COMPARISON:  Chest CT 12/10/2017 FINDINGS: Borderline heart size. Negative aortic and hilar contours. Hazy nodule at the left apex that is underestimated relative to comparison CT. There is no edema, consolidation, effusion, or pneumothorax. Exaggerated thoracic kyphosis. No acute osseous finding. IMPRESSION: No evidence of active disease Electronically Signed   By: Monte Fantasia M.D.   On: 02/14/2018 21:30   Ct Abdomen Pelvis W Contrast  Result Date: 02/14/2018 CLINICAL DATA:  Acute generalized abdominal pain. Pain is getting  worse. Painful urination. Being treated for urinary tract infection. EXAM: CT ABDOMEN AND PELVIS WITH CONTRAST TECHNIQUE: Multidetector CT imaging of the abdomen and pelvis was performed using the standard protocol following bolus administration of intravenous contrast. CONTRAST:  154mL ISOVUE-300 IOPAMIDOL (ISOVUE-300) INJECTION 61% COMPARISON:  12/10/2017 FINDINGS: Lower chest: Linear fibrosis or atelectasis demonstrated in the lung bases. Diffuse bronchial wall thickening suggesting chronic bronchitis. Nodule in the right lung base posteriorly associated with linear scarring and measuring about 6 mm diameter. This appears more prominent than the previous study and could be inflammatory or developing metastasis. Small esophageal hiatal  hernia. Hepatobiliary: Multiple heterogeneous low-attenuation mass is demonstrated throughout the liver. The largest encompasses most of the posterior right lobe and measures up to about 6 cm in diameter. Appearances are similar to previous study and likely representing metastatic disease. Gallbladder and bile ducts are unremarkable. Pancreas: Unremarkable. No pancreatic ductal dilatation or surrounding inflammatory changes. Spleen: Calcified granuloma in the spleen.  No other focal lesions. Adrenals/Urinary Tract: No adrenal gland nodules. Surgical absence of the right kidney. No mass or fluid collection in the right renal fossa. Heterogeneous low-attenuation mass in the midpole of the left kidney measures about 3.2 cm in diameter and has peripheral enhancement. This is new since the previous study. Given the short interval time of development, this likely represents an abscess. Renal cell carcinoma would be another consideration but less likely given the timing. No hydronephrosis or hydroureter. Bladder is unremarkable. Stomach/Bowel: Stomach, small bowel, and colon are mostly decompressed. Scattered stool in the colon. Sigmoid colonic diverticula without evidence of  diverticulitis. Appendiceal stump. Surgical absence of the appendix. No inflammatory wall thickening present. Vascular/Lymphatic: Aortic atherosclerosis. No enlarged abdominal or pelvic lymph nodes. Reproductive: Status post hysterectomy. No adnexal masses. Other: No abdominal wall hernia or abnormality. No abdominopelvic ascites. Musculoskeletal: Degenerative changes in the spine. Postoperative right hip arthroplasty. No destructive bone lesions. IMPRESSION: 1. Fibrosis or atelectasis in the lung bases. Diffuse bronchial wall thickening suggesting chronic bronchitis. 2. 6 mm nodule in the right lung base associated with linear scarring is more prominent than previous study and could represent inflammatory process or developing metastatic nodule. 3. Multiple hypoenhancing liver lesions, largest measuring up to about 6 cm diameter. Similar to previous study. Likely metastatic disease. 4. There is a new 3.2 cm hypodense mass in the left kidney with peripheral enhancement. This is most likely to represent a renal abscess. Renal cell carcinoma is less likely due to development of the lesion in a short interval since previous study. 5. Aortic atherosclerosis. 6. Diverticulosis of the colon without evidence of diverticulitis. Electronically Signed   By: Lucienne Capers M.D.   On: 02/14/2018 22:11       Assessment & Plan:    Principal Problem:   Renal lesion    Renal lesion 3.2cm on the left kidney IR guided drainage r/o abscess Appreciate IR input Please consult urology in am  H/o PE Hold Xarelto Heparin GTT  Hypertension Cont metoprolol  Stage 4 adenocarcinoma lung F/u with Marcial Pacas  Peripheral neuropathy Cont gabapentin 300mg  po qhs  DVT Prophylaxis Heparin -  SCDs  AM Labs Ordered, also please review Full Orders  Family Communication: Admission, patients condition and plan of care including tests being ordered have been discussed with the patient  who indicate understanding and agree  with the plan and Code Status.  Code Status FULL CODE  Likely DC to  home  Condition GUARDED    Consults called:  Please call IR in am for drainage of ? Kidney abscess left kidney  Admission status: inpatient  Time spent in minutes : 45   Jani Gravel M.D on 02/14/2018 at 10:54 PM  Between 7am to 7pm - Pager - [phone] After 7pm go to [email or web address] - password [facility]  [facility] - Office  [phone]

## 2018-02-14 NOTE — ED Notes (Signed)
EKG given to EDP,Zammitt,MD., for review.

## 2018-02-14 NOTE — ED Notes (Signed)
Bed: WA09 Expected date:  Expected time:  Means of arrival:  Comments: 84 yo F/ Weakness

## 2018-02-15 ENCOUNTER — Inpatient Hospital Stay (HOSPITAL_COMMUNITY): Payer: Medicare Other

## 2018-02-15 ENCOUNTER — Telehealth: Payer: Self-pay | Admitting: Oncology

## 2018-02-15 ENCOUNTER — Inpatient Hospital Stay
Admission: RE | Admit: 2018-02-15 | Discharge: 2018-02-15 | Disposition: A | Discharge status: Home / Self Care | Payer: Self-pay | Source: Ambulatory Visit | Attending: Radiation Oncology | Admitting: Radiation Oncology

## 2018-02-15 ENCOUNTER — Ambulatory Visit: Payer: Medicare Other | Admitting: Oncology

## 2018-02-15 ENCOUNTER — Other Ambulatory Visit: Payer: Self-pay

## 2018-02-15 ENCOUNTER — Other Ambulatory Visit: Payer: Medicare Other

## 2018-02-15 DIAGNOSIS — I700 Atherosclerosis of aorta: Secondary | ICD-10-CM

## 2018-02-15 LAB — CBC
HEMATOCRIT: 29.7 % — AB (ref 36.0–46.0)
HEMOGLOBIN: 10.1 g/dL — AB (ref 12.0–15.0)
MCH: 29.9 pg (ref 26.0–34.0)
MCHC: 34 g/dL (ref 30.0–36.0)
MCV: 87.9 fL (ref 78.0–100.0)
Platelets: 293 10*3/uL (ref 150–400)
RBC: 3.38 MIL/uL — ABNORMAL LOW (ref 3.87–5.11)
RDW: 14.5 % (ref 11.5–15.5)
WBC: 9.9 10*3/uL (ref 4.0–10.5)

## 2018-02-15 LAB — HEPARIN LEVEL (UNFRACTIONATED)
Heparin Unfractionated: 1.64 IU/mL — ABNORMAL HIGH (ref 0.30–0.70)
Heparin Unfractionated: 0.98 IU/mL — ABNORMAL HIGH (ref 0.30–0.70)

## 2018-02-15 LAB — APTT
APTT: 34 s (ref 24–36)
APTT: 120 s — AB (ref 24–36)

## 2018-02-15 LAB — COMPREHENSIVE METABOLIC PANEL
ALK PHOS: 308 U/L — AB (ref 38–126)
ALT: 25 U/L (ref 14–54)
AST: 33 U/L (ref 15–41)
Albumin: 2.5 g/dL — ABNORMAL LOW (ref 3.5–5.0)
Anion gap: 9 (ref 5–15)
BUN: 10 mg/dL (ref 6–20)
CALCIUM: 8.2 mg/dL — AB (ref 8.9–10.3)
CHLORIDE: 104 mmol/L (ref 101–111)
CO2: 21 mmol/L — AB (ref 22–32)
CREATININE: 0.9 mg/dL (ref 0.44–1.00)
GFR calc Af Amer: >60 mL/min (ref >60)
GFR calc non Af Amer: 55 mL/min — ABNORMAL LOW (ref >60)
Glucose, Bld: 103 mg/dL — ABNORMAL HIGH (ref 65–99)
Potassium: 3.8 mmol/L (ref 3.5–5.1)
SODIUM: 134 mmol/L — AB (ref 135–145)
Total Bilirubin: 1.1 mg/dL (ref 0.3–1.2)
Total Protein: 5.7 g/dL — ABNORMAL LOW (ref 6.5–8.1)

## 2018-02-15 LAB — PROTIME-INR
INR: 1.53
Prothrombin Time: 18.3 seconds — ABNORMAL HIGH (ref 11.4–15.2)

## 2018-02-15 MED ORDER — MIDAZOLAM HCL 2 MG/2ML IJ SOLN
INTRAMUSCULAR | Status: AC
  Filled 2018-02-15: qty 2

## 2018-02-15 MED ORDER — GABAPENTIN 300 MG PO CAPS
300.0000 mg | ORAL_CAPSULE | Freq: Every day | ORAL | Status: DC
  Administered 2018-02-15 – 2018-02-18 (×5): 300 mg via ORAL
  Filled 2018-02-15 (×5): qty 1

## 2018-02-15 MED ORDER — SODIUM CHLORIDE 0.9 % IV SOLN
1.0000 g | Freq: Two times a day (BID) | INTRAVENOUS | Status: DC
  Administered 2018-02-15 – 2018-02-19 (×10): 1 g via INTRAVENOUS
  Filled 2018-02-15 (×11): qty 1

## 2018-02-15 MED ORDER — FENTANYL CITRATE (PF) 100 MCG/2ML IJ SOLN
INTRAMUSCULAR | Status: AC | PRN
  Administered 2018-02-15: 50 ug via INTRAVENOUS

## 2018-02-15 MED ORDER — FENTANYL CITRATE (PF) 100 MCG/2ML IJ SOLN
INTRAMUSCULAR | Status: AC
  Filled 2018-02-15: qty 2

## 2018-02-15 MED ORDER — HEPARIN (PORCINE) IN NACL 100-0.45 UNIT/ML-% IJ SOLN
1100.0000 [IU]/h | INTRAMUSCULAR | Status: DC
  Administered 2018-02-15: 1100 [IU]/h via INTRAVENOUS
  Filled 2018-02-15: qty 250

## 2018-02-15 MED ORDER — HEPARIN BOLUS VIA INFUSION
3000.0000 [IU] | Freq: Once | INTRAVENOUS | Status: AC
  Administered 2018-02-15: 3000 [IU] via INTRAVENOUS
  Filled 2018-02-15: qty 3000

## 2018-02-15 MED ORDER — AFATINIB DIMALEATE 30 MG PO TABS: 30.0000 mg | ORAL_TABLET | Freq: Every day | ORAL | Status: DC

## 2018-02-15 MED ORDER — MIDAZOLAM HCL 2 MG/2ML IJ SOLN
INTRAMUSCULAR | Status: AC | PRN
  Administered 2018-02-15: 1 mg via INTRAVENOUS

## 2018-02-15 MED ORDER — HEPARIN (PORCINE) IN NACL 100-0.45 UNIT/ML-% IJ SOLN
950.0000 [IU]/h | INTRAMUSCULAR | Status: DC
  Administered 2018-02-15 – 2018-02-16 (×2): 950 [IU]/h via INTRAVENOUS
  Filled 2018-02-15: qty 250

## 2018-02-15 MED ORDER — LIDOCAINE HCL 1 % IJ SOLN
INTRAMUSCULAR | Status: AC | PRN
  Administered 2018-02-15: 10 mL via INTRADERMAL

## 2018-02-15 MED ORDER — METOPROLOL SUCCINATE ER 25 MG PO TB24
12.5000 mg | ORAL_TABLET | Freq: Every day | ORAL | Status: DC
  Administered 2018-02-15 – 2018-02-18 (×4): 12.5 mg via ORAL
  Filled 2018-02-15 (×5): qty 1

## 2018-02-15 MED ORDER — BOOST PLUS PO LIQD
237.0000 mL | Freq: Two times a day (BID) | ORAL | Status: DC
  Administered 2018-02-16 – 2018-02-19 (×5): 237 mL via ORAL
  Filled 2018-02-15 (×9): qty 237

## 2018-02-15 MED ORDER — LIDOCAINE HCL 1 % IJ SOLN
INTRAMUSCULAR | Status: AC
  Filled 2018-02-15: qty 20

## 2018-02-15 NOTE — Progress Notes (Signed)
MEDICATION-RELATED CONSULT NOTE   IR Procedure Consult - Anticoagulant/Antiplatelet PTA/Inpatient Med List Review by Pharmacist    Procedure: US guided aspiration left renal mass    Completed: 02/15/18 at 4:58p  Post-Procedural bleeding risk per IR MD assessment:  standard  Antithrombotic medications on inpatient or PTA profile prior to procedure:     heparin drip  Recommended restart time per IR Post-Procedure Guidelines:   Per Dr. Katrinka Blazing note resume heparin in 4 hours     Plan:      For full details see anticoagulation note from Glori Luis D from earlier today  At 2100 resume heparin drip at 950 units/hr  Check heparin level and aPTT in 8 hours  Dolly Rias RPh 02/15/2018, 5:33 PM Pager [phone]

## 2018-02-15 NOTE — Progress Notes (Addendum)
Referring Physician(s): Kim,J  Supervising Physician: Jacqulynn Cadet  Patient Status:  [facility] - In-pt  Chief Complaint:  Abdominal pain  Subjective: Patient familiar to IR service from prior liver lesion biopsies in 2018 both of which were consistent with metastatic lung adenocarcinoma.  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 She was recently admitted to [facility] with fatigue and abdominal discomfort. Subsequent imaging revealed: 1. Fibrosis or atelectasis in the lung bases. Diffuse bronchial wall thickening suggesting chronic bronchitis. 2. 6 mm nodule in the right lung base associated with linear scarring is more prominent than previous study and could represent inflammatory process or developing metastatic nodule. 3. Multiple hypoenhancing liver lesions, largest measuring up to about 6 cm diameter. Similar to previous study. Likely metastatic disease. 4. There is a new 3.2 cm hypodense mass in the left kidney with peripheral enhancement. This is most likely to represent a renal abscess. Renal cell carcinoma is less likely due to development of the lesion in a short interval since previous study. 5. Aortic atherosclerosis. 6. Diverticulosis of the colon without evidence of diverticulitis  Request now received for ultrasound-guided aspiration and possible drainage of left renal mass/abscess  Past Medical History:  Diagnosis Date  . Adenocarcinoma of left lung, stage 4 ([place]) 03/18/2017  . Arthritis    Neck,shoulder  . Brain metastases ([place]) 04/06/2017  . Colonic polyp   . Difficulty sleeping   . Encounter for antineoplastic chemotherapy 04/06/2017  . History of radiation therapy 12/04/15-12/14/15   left upper lobe 60 Gy  . Hx of unilateral nephrectomy 1994   right   . Hyperlipemia   . Lung cancer ([place])   . Numbness in feet   .  Peripheral neuropathy   . Personal history of PE (pulmonary embolism) 2011   Past Surgical History:  Procedure Laterality Date  . ABDOMINAL HYSTERECTOMY    . APPENDECTOMY    . cataracts Bilateral 09/2014   removal  . CESAREAN SECTION    . NEPHRECTOMY  1993   RT -   . TOTAL HIP ARTHROPLASTY Right 02/25/2013   Procedure: RIGHT TOTAL HIP ARTHROPLASTY ANTERIOR APPROACH;  Surgeon: Mcarthur Rossetti, MD;  Location: [facility] ORS;  Service: Orthopedics;  Laterality: Right;     Allergies: Prednisone and Penicillins  Medications: Prior to Admission medications   Medication Sig Start Date End Date Taking? Authorizing Provider  ciprofloxacin (CIPRO) 250 MG tablet Take 250 mg by mouth 2 (two) times daily.   Yes [provider]  gabapentin (NEURONTIN) 100 MG capsule Take 300 mg by mouth at bedtime.    Yes [provider]  GILOTRIF 30 MG tablet TAKE 1 TABLET (30 MG TOTAL) BY MOUTH DAILY. TAKE ON AN EMPTY STOMACH 1HR BEFORE OR 2HRS AFTER MEALS. 01/11/18  Yes Curt Bears, MD  metoprolol succinate (TOPROL-XL) 25 MG 24 hr tablet Take 0.5 tablets (12.5 mg total) by mouth daily. 11/14/17  Yes Patrecia Pour, Christean Grief, MD  rivaroxaban (XARELTO) 20 MG TABS tablet Take 20 mg by mouth daily with supper.   Yes [provider]  methylPREDNISolone (MEDROL DOSEPAK) 4 MG TBPK tablet Use as directed Patient not taking: Reported on 02/14/2018 12/28/17   Curt Bears, MD     Vital Signs: BP 116/60 (BP Location: Right Arm)   Pulse 80   Temp 98.7 F (37.1 C) (Oral)   Resp 18  Ht 5\' 6"  (1.676 m)   Wt 153 lb 7 oz (69.6 kg)   SpO2 96%   BMI 24.77 kg/m   Physical Exam awake, alert.  Chest clear to auscultation bilaterally.  Heart with regular rate and rhythm.  Abdomen soft, positive bowel sounds, some mild generalized tenderness to palpation.  No lower extremity edema  Imaging: Dg Chest 2 View  Result Date: 02/14/2018 CLINICAL DATA:  Shortness of breath. EXAM: CHEST - 2 VIEW  COMPARISON:  Chest CT 12/10/2017 FINDINGS: Borderline heart size. Negative aortic and hilar contours. Hazy nodule at the left apex that is underestimated relative to comparison CT. There is no edema, consolidation, effusion, or pneumothorax. Exaggerated thoracic kyphosis. No acute osseous finding. IMPRESSION: No evidence of active disease Electronically Signed   By: Monte Fantasia M.D.   On: 02/14/2018 21:30   Ct Abdomen Pelvis W Contrast  Result Date: 02/14/2018 CLINICAL DATA:  Acute generalized abdominal pain. Pain is getting worse. Painful urination. Being treated for urinary tract infection. EXAM: CT ABDOMEN AND PELVIS WITH CONTRAST TECHNIQUE: Multidetector CT imaging of the abdomen and pelvis was performed using the standard protocol following bolus administration of intravenous contrast. CONTRAST:  126mL ISOVUE-300 IOPAMIDOL (ISOVUE-300) INJECTION 61% COMPARISON:  12/10/2017 FINDINGS: Lower chest: Linear fibrosis or atelectasis demonstrated in the lung bases. Diffuse bronchial wall thickening suggesting chronic bronchitis. Nodule in the right lung base posteriorly associated with linear scarring and measuring about 6 mm diameter. This appears more prominent than the previous study and could be inflammatory or developing metastasis. Small esophageal hiatal hernia. Hepatobiliary: Multiple heterogeneous low-attenuation mass is demonstrated throughout the liver. The largest encompasses most of the posterior right lobe and measures up to about 6 cm in diameter. Appearances are similar to previous study and likely representing metastatic disease. Gallbladder and bile ducts are unremarkable. Pancreas: Unremarkable. No pancreatic ductal dilatation or surrounding inflammatory changes. Spleen: Calcified granuloma in the spleen.  No other focal lesions. Adrenals/Urinary Tract: No adrenal gland nodules. Surgical absence of the right kidney. No mass or fluid collection in the right renal fossa. Heterogeneous  low-attenuation mass in the midpole of the left kidney measures about 3.2 cm in diameter and has peripheral enhancement. This is new since the previous study. Given the short interval time of development, this likely represents an abscess. Renal cell carcinoma would be another consideration but less likely given the timing. No hydronephrosis or hydroureter. Bladder is unremarkable. Stomach/Bowel: Stomach, small bowel, and colon are mostly decompressed. Scattered stool in the colon. Sigmoid colonic diverticula without evidence of diverticulitis. Appendiceal stump. Surgical absence of the appendix. No inflammatory wall thickening present. Vascular/Lymphatic: Aortic atherosclerosis. No enlarged abdominal or pelvic lymph nodes. Reproductive: Status post hysterectomy. No adnexal masses. Other: No abdominal wall hernia or abnormality. No abdominopelvic ascites. Musculoskeletal: Degenerative changes in the spine. Postoperative right hip arthroplasty. No destructive bone lesions. IMPRESSION: 1. Fibrosis or atelectasis in the lung bases. Diffuse bronchial wall thickening suggesting chronic bronchitis. 2. 6 mm nodule in the right lung base associated with linear scarring is more prominent than previous study and could represent inflammatory process or developing metastatic nodule. 3. Multiple hypoenhancing liver lesions, largest measuring up to about 6 cm diameter. Similar to previous study. Likely metastatic disease. 4. There is a new 3.2 cm hypodense mass in the left kidney with peripheral enhancement. This is most likely to represent a renal abscess. Renal cell carcinoma is less likely due to development of the lesion in a short interval since previous study. 5. Aortic atherosclerosis.  6. Diverticulosis of the colon without evidence of diverticulitis. Electronically Signed   By: Lucienne Capers M.D.   On: 02/14/2018 22:11    Labs:  CBC: Recent Labs    12/10/17 1056 01/14/18 1020 02/14/18 2025 02/15/18 0604    WBC 6.3 7.2 9.2 9.9  HGB 13.3 12.6 11.8* 10.1*  HCT 39.5 38.2 33.8* 29.7*  PLT 177 204 329 293    COAGS: Recent Labs    03/03/17 1112 09/17/17 1110 02/15/18 0117  INR 1.17 1.10  --   APTT 37*  --  34    BMP: Recent Labs    12/10/17 1056 01/14/18 1020 02/14/18 2025 02/15/18 0604  NA 138 139 136 134*  K 4.7 4.5 3.8 3.8  CL 102 106 102 104  CO2 27 25 25  21*  GLUCOSE 99 111 119* 103*  BUN 16 22 12 10   CALCIUM 9.8 9.6 8.8* 8.2*  CREATININE 1.19* 1.26* 1.00 0.90  GFRNONAA 40* 37* 49* 55*  GFRAA 46* 43* 57* >60    LIVER FUNCTION TESTS: Recent Labs    12/10/17 1056 01/14/18 1020 02/14/18 2025 02/15/18 0604  BILITOT 0.9 0.7 0.9 1.1  AST 48* 41* 37 33  ALT 44 37 31 25  ALKPHOS 422* 393* 387* 308*  PROT 7.9 7.3 7.2 5.7*  ALBUMIN 3.7 3.3* 3.0* 2.5*    Assessment and Plan: Pt with known history of 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 She was recently admitted to [facility] with fatigue and abdominal discomfort. Subsequent imaging revealed: 1. Fibrosis or atelectasis in the lung bases. Diffuse bronchial wall thickening suggesting chronic bronchitis. 2. 6 mm nodule in the right lung base associated with linear scarring is more prominent than previous study and could represent inflammatory process or developing metastatic nodule. 3. Multiple hypoenhancing liver lesions, largest measuring up to about 6 cm diameter. Similar to previous study. Likely metastatic disease. 4. There is a new 3.2 cm hypodense mass in the left kidney with peripheral enhancement. This is most likely to represent a renal abscess. Renal cell carcinoma is less likely due to development of the lesion in a short interval since previous study. 5. Aortic atherosclerosis. 6. Diverticulosis of the colon without evidence of diverticulitis  Request now received for ultrasound-guided  aspiration and possible drainage of left renal mass/abscess; imaging studies have been reviewed by Dr. Laurence Ferrari.Risks and benefits discussed with the patient including, but not limited to bleeding, infection, damage to adjacent structures or low yield requiring additional tests.  All of the patient's questions were answered, patient is agreeable to proceed. Consent signed and in chart.  Patient currently on IV heparin drip which will need to be discontinued prior to the procedure.  Last dose of Xarelto 4/27; case tentatively scheduled for later this afternoon.   Electronically Signed: D. Rowe Robert, PA-C 02/15/2018, 9:38 AM   I spent a total of 25 minutes at the the patient's bedside AND on the patient's hospital floor or unit, greater than 50% of which was counseling/coordinating care for ultrasound-guided aspiration/possible drainage of left renal mass/abscess    Patient ID: [name], female   DOB: [birth year], 84 y.o.   MRN: [id number]

## 2018-02-15 NOTE — Progress Notes (Signed)
Initial Nutrition Assessment  DOCUMENTATION CODES:   Not applicable  INTERVENTION:    Monitor for diet advancement/toleration  Boost Plus chocolate BID- Each supplement provides 360kcal and 14g protein.    NUTRITION DIAGNOSIS:   Increased nutrient needs related to cancer and cancer related treatments as evidenced by estimated needs.  GOAL:   Patient will meet greater than or equal to 90% of their needs  MONITOR:   PO intake, Supplement acceptance, Weight trends, Labs  REASON FOR ASSESSMENT:   Malnutrition Screening Tool    ASSESSMENT:   Patient with PMH significant for stage 4 adenocarcinoma left lung, PE, HLD, peripheral neuropathy, and  unilateral nephrectomy. Presents this admission with complaints of fatigue and left sided abdominal discomfort. Found to have renal lesion on left kidney.    Spoke with pt at bedside. Reports having decreased PO intake for two weeks PTA due to fatigue. States during this time period she would eat oatmeal with fruit for breakfast and chicken with a vegetable for dinner. If pt was too tired to eat dinner she would mix boost with ice cream to use as a meal replacement.Discussed the importance of protein intake for preservation of lean body mass. Suggested that pt increase Boost to 2-3 times per day if poor appetite persist post discharge. Pt is currently NPO for aspiration and drianage of left kidney abscess.   Pt endorses a UBW of 165 lb, the last time being at that weight the beginning of the year. Records indicate pt weighed 163 1/21/189 and 153 lb this admission (6.1% in four moths, insignificant for time frame). Nutrition-Focused physical exam completed.   Medications reviewed and include: IV abx Labs reviewed: Na 134 (L)   NUTRITION - FOCUSED PHYSICAL EXAM:    Most Recent Value  Orbital Region  No depletion  Upper Arm Region  No depletion  Thoracic and Lumbar Region  Unable to assess  Buccal Region  No depletion  Temple Region  No  depletion  Clavicle Bone Region  Moderate depletion  Clavicle and Acromion Bone Region  Mild depletion  Scapular Bone Region  Unable to assess  Dorsal Hand  Mild depletion  Patellar Region  No depletion  Anterior Thigh Region  Mild depletion  Posterior Calf Region  Mild depletion  Edema (RD Assessment)  None  Hair  Reviewed  Eyes  Reviewed  Mouth  Reviewed  Skin  Reviewed  Nails  Reviewed     Diet Order:  Diet NPO time specified Except for: Sips with Meds  EDUCATION NEEDS:   Education needs have been addressed  Skin:  Skin Assessment: Reviewed RN Assessment  Last BM:  02/13/18  Height:   Ht Readings from Last 1 Encounters:  02/15/18 5\' 6"  (1.676 m)    Weight:   Wt Readings from Last 1 Encounters:  02/15/18 153 lb 7 oz (69.6 kg)    Ideal Body Weight:  59.1 kg  BMI:  Body mass index is 24.77 kg/m.  Estimated Nutritional Needs:   Kcal:  1750-1950 kcal  Protein:  85-95 g  Fluid:  >1.7 L/day    Mariana Single RD, LDN Clinical Nutrition Pager # - [phone]

## 2018-02-15 NOTE — Progress Notes (Signed)
Not sure yet if renal lesion is abscess or other cause, pharmacy wishes to hold Gilotrif. That is fine. Daytime physician to please contact oncology in AM to see what their opinion is.

## 2018-02-15 NOTE — Progress Notes (Signed)
  PROGRESS NOTE  [name] KGY:[id number] DOB: 1927-12-10 DOA: 02/14/2018 PCP: Derinda Late, MD  Brief Narrative: 84 year old woman PMH adenocarcinoma left lung stage IV, unilateral nephrectomy 1994, presented with fatigue, left-sided abdominal discomfort.  Seen as an outpatient and treated for UTI with ciprofloxacin with no clinical improvement.  CT scan revealed new 3 cm left kidney lesion concerning for renal abscess.  Admitted for further evaluation, consideration of IR percutaneous drainage per recommendation of urology.  Assessment/Plan Left renal lesion concerning for abscess --Interventional radiology plans procedure today, aspiration versus biopsy versus drain placement.  PMH Pulmonary embolism 2011 --Xarelto on hold pending percutaneous drainage --Resume anticoagulation when okay per interventional radiology  Stage IV adenocarcinoma of left lung, metastatic disease suspected right lung base, liver --Follow-up as an outpatient  Peripheral neuropathy, continue gabapentin.  Aortic atherosclerosis   DVT prophylaxis: resume Xarelto when ok with IR Code Status: full Family Communication: none Disposition Plan: home    Murray Hodgkins, MD  [facility] Direct contact: [phone] --[place]ia [place]  --[email or web address]; password [email or web address]  7PM-7AM contact night coverage as above 02/15/2018, 4:38 PM  LOS: 1 day   Consultants:  Interventional radiology  Procedures:    Antimicrobials:  Aztreonam  Meropenem 4/28 >>  Interval history/Subjective: Feels well, no pain.  Objective: Vitals:  Vitals:   02/15/18 1630 02/15/18 1635  BP: 134/64 (!) 142/77  Pulse: 85 95  Resp: (!) 23 (!) 24  Temp:    SpO2: 100% 100%    Exam:  Constitutional:  . Appears calm and comfortable Respiratory:  . CTA bilaterally, no w/r/r.  . Respiratory effort normal.  Cardiovascular:  . RRR, no m/r/g . No LE extremity edema   Psychiatric:  . Mental status o Mood, affect  appropriate  I have personally reviewed the following:   Labs:  Basic metabolic panel unremarkable  Alkaline phosphatase 308, no significant change, LFTs otherwise unremarkable  WBC 9.9, hemoglobin 10.1   Scheduled Meds: . feeding supplement (PRO-STAT SUGAR FREE 64)  30 mL Oral BID  . fentaNYL      . gabapentin  300 mg Oral QHS  . lactose free nutrition  237 mL Oral BID BM  . lidocaine      . metoprolol succinate  12.5 mg Oral Daily  . midazolam       Continuous Infusions: . meropenem (MERREM) IV Stopped (02/15/18 1012)    Principal Problem:   Renal lesion Active Problems:   Adenocarcinoma of left lung, stage 4 ([facility])   Aortic atherosclerosis ([place])   LOS: 1 day

## 2018-02-15 NOTE — Progress Notes (Signed)
Patient went down for IR procedure.

## 2018-02-15 NOTE — Telephone Encounter (Signed)
R/s appt per 4/29 sch message - left message with appt date and time.

## 2018-02-15 NOTE — Progress Notes (Signed)
Pharmacy Antibiotic Note  [name] is a 84 y.o. female admitted on 02/14/2018 with renal abscess.  Pharmacy has been consulted for meropenem dosing.  She got aztreonam 1 gm x 1 dose in ED. Per care everywhere her PCN allergy is rash- no record of previous cephalosporins in Epic.  Pt on cipro PTA.   Plan: Meropenem 1 gm IV q12 F/u renal fxn, wbc, temp, culture data  Height: 5\' 9"  (175.3 cm) Weight: 155 lb (70.3 kg) IBW/kg (Calculated) : 66.2  Temp (24hrs), Avg:98.4 F (36.9 C), Min:98.3 F (36.8 C), Max:98.5 F (36.9 C)  Recent Labs  Lab 02/14/18 2025 02/14/18 2032  WBC 9.2  --   CREATININE 1.00  --   LATICACIDVEN  --  1.05    Estimated Creatinine Clearance: 40.6 mL/min (by C-G formula based on SCr of 1 mg/dL).    Allergies  Allergen Reactions  . Prednisone   . Penicillins Rash    Has patient had a PCN reaction causing immediate rash, facial/tongue/throat swelling, SOB or lightheadedness with hypotension: Unknown Has patient had a PCN reaction causing severe rash involving mucus membranes or skin necrosis: Unknown Has patient had a PCN reaction that required hospitalization: Unknown Has patient had a PCN reaction occurring within the last 10 years: Unknown If all of the above answers are "NO", then may proceed with Cephalosporin use.     Antimicrobials this admission: 4/28 aztreonam x 1 4/29 meropenem>>  Dose adjustments this admission:  Microbiology results: 4/28 Bcx2>> 4/28 Ucx>>  Thank you for allowing pharmacy to be a part of this patient's care.  Eudelia Bunch, Pharm.D. [phone] 02/15/2018 12:44 AM

## 2018-02-15 NOTE — Progress Notes (Signed)
ANTICOAGULATION CONSULT NOTE - Initial Consult  Pharmacy Consult for heparin bridge therapy Indication: pulmonary embolus- hx on xarelto 20 qday PTA  Allergies  Allergen Reactions  . Prednisone   . Penicillins Rash    Has patient had a PCN reaction causing immediate rash, facial/tongue/throat swelling, SOB or lightheadedness with hypotension: Unknown Has patient had a PCN reaction causing severe rash involving mucus membranes or skin necrosis: Unknown Has patient had a PCN reaction that required hospitalization: Unknown Has patient had a PCN reaction occurring within the last 10 years: Unknown If all of the above answers are "NO", then may proceed with Cephalosporin use.     Patient Measurements: Height: 5\' 9"  (175.3 cm) Weight: 155 lb (70.3 kg) IBW/kg (Calculated) : 66.2 Heparin Dosing Weight: 70.3  Vital Signs: Temp: 98.3 F (36.8 C) (04/28 1948) Temp Source: Oral (04/28 1948) BP: 157/74 (04/28 2300) Pulse Rate: 95 (04/28 2300)  Labs: Recent Labs    02/14/18 2025  HGB 11.8*  HCT 33.8*  PLT 329  CREATININE 1.00    Estimated Creatinine Clearance: 40.6 mL/min (by C-G formula based on SCr of 1 mg/dL).   Medical History: Past Medical History:  Diagnosis Date  . Adenocarcinoma of left lung, stage 4 ([place]) 03/18/2017  . Arthritis    Neck,shoulder  . Brain metastases ([place]) 04/06/2017  . Colonic polyp   . Difficulty sleeping   . Encounter for antineoplastic chemotherapy 04/06/2017  . History of radiation therapy 12/04/15-12/14/15   left upper lobe 60 Gy  . Hx of unilateral nephrectomy 1994   right   . Hyperlipemia   . Lung cancer ([place])   . Numbness in feet   . Peripheral neuropathy   . Personal history of PE (pulmonary embolism) 2011    Assessment: 84 yo F on xarelto PTA for hx PE.  Pharmacy consulted to dose heparin bridge therapy.  IR consulted to drain L kidney abscess.  PTA xarelto 20 qsupper last dose 4/27 at 1830.  Hg 11.8, pltc WNL, SCr WNL.   Goal of  Therapy:  Heparin level 0.3-0.7 units/ml aPTT 66-102 seconds Monitor platelets by anticoagulation protocol: Yes   Plan:  Drawn baseline aPTT and heparin level before starting heparin After baseline labs are drawn give heparin 3000 unit bolus and drip at 1100 units/hr and check 8 hr aPTT/HL Daily CBC and aPTT/HL   Eudelia Bunch, Pharm.D. [phone] 02/15/2018 12:33 AM

## 2018-02-15 NOTE — Progress Notes (Addendum)
Afatinib (Gilotrif) Hold Criteria: . SCr > 1.5x baseline (or > 2 if baseline unknown) . AST or ALT > 3x ULN . Bili > 1.5x ULN . Bullous or exfoliative skin eruption . Unexplained pneumonitis/hypoxemia . Diarrhea ? Grade 2 . Ulcerative keratitis . Active Infection  Gilotrif on hold for active infection.  Eudelia Bunch, Pharm.D. [phone] 02/15/2018 1:06 AM

## 2018-02-15 NOTE — Procedures (Signed)
Interventional Radiology Procedure Note  Procedure: US guided aspiration left renal mass yielding 1 mL purulent material.  Sample sent for culture  Complications: None  Estimated Blood Loss: None  Recommendations: - Return to room - Bedrest x 4 hrs - Resume heparin in 4 hrs  Signed,  Criselda Peaches, MD

## 2018-02-15 NOTE — Progress Notes (Signed)
ANTICOAGULATION CONSULT NOTE - Follow Up Consult  Pharmacy Consult for  heparin Indication: hx pulmonary embolus; home xarelto on hold  Allergies  Allergen Reactions  . Prednisone   . Penicillins Rash    Has patient had a PCN reaction causing immediate rash, facial/tongue/throat swelling, SOB or lightheadedness with hypotension: Unknown Has patient had a PCN reaction causing severe rash involving mucus membranes or skin necrosis: Unknown Has patient had a PCN reaction that required hospitalization: Unknown Has patient had a PCN reaction occurring within the last 10 years: Unknown If all of the above answers are "NO", then may proceed with Cephalosporin use.     Patient Measurements: Height: 5\' 6"  (167.6 cm) Weight: 153 lb 7 oz (69.6 kg) IBW/kg (Calculated) : 59.3 Heparin Dosing Weight: 70 kg  Vital Signs: Temp: 98.7 F (37.1 C) (04/29 0635) Temp Source: Oral (04/29 0635) BP: 116/60 (04/29 0635) Pulse Rate: 80 (04/29 0635)  Labs: Recent Labs    02/14/18 2025 02/15/18 0117 02/15/18 0604  HGB 11.8*  --  10.1*  HCT 33.8*  --  29.7*  PLT 329  --  293  APTT  --  34  --   HEPARINUNFRC  --  1.64*  --   CREATININE 1.00  --  0.90    Estimated Creatinine Clearance: 40.4 mL/min (by C-G formula based on SCr of 0.9 mg/dL).   Medications:  PTA: xarelto 20 mg daily (Last dose on 4/27 at 1830)  Assessment: Patient is an 84 y.o F with hx lung cancer and PE on xarelto 20 mg daily PTA. She presented to the ED on 4/28 with c/o weakness and was found to have a new hypodense mass in left kidney with suspicion for abscess.  PTA xarelto placed on hold and she was started on heparin drip in case surgical intervention is needed.  Today, 02/15/2018: - heparin and aPTT levels are elevated at 0.98 and 120, respectively, with drip running at 1100 units/hr - cbc relatively stable - heparin drip turned off ~0930 for IR intervention later today (aspiration vs bx vs drain placement of left renal  abscess)  Goal of Therapy:  Heparin level 0.3-0.7 units/ml aPTT 66-102 seconds Monitor platelets by anticoagulation protocol: Yes   Plan:  - heparin placed on hold at this time in anticipation for IR procedure later today - please advise [place] if/when anticoagulation can be resumed back for pt after procedure   Shameka Aggarwal P 02/15/2018,9:38 AM

## 2018-02-16 DIAGNOSIS — I700 Atherosclerosis of aorta: Secondary | ICD-10-CM

## 2018-02-16 LAB — CBC
HCT: 30.4 % — ABNORMAL LOW (ref 36.0–46.0)
Hemoglobin: 10.5 g/dL — ABNORMAL LOW (ref 12.0–15.0)
MCH: 30.3 pg (ref 26.0–34.0)
MCHC: 34.5 g/dL (ref 30.0–36.0)
MCV: 87.9 fL (ref 78.0–100.0)
PLATELETS: 302 10*3/uL (ref 150–400)
RBC: 3.46 MIL/uL — AB (ref 3.87–5.11)
RDW: 14.6 % (ref 11.5–15.5)
WBC: 12 10*3/uL — AB (ref 4.0–10.5)

## 2018-02-16 LAB — URINE CULTURE: Culture: NO GROWTH

## 2018-02-16 LAB — APTT: aPTT: 92 seconds — ABNORMAL HIGH (ref 24–36)

## 2018-02-16 LAB — HEPARIN LEVEL (UNFRACTIONATED)
HEPARIN UNFRACTIONATED: 0.53 [IU]/mL (ref 0.30–0.70)
HEPARIN UNFRACTIONATED: 0.23 [IU]/mL — AB (ref 0.30–0.70)

## 2018-02-16 MED ORDER — HEPARIN (PORCINE) IN NACL 100-0.45 UNIT/ML-% IJ SOLN
1100.0000 [IU]/h | INTRAMUSCULAR | Status: AC
  Filled 2018-02-16: qty 250

## 2018-02-16 MED ORDER — TRAZODONE HCL 50 MG PO TABS
50.0000 mg | ORAL_TABLET | Freq: Once | ORAL | Status: AC
  Administered 2018-02-16: 50 mg via ORAL
  Filled 2018-02-16: qty 1

## 2018-02-16 MED ORDER — ONDANSETRON HCL 4 MG/2ML IJ SOLN
4.0000 mg | Freq: Four times a day (QID) | INTRAMUSCULAR | Status: DC | PRN
  Administered 2018-02-16: 4 mg via INTRAVENOUS
  Filled 2018-02-16: qty 2

## 2018-02-16 NOTE — Progress Notes (Signed)
ANTICOAGULATION CONSULT NOTE - Follow Up Consult  Pharmacy Consult for  heparin Indication: hx pulmonary embolus; home xarelto on hold  Allergies  Allergen Reactions  . Prednisone   . Penicillins Rash    Has patient had a PCN reaction causing immediate rash, facial/tongue/throat swelling, SOB or lightheadedness with hypotension: Unknown Has patient had a PCN reaction causing severe rash involving mucus membranes or skin necrosis: Unknown Has patient had a PCN reaction that required hospitalization: Unknown Has patient had a PCN reaction occurring within the last 10 years: Unknown If all of the above answers are "NO", then may proceed with Cephalosporin use.     Patient Measurements: Height: 5\' 6"  (167.6 cm) Weight: 153 lb 7 oz (69.6 kg) IBW/kg (Calculated) : 59.3 Heparin Dosing Weight: 70 kg  Vital Signs: Temp: 98.2 F (36.8 C) (04/30 1424) Temp Source: Oral (04/30 1424) BP: 135/58 (04/30 1424) Pulse Rate: 91 (04/30 1424)  Labs: Recent Labs    02/14/18 2025  02/15/18 0117 02/15/18 0604 02/15/18 0936 02/16/18 0716 02/16/18 1506  HGB 11.8*  --   --  10.1*  --  10.5*  --   HCT 33.8*  --   --  29.7*  --  30.4*  --   PLT 329  --   --  293  --  302  --   APTT  --   --  34  --  120* 92*  --   LABPROT  --   --   --   --  18.3*  --   --   INR  --   --   --   --  1.53  --   --   HEPARINUNFRC  --    < > 1.64*  --  0.98* 0.53 0.23*  CREATININE 1.00  --   --  0.90  --   --   --    < > = values in this interval not displayed.    Estimated Creatinine Clearance: 40.4 mL/min (by C-G formula based on SCr of 0.9 mg/dL).   Medications:  PTA: xarelto 20 mg daily (Last dose on 4/27 at 1830)  Assessment: Patient is an 84 y.o F with hx lung cancer and PE on xarelto 20 mg daily PTA. She presented to the ED on 4/28 with c/o weakness and was found to have a new hypodense mass in left kidney with suspicion for abscess.  PTA xarelto placed on hold and she was started on heparin drip in  case surgical intervention is needed.  - 4/29: aspiration of renal mass with heparin drip resumed 4 hrs post procedure  Today, 02/16/2018: - heparin and aPTT are therapeutic at 0.53 and 92, respectively, with drip running at 950 units/hr - cbc relatively stable - no bleeding documented  2nd shift update: - heparin level = 0.23 with heparin infusing @ 950 units/hr (previous heparin level was therapeutic) - spoke with RN and no disruption in therapy - no signs/symptoms of bleeding noted  Goal of Therapy:  Heparin level 0.3-0.7 units/ml aPTT 66-102 seconds Monitor platelets by anticoagulation protocol: Yes   Plan:  -increase heparin to 1100 units/hr - will check heparin level 8 hr after rate increase - follow CBC daily - monitor for s/s bleeding  Contina Strain, Toribio Harbour, PharmD 02/16/2018,5:15 PM

## 2018-02-16 NOTE — Progress Notes (Signed)
[facility]                                                                              Patient Demographics  [name], is a 84 y.o. female, DOB - 11-20-1927, RJJ:[id number]  Admit date - 02/14/2018   Admitting Physician Jani Gravel, MD  Outpatient Primary MD for the patient is Derinda Late, MD  Outpatient specialists:   LOS - 2  days   Medical records reviewed and are as summarized below:    Chief Complaint  Patient presents with  . Weakness       Brief summary   84 year old woman PMH adenocarcinoma left lung stage IV, unilateral nephrectomy 1994, presented with fatigue, left-sided abdominal discomfort.  Seen as an outpatient and treated for UTI with ciprofloxacin with no clinical improvement.  CT scan revealed new 3 cm left kidney lesion concerning for renal abscess.  Admitted for further evaluation, consideration of IR percutaneous drainage per recommendation of urology.     Assessment & Plan    Principal Problem: Left renal lesion concerning for abscess  -CT abdomen pelvis showed new 3.2 cm hypodense mass in the left kidney with peripheral enhancement, likely renal abscess.  6 mm nodule in the right lung base could represent inflammatory process or developing metastatic nodule. -Interventional radiology was consulted, patient underwent ultrasound-guided aspiration of the left renal mass yielded 1 mL of purulent material, follow cultures -Blood cultures negative to date, urine culture negative   Active Problems: Metastatic adenocarcinoma of left lung, stage 4 ([place]) -Follow outpatient with oncology    Aortic atherosclerosis ([place]) -No chest pain   Pulmonary embolism in 2011 -Currently on heparin drip, restart Xarelto once cleared by IR   Code Status: full  DVT Prophylaxis:  xarelto Family Communication: Discussed in detail with the patient, all imaging results, lab results explained to the patient  Disposition Plan:   Time Spent in  minutes   35 minutes  Procedures:   Consultants:     Antimicrobials:      Medications  Scheduled Meds: . feeding supplement (PRO-STAT SUGAR FREE 64)  30 mL Oral BID  . gabapentin  300 mg Oral QHS  . lactose free nutrition  237 mL Oral BID BM  . metoprolol succinate  12.5 mg Oral Daily   Continuous Infusions: . heparin 950 Units/hr (02/16/18 0738)  . meropenem (MERREM) IV Stopped (02/16/18 1009)   PRN Meds:.acetaminophen **OR** acetaminophen   Antibiotics   Anti-infectives (From admission, onward)   Start     Dose/Rate Route Frequency Ordered Stop   02/15/18 0030  meropenem (MERREM) 1 g in sodium chloride 0.9 % 100 mL IVPB     1 g 200 mL/hr over 30 Minutes Intravenous Every 12 hours 02/15/18 0020     02/14/18 2300  aztreonam (AZACTAM) 1 g in sodium chloride 0.9 % 100 mL IVPB     1 g 200 mL/hr over 30 Minutes Intravenous  Once 02/14/18 2252 02/14/18 2350   02/14/18 2245  aztreonam (AZACTAM) 1 g in sodium chloride 0.9 % 100 mL IVPB  Status:  Discontinued     1 g 200 mL/hr over 30 Minutes  Intravenous  Once 02/14/18 2244 02/14/18 2252        Subjective:   [name] was seen and examined today.  No complaints today, no fevers or chills.  Abdominal pain improving. Patient denies dizziness, chest pain, shortness of breath,  N/V/D/C, new weakness, numbess, tingling. No acute events overnight.    Objective:   Vitals:   02/15/18 1645 02/15/18 1647 02/15/18 2031 02/16/18 0537  BP: 132/71 132/71 (!) 108/54 (!) 120/58  Pulse: 89 92 84 83  Resp: 18 19 18 16   Temp:   98.2 F (36.8 C) 98.3 F (36.8 C)  TempSrc:   Oral Oral  SpO2: 100% 99% 98% 97%  Weight:      Height:        Intake/Output Summary (Last 24 hours) at 02/16/2018 1207 Last data filed at 02/16/2018 0700 Gross per 24 hour  Intake 183.65 ml  Output 1 ml  Net 182.65 ml     Wt Readings from Last 3 Encounters:  02/15/18 69.6 kg (153 lb 7 oz)  01/14/18 70.7 kg (155 lb 14.4 oz)  12/25/17 72.1 kg (159  lb)     Exam  General: Alert and oriented x 3, NAD  Eyes:   HEENT:  Cardiovascular: S1 S2 auscultated, Regular rate and rhythm.  Respiratory: Clear to auscultation bilaterally, no wheezing, rales or rhonchi  Gastrointestinal: Soft, nontender, nondistended, + bowel sounds  Ext: no pedal edema bilaterally  Neuro: no new deficits  Musculoskeletal: No digital cyanosis, clubbing  Skin: No rashes  Psych: Normal affect and demeanor, alert and oriented x3    Data Reviewed:  I have personally reviewed following labs and imaging studies  Micro Results Recent Results (from the past 240 hour(s))  Urine culture     Status: None   Collection Time: 02/14/18  7:51 PM  Result Value Ref Range Status   Specimen Description   Final    URINE, CLEAN CATCH Performed at [facility] [address], [place], [place] [address]    Special Requests   Final    NONE Performed at [facility], [place] [address], [place], [place] [address]    Culture   Final    NO GROWTH Performed at [facility], [place] [address] [place], [place] [address]    Report Status 02/16/2018 FINAL  Final  Culture, blood (routine x 2)     Status: None (Preliminary result)   Collection Time: 02/14/18 11:29 PM  Result Value Ref Range Status   Specimen Description   Final    BLOOD RIGHT HAND Performed at [facility] [address], [place], [place] [address]    Special Requests   Final    BOTTLES DRAWN AEROBIC ONLY Blood Culture results may not be optimal due to an inadequate volume of blood received in culture bottles Performed at [facility] [address], [place], [place] [address]    Culture   Final    NO GROWTH 1 DAY Performed at [facility], [place] [address] [place], [place] [address]    Report Status PENDING  Incomplete  Culture, blood (routine x 2)     Status: None (Preliminary result)   Collection Time: 02/15/18   1:17 AM  Result Value Ref Range Status   Specimen Description   Final    BLOOD RIGHT HAND Performed at [place] [address] [place],  [address]    Special Requests   Final  BOTTLES DRAWN AEROBIC ONLY Blood Culture adequate volume Performed at [facility] [address], [place], [place] [address]    Culture   Final    NO GROWTH 1 DAY Performed at [facility], [place] [address] [place], [place] [address]    Report Status PENDING  Incomplete  Aerobic/Anaerobic Culture (surgical/deep wound)     Status: None (Preliminary result)   Collection Time: 02/15/18  4:59 PM  Result Value Ref Range Status   Specimen Description   Final    NONE Performed at [facility] [address] [place], [place] [address]    Special Requests   Final    RENAL Performed at [facility] [address] [place], [place] [address]    Gram Stain   Final    ABUNDANT WBC PRESENT, PREDOMINANTLY PMN NO SQUAMOUS EPITHELIAL CELLS SEEN NO ORGANISMS SEEN    Culture   Final    CULTURE REINCUBATED FOR BETTER GROWTH Performed at [facility], [place] [address], [place], [place] [address]    Report Status PENDING  Incomplete    Radiology Reports Dg Chest 2 View  Result Date: 02/14/2018 CLINICAL DATA:  Shortness of breath. EXAM: CHEST - 2 VIEW COMPARISON:  Chest CT 12/10/2017 FINDINGS: Borderline heart size. Negative aortic and hilar contours. Hazy nodule at the left apex that is underestimated relative to comparison CT. There is no edema, consolidation, effusion, or pneumothorax. Exaggerated thoracic kyphosis. No acute osseous finding. IMPRESSION: No evidence of active disease Electronically Signed   By: Monte Fantasia M.D.   On: 02/14/2018 21:30   US Guided Needle Placement  Result Date: 02/15/2018 INDICATION: 84 year old female with a history of stage IV left lung adenocarcinoma and biopsy-proven liver  metastasis. She has a new hypoechoic lesion versus fluid collection in the left kidney concerning for renal abscess, or less likely metastatic disease. She presents for ultrasound-guided aspiration versus biopsy versus drain placement. EXAM: Ultrasound-guided aspiration MEDICATIONS: The patient is currently admitted to the hospital and receiving intravenous antibiotics. The antibiotics were administered within an appropriate time frame prior to the initiation of the procedure. ANESTHESIA/SEDATION: Fentanyl 50 mcg IV; Versed 1 mg IV Moderate Sedation Time:  17 minutes The patient was continuously monitored during the procedure by the interventional radiology nurse under my direct supervision. COMPLICATIONS: None immediate. PROCEDURE: Informed written consent was obtained from the patient after a thorough discussion of the procedural risks, benefits and alternatives. All questions were addressed. Maximal Sterile Barrier Technique was utilized including caps, mask, sterile gowns, sterile gloves, sterile drape, hand hygiene and skin antiseptic. A timeout was performed prior to the initiation of the procedure. The left flank was interrogated with ultrasound. The kidney is extremely difficult to visualize. Several different patient positions were tried. Ultimately, a region of relative hypoechogenicity within the left interpolar kidney was successfully identified. The overlying skin was marked. Local anesthesia was attained by infiltration with 1% lidocaine. A small dermatotomy was made. Under real-time sonographic guidance, an 18 gauge trocar needle was advanced into the fluid collection. Aspiration was performed yielding approximately 1 mL of purulence bloody fluid. This was sent for Gram stain and culture. There appears to be insufficient liquid component to facilitate placement of a percutaneous drainage catheter. The trocar needle was removed. A Band-Aid was applied. IMPRESSION: Ultrasound-guided aspiration of vague  hypoechoic region in the left interpolar kidney yields approximately 1 mL of bloody purulent fluid. Findings are most suggestive of renal abscess, however this  is not well organized and there is insufficient fluid component to allow for percutaneous drain placement at this time. Electronically Signed   By: Jacqulynn Cadet M.D.   On: 02/15/2018 17:14   Ct Abdomen Pelvis W Contrast  Result Date: 02/14/2018 CLINICAL DATA:  Acute generalized abdominal pain. Pain is getting worse. Painful urination. Being treated for urinary tract infection. EXAM: CT ABDOMEN AND PELVIS WITH CONTRAST TECHNIQUE: Multidetector CT imaging of the abdomen and pelvis was performed using the standard protocol following bolus administration of intravenous contrast. CONTRAST:  153mL ISOVUE-300 IOPAMIDOL (ISOVUE-300) INJECTION 61% COMPARISON:  12/10/2017 FINDINGS: Lower chest: Linear fibrosis or atelectasis demonstrated in the lung bases. Diffuse bronchial wall thickening suggesting chronic bronchitis. Nodule in the right lung base posteriorly associated with linear scarring and measuring about 6 mm diameter. This appears more prominent than the previous study and could be inflammatory or developing metastasis. Small esophageal hiatal hernia. Hepatobiliary: Multiple heterogeneous low-attenuation mass is demonstrated throughout the liver. The largest encompasses most of the posterior right lobe and measures up to about 6 cm in diameter. Appearances are similar to previous study and likely representing metastatic disease. Gallbladder and bile ducts are unremarkable. Pancreas: Unremarkable. No pancreatic ductal dilatation or surrounding inflammatory changes. Spleen: Calcified granuloma in the spleen.  No other focal lesions. Adrenals/Urinary Tract: No adrenal gland nodules. Surgical absence of the right kidney. No mass or fluid collection in the right renal fossa. Heterogeneous low-attenuation mass in the midpole of the left kidney measures  about 3.2 cm in diameter and has peripheral enhancement. This is new since the previous study. Given the short interval time of development, this likely represents an abscess. Renal cell carcinoma would be another consideration but less likely given the timing. No hydronephrosis or hydroureter. Bladder is unremarkable. Stomach/Bowel: Stomach, small bowel, and colon are mostly decompressed. Scattered stool in the colon. Sigmoid colonic diverticula without evidence of diverticulitis. Appendiceal stump. Surgical absence of the appendix. No inflammatory wall thickening present. Vascular/Lymphatic: Aortic atherosclerosis. No enlarged abdominal or pelvic lymph nodes. Reproductive: Status post hysterectomy. No adnexal masses. Other: No abdominal wall hernia or abnormality. No abdominopelvic ascites. Musculoskeletal: Degenerative changes in the spine. Postoperative right hip arthroplasty. No destructive bone lesions. IMPRESSION: 1. Fibrosis or atelectasis in the lung bases. Diffuse bronchial wall thickening suggesting chronic bronchitis. 2. 6 mm nodule in the right lung base associated with linear scarring is more prominent than previous study and could represent inflammatory process or developing metastatic nodule. 3. Multiple hypoenhancing liver lesions, largest measuring up to about 6 cm diameter. Similar to previous study. Likely metastatic disease. 4. There is a new 3.2 cm hypodense mass in the left kidney with peripheral enhancement. This is most likely to represent a renal abscess. Renal cell carcinoma is less likely due to development of the lesion in a short interval since previous study. 5. Aortic atherosclerosis. 6. Diverticulosis of the colon without evidence of diverticulitis. Electronically Signed   By: Lucienne Capers M.D.   On: 02/14/2018 22:11    Lab Data:  CBC: Recent Labs  Lab 02/14/18 2025 02/15/18 0604 02/16/18 0716  WBC 9.2 9.9 12.0*  NEUTROABS 6.3  --   --   HGB 11.8* 10.1* 10.5*  HCT  33.8* 29.7* 30.4*  MCV 87.8 87.9 87.9  PLT 329 293 144   Basic Metabolic Panel: Recent Labs  Lab 02/14/18 2025 02/15/18 0604  NA 136 134*  K 3.8 3.8  CL 102 104  CO2 25 21*  GLUCOSE 119* 103*  BUN  12 10  CREATININE 1.00 0.90  CALCIUM 8.8* 8.2*   GFR: Estimated Creatinine Clearance: 40.4 mL/min (by C-G formula based on SCr of 0.9 mg/dL). Liver Function Tests: Recent Labs  Lab 02/14/18 2025 02/15/18 0604  AST 37 33  ALT 31 25  ALKPHOS 387* 308*  BILITOT 0.9 1.1  PROT 7.2 5.7*  ALBUMIN 3.0* 2.5*   Recent Labs  Lab 02/14/18 2025  LIPASE 38   No results for input(s): AMMONIA in the last 168 hours. Coagulation Profile: Recent Labs  Lab 02/15/18 0936  INR 1.53   Cardiac Enzymes: No results for input(s): CKTOTAL, CKMB, CKMBINDEX, TROPONINI in the last 168 hours. BNP (last 3 results) No results for input(s): PROBNP in the last 8760 hours. HbA1C: No results for input(s): HGBA1C in the last 72 hours. CBG: No results for input(s): GLUCAP in the last 168 hours. Lipid Profile: No results for input(s): CHOL, HDL, LDLCALC, TRIG, CHOLHDL, LDLDIRECT in the last 72 hours. Thyroid Function Tests: No results for input(s): TSH, T4TOTAL, FREET4, T3FREE, THYROIDAB in the last 72 hours. Anemia Panel: No results for input(s): VITAMINB12, FOLATE, FERRITIN, TIBC, IRON, RETICCTPCT in the last 72 hours. Urine analysis:    Component Value Date/Time   COLORURINE YELLOW 02/14/2018 1951   APPEARANCEUR CLEAR 02/14/2018 1951   LABSPEC 1.005 02/14/2018 1951   PHURINE 6.0 02/14/2018 1951   GLUCOSEU NEGATIVE 02/14/2018 1951   HGBUR SMALL (A) 02/14/2018 1951   BILIRUBINUR NEGATIVE 02/14/2018 1951   KETONESUR NEGATIVE 02/14/2018 1951   PROTEINUR NEGATIVE 02/14/2018 1951   UROBILINOGEN 0.2 02/17/2013 1340   NITRITE NEGATIVE 02/14/2018 1951   LEUKOCYTESUR NEGATIVE 02/14/2018 1951     Wayden Schwertner M.D. [facility] 02/16/2018, 12:07 PM  Pager: [phone] Between 7am to 7pm -  call Pager - [phone]  After 7pm go to [email or web address] - password [email or web address]  Call night coverage person covering after 7pm

## 2018-02-16 NOTE — Progress Notes (Signed)
ANTICOAGULATION CONSULT NOTE - Follow Up Consult  Pharmacy Consult for  heparin Indication: hx pulmonary embolus; home xarelto on hold  Allergies  Allergen Reactions  . Prednisone   . Penicillins Rash    Has patient had a PCN reaction causing immediate rash, facial/tongue/throat swelling, SOB or lightheadedness with hypotension: Unknown Has patient had a PCN reaction causing severe rash involving mucus membranes or skin necrosis: Unknown Has patient had a PCN reaction that required hospitalization: Unknown Has patient had a PCN reaction occurring within the last 10 years: Unknown If all of the above answers are "NO", then may proceed with Cephalosporin use.     Patient Measurements: Height: 5\' 6"  (167.6 cm) Weight: 153 lb 7 oz (69.6 kg) IBW/kg (Calculated) : 59.3 Heparin Dosing Weight: 70 kg  Vital Signs: Temp: 98.3 F (36.8 C) (04/30 0537) Temp Source: Oral (04/30 0537) BP: 120/58 (04/30 0537) Pulse Rate: 83 (04/30 0537)  Labs: Recent Labs    02/14/18 2025 02/15/18 0117 02/15/18 0604 02/15/18 0936 02/16/18 0716  HGB 11.8*  --  10.1*  --  10.5*  HCT 33.8*  --  29.7*  --  30.4*  PLT 329  --  293  --  302  APTT  --  34  --  120* 92*  LABPROT  --   --   --  18.3*  --   INR  --   --   --  1.53  --   HEPARINUNFRC  --  1.64*  --  0.98*  --   CREATININE 1.00  --  0.90  --   --     Estimated Creatinine Clearance: 40.4 mL/min (by C-G formula based on SCr of 0.9 mg/dL).   Medications:  PTA: xarelto 20 mg daily (Last dose on 4/27 at 1830)  Assessment: Patient is an 84 y.o F with hx lung cancer and PE on xarelto 20 mg daily PTA. She presented to the ED on 4/28 with c/o weakness and was found to have a new hypodense mass in left kidney with suspicion for abscess.  PTA xarelto placed on hold and she was started on heparin drip in case surgical intervention is needed.  - 4/29: aspiration of renal mass with heparin drip resumed 4 hrs post procedure  Today, 02/16/2018: -  heparin and aPTT are therapeutic at 0.53 and 92, respectively, with drip running at 950 units/hr - cbc relatively stable - no bleeding documented   Goal of Therapy:  Heparin level 0.3-0.7 units/ml aPTT 66-102 seconds Monitor platelets by anticoagulation protocol: Yes   Plan:  - continue heparin 950 units/hr - with heparin level and aPTT correlating, will monitor with just heparin level for now. - will check heparin level at 3PM to confirm before changing to daily heparin level monitoring - monitor for s/s bleeding   Dekayla Prestridge P 02/16/2018,8:04 AM

## 2018-02-17 ENCOUNTER — Inpatient Hospital Stay (HOSPITAL_COMMUNITY): Payer: Medicare Other

## 2018-02-17 DIAGNOSIS — D72829 Elevated white blood cell count, unspecified: Secondary | ICD-10-CM

## 2018-02-17 LAB — CBC
HEMATOCRIT: 35 % — AB (ref 36.0–46.0)
Hemoglobin: 12.1 g/dL (ref 12.0–15.0)
MCH: 30.3 pg (ref 26.0–34.0)
MCHC: 34.6 g/dL (ref 30.0–36.0)
MCV: 87.7 fL (ref 78.0–100.0)
Platelets: 317 10*3/uL (ref 150–400)
RBC: 3.99 MIL/uL (ref 3.87–5.11)
RDW: 14.5 % (ref 11.5–15.5)
WBC: 17.1 10*3/uL — ABNORMAL HIGH (ref 4.0–10.5)

## 2018-02-17 LAB — HEPARIN LEVEL (UNFRACTIONATED): Heparin Unfractionated: 0.44 IU/mL (ref 0.30–0.70)

## 2018-02-17 MED ORDER — RIVAROXABAN 20 MG PO TABS
20.0000 mg | ORAL_TABLET | Freq: Once | ORAL | Status: AC
  Administered 2018-02-17: 20 mg via ORAL
  Filled 2018-02-17: qty 1

## 2018-02-17 MED ORDER — RIVAROXABAN 20 MG PO TABS: 20.0000 mg | ORAL_TABLET | Freq: Once | ORAL | Status: DC

## 2018-02-17 MED ORDER — RIVAROXABAN 20 MG PO TABS
20.0000 mg | ORAL_TABLET | Freq: Every day | ORAL | Status: DC
  Administered 2018-02-18: 20 mg via ORAL
  Filled 2018-02-17: qty 1

## 2018-02-17 MED ORDER — POLYETHYLENE GLYCOL 3350 17 G PO PACK
17.0000 g | PACK | Freq: Every day | ORAL | Status: AC
  Administered 2018-02-17 – 2018-02-18 (×2): 17 g via ORAL
  Filled 2018-02-17 (×2): qty 1

## 2018-02-17 NOTE — Progress Notes (Signed)
Rapid Response Event Note  Overview:  Called to room for patient having loss of consciousness while up to [facility] attempting to have BM. Per primary RN, pt had strong pulse, but did not arouse to painful stimuli for 1-2 minutes.  Initial Focused Assessment: Pt AOx3, following commands, VSS and documented in flowsheet. Hand grips equal.   Interventions: Spoke with NP who believes patient would benefit from being placed on continuous cardiac monitoring.   Plan of Care (if not transferred): RN or NT to assist and remain at bedside while patient is getting up out of bed. RN or NT to remain with patient during ambulation or while patient is up to [facility]. I encourage patient to use bedpan at this time and to try not to bare down during BM, patient may also benefit from a stool softener. Patient being place on continuous cardiac monitoring at this time.  Event Summary:  [name]

## 2018-02-17 NOTE — Progress Notes (Signed)
Patient ID: [name], female   DOB: [birth year], 84 y.o.   MRN: [id number]                                                                PROGRESS NOTE                                                                                                                                                                                                             Patient Demographics:    [name], is a 84 y.o. female, DOB - [birth year], ERD:[id number]  Admit date - 02/14/2018   Admitting Physician Jani Gravel, MD  Outpatient Primary MD for the patient is Derinda Late, MD  LOS - 3  Outpatient Specialists:     Chief Complaint  Patient presents with  . Weakness       Brief Narrative    84 y.o. female, w stage 4 adenocarcinoma left lung, h/o PE, peripheral neuropathy, h/o unilateral nephrectomy 1994 apparently presents with c/o fatigue as well as left sided abdominal discomfort. Pt is somewhat of a poor historian.  Pt denies fever, chills, cough, cp, palp, n/v, diarrhea, brbpr, black stool, dysuria, hematuria.   In ED,  CT scan abd/ pelvis IMPRESSION: 1. Fibrosis or atelectasis in the lung bases. Diffuse bronchial wall thickening suggesting chronic bronchitis. 2. 6 mm nodule in the right lung base associated with linear scarring is more prominent than previous study and could represent inflammatory process or developing metastatic nodule. 3. Multiple hypoenhancing liver lesions, largest measuring up to about 6 cm diameter. Similar to previous study. Likely metastatic disease. 4. There is a new 3.2 cm hypodense mass in the left kidney with peripheral enhancement. This is most likely to represent a renal abscess. Renal cell carcinoma is less likely due to development of the lesion in a short interval since previous study. 5. Aortic atherosclerosis. 6. Diverticulosis of the colon without evidence of diverticulitis.  CXR IMPRESSION: No evidence of active disease  Urinalysis  negative  Na 136, K 3.8, Bun 12, Creatinine 1.00,  Ast 37, Alt 31 Lipase 38 Wbc 9.2, Hgb 11.8, Plt 329  Per Pa, she discussed case with Dr. Louis Meckel who recommended consulting IR for drainage.   Pt will be admitted for possible renal abscess     Subjective:  [name] today had overnite a lightheadedness, vagal episode when trying to go to the bathroom.  No syncope. Pt denies cp, palp, sob, n/v, diarrhea, brbpr.  + contstipation.   No headache,  No abdominal pain -  No new weakness tingling or numbness,     Assessment  & Plan :    Principal Problem:   Renal lesion Active Problems:   Adenocarcinoma of left lung, stage 4 (HCC)   Aortic atherosclerosis (HCC)    Principal Problem: Left renal lesion concerning for abscess  -CT abdomen pelvis showed new 3.2 cm hypodense mass in the left kidney with peripheral enhancement, likely renal abscess.  6 mm nodule in the right lung base could represent inflammatory process or developing metastatic nodule. -Interventional radiology was consulted, patient underwent ultrasound-guided aspiration of the left renal mass yielded 1 mL of purulent material, follow cultures => ngtd -Blood cultures negative to date, urine culture negative   Active Problems:  Leukocytosis Check CXR  Cbc, cmp  in am  Dizziness resolved ? Vasovagal episode while in bathroom Monitor  Metastatic adenocarcinoma of left lung, stage 4 ([place]) -Follow outpatient with oncology    Aortic atherosclerosis ([place]) -No chest pain   Pulmonary embolism in 2011 -Currently on heparin drip -Will restart on Xarelto   Code Status: full  DVT Prophylaxis:  heparin=> xarelto Family Communication: Discussed in detail with the patient, all imaging results, lab results explained to the patient  Disposition Plan:   Time Spent in minutes   35 minutes     Lab Results  Component Value Date   PLT 317 02/17/2018    Antibiotics  :  Aztreonam 4/28,  Meropenem  4/29=>  Anti-infectives (From admission, onward)   Start     Dose/Rate Route Frequency Ordered Stop   02/15/18 0030  meropenem (MERREM) 1 g in sodium chloride 0.9 % 100 mL IVPB     1 g 200 mL/hr over 30 Minutes Intravenous Every 12 hours 02/15/18 0020     02/14/18 2300  aztreonam (AZACTAM) 1 g in sodium chloride 0.9 % 100 mL IVPB     1 g 200 mL/hr over 30 Minutes Intravenous  Once 02/14/18 2252 02/14/18 2350   02/14/18 2245  aztreonam (AZACTAM) 1 g in sodium chloride 0.9 % 100 mL IVPB  Status:  Discontinued     1 g 200 mL/hr over 30 Minutes Intravenous  Once 02/14/18 2244 02/14/18 2252        Objective:   Vitals:   02/16/18 0537 02/16/18 1424 02/17/18 0603 02/17/18 0623  BP: (!) 120/58 (!) 135/58 (!) 103/52 (!) 101/55  Pulse: 83 91 88 93  Resp: 16 17 (!) 25 (!) 28  Temp: 98.3 F (36.8 C) 98.2 F (36.8 C) 98.9 F (37.2 C)   TempSrc: Oral Oral Oral   SpO2: 97% 99% 95% 95%  Weight:      Height:        Wt Readings from Last 3 Encounters:  02/15/18 69.6 kg (153 lb 7 oz)  01/14/18 70.7 kg (155 lb 14.4 oz)  12/25/17 72.1 kg (159 lb)     Intake/Output Summary (Last 24 hours) at 02/17/2018 0811 Last data filed at 02/17/2018 0600 Gross per 24 hour  Intake 1023.47 ml  Output -  Net 1023.47 ml     Physical Exam  Awake Alert, Oriented X 3, No new F.N deficits, Normal affect [place].AT,PERRAL Supple Neck,No JVD, No cervical lymphadenopathy appriciated.  Symmetrical Chest wall movement, Good air movement bilaterally,  Few  crackles bilateral bases, no wheezing  RRR,No Gallops,Rubs or new Murmurs, No Parasternal Heave +ve B.Sounds, Abd Soft, No tenderness, No organomegaly appriciated, No rebound - guarding or rigidity. No Cyanosis, Clubbing or edema, No new Rash or bruise      Data Review:    CBC Recent Labs  Lab 02/14/18 2025 02/15/18 0604 02/16/18 0716 02/17/18 0148  WBC 9.2 9.9 12.0* 17.1*  HGB 11.8* 10.1* 10.5* 12.1  HCT 33.8* 29.7* 30.4* 35.0*  PLT 329 293 302  317  MCV 87.8 87.9 87.9 87.7  MCH 30.6 29.9 30.3 30.3  MCHC 34.9 34.0 34.5 34.6  RDW 14.4 14.5 14.6 14.5  LYMPHSABS 1.5  --   --   --   MONOABS 1.3*  --   --   --   EOSABS 0.2  --   --   --   BASOSABS 0.0  --   --   --     Chemistries  Recent Labs  Lab 02/14/18 2025 02/15/18 0604  NA 136 134*  K 3.8 3.8  CL 102 104  CO2 25 21*  GLUCOSE 119* 103*  BUN 12 10  CREATININE 1.00 0.90  CALCIUM 8.8* 8.2*  AST 37 33  ALT 31 25  ALKPHOS 387* 308*  BILITOT 0.9 1.1   ------------------------------------------------------------------------------------------------------------------ No results for input(s): CHOL, HDL, LDLCALC, TRIG, CHOLHDL, LDLDIRECT in the last 72 hours.  Lab Results  Component Value Date   HGBA1C 5.5 11/12/2017   ------------------------------------------------------------------------------------------------------------------ No results for input(s): TSH, T4TOTAL, T3FREE, THYROIDAB in the last 72 hours.  Invalid input(s): FREET3 ------------------------------------------------------------------------------------------------------------------ No results for input(s): VITAMINB12, FOLATE, FERRITIN, TIBC, IRON, RETICCTPCT in the last 72 hours.  Coagulation profile Recent Labs  Lab 02/15/18 0936  INR 1.53    No results for input(s): DDIMER in the last 72 hours.  Cardiac Enzymes No results for input(s): CKMB, TROPONINI, MYOGLOBIN in the last 168 hours.  Invalid input(s): CK ------------------------------------------------------------------------------------------------------------------ No results found for: BNP  Inpatient Medications  Scheduled Meds: . feeding supplement (PRO-STAT SUGAR FREE 64)  30 mL Oral BID  . gabapentin  300 mg Oral QHS  . lactose free nutrition  237 mL Oral BID BM  . metoprolol succinate  12.5 mg Oral Daily   Continuous Infusions: . heparin 1,100 Units/hr (02/16/18 1722)  . meropenem [facility]) IV 1 g (02/16/18 2126)   PRN  Meds:.acetaminophen **OR** acetaminophen, ondansetron (ZOFRAN) IV  Micro Results Recent Results (from the past 240 hour(s))  Urine culture     Status: None   Collection Time: 02/14/18  7:51 PM  Result Value Ref Range Status   Specimen Description   Final    URINE, CLEAN CATCH Performed at [facility], [place] [address], [place], [place] [address]    Special Requests   Final    NONE Performed at [facility], [place] [address], [place], [place] [address]    Culture   Final    NO GROWTH Performed at [facility], [place] [address], [place], [place] [address]    Report Status 02/16/2018 FINAL  Final  Culture, blood (routine x 2)     Status: None (Preliminary result)   Collection Time: 02/14/18 11:29 PM  Result Value Ref Range Status   Specimen Description   Final    BLOOD RIGHT HAND Performed at [facility] [address] [place],  [address]    Special Requests   Final    BOTTLES DRAWN AEROBIC ONLY Blood Culture results  may not be optimal due to an inadequate volume of blood received in culture bottles Performed at [facility] [address] [place], [place] [address]    Culture   Final    NO GROWTH 1 DAY Performed at [facility], [place] [address], [place], [place] [address]    Report Status PENDING  Incomplete  Culture, blood (routine x 2)     Status: None (Preliminary result)   Collection Time: 02/15/18  1:17 AM  Result Value Ref Range Status   Specimen Description   Final    BLOOD RIGHT HAND Performed at [facility] [address], [place], [place] [address]    Special Requests   Final    BOTTLES DRAWN AEROBIC ONLY Blood Culture adequate volume Performed at [facility] [address] [place], [place] [address]    Culture   Final    NO GROWTH 1 DAY Performed at [facility], [place] [address], [place], [place] [address]    Report  Status PENDING  Incomplete  Aerobic/Anaerobic Culture (surgical/deep wound)     Status: None (Preliminary result)   Collection Time: 02/15/18  4:59 PM  Result Value Ref Range Status   Specimen Description   Final    NONE Performed at [facility] [address], [place], [place] [address]    Special Requests   Final    RENAL Performed at [facility] [address] [place], [place] [address]    Gram Stain   Final    ABUNDANT WBC PRESENT, PREDOMINANTLY PMN NO SQUAMOUS EPITHELIAL CELLS SEEN NO ORGANISMS SEEN    Culture   Final    CULTURE REINCUBATED FOR BETTER GROWTH Performed at [facility], [place] [address], [place], [place] [address]    Report Status PENDING  Incomplete    Radiology Reports Dg Chest 2 View  Result Date: 02/14/2018 CLINICAL DATA:  Shortness of breath. EXAM: CHEST - 2 VIEW COMPARISON:  Chest CT 12/10/2017 FINDINGS: Borderline heart size. Negative aortic and hilar contours. Hazy nodule at the left apex that is underestimated relative to comparison CT. There is no edema, consolidation, effusion, or pneumothorax. Exaggerated thoracic kyphosis. No acute osseous finding. IMPRESSION: No evidence of active disease Electronically Signed   By: Monte Fantasia M.D.   On: 02/14/2018 21:30   US Guided Needle Placement  Result Date: 02/15/2018 INDICATION: 84 year old female with a history of stage IV left lung adenocarcinoma and biopsy-proven liver metastasis. She has a new hypoechoic lesion versus fluid collection in the left kidney concerning for renal abscess, or less likely metastatic disease. She presents for ultrasound-guided aspiration versus biopsy versus drain placement. EXAM: Ultrasound-guided aspiration MEDICATIONS: The patient is currently admitted to the hospital and receiving intravenous antibiotics. The antibiotics were administered within an appropriate time frame prior to the initiation of the procedure.  ANESTHESIA/SEDATION: Fentanyl 50 mcg IV; Versed 1 mg IV Moderate Sedation Time:  17 minutes The patient was continuously monitored during the procedure by the interventional radiology nurse under my direct supervision. COMPLICATIONS: None immediate. PROCEDURE: Informed written consent was obtained from the patient after a thorough discussion of the procedural risks, benefits and alternatives. All questions were addressed. Maximal Sterile Barrier Technique was utilized including caps, mask, sterile gowns, sterile gloves, sterile drape, hand hygiene and skin antiseptic. A timeout was performed prior to the initiation of the procedure. The left flank was interrogated with ultrasound. The kidney is extremely difficult  to visualize. Several different patient positions were tried. Ultimately, a region of relative hypoechogenicity within the left interpolar kidney was successfully identified. The overlying skin was marked. Local anesthesia was attained by infiltration with 1% lidocaine. A small dermatotomy was made. Under real-time sonographic guidance, an 18 gauge trocar needle was advanced into the fluid collection. Aspiration was performed yielding approximately 1 mL of purulence bloody fluid. This was sent for Gram stain and culture. There appears to be insufficient liquid component to facilitate placement of a percutaneous drainage catheter. The trocar needle was removed. A Band-Aid was applied. IMPRESSION: Ultrasound-guided aspiration of vague hypoechoic region in the left interpolar kidney yields approximately 1 mL of bloody purulent fluid. Findings are most suggestive of renal abscess, however this is not well organized and there is insufficient fluid component to allow for percutaneous drain placement at this time. Electronically Signed   By: Jacqulynn Cadet M.D.   On: 02/15/2018 17:14   Ct Abdomen Pelvis W Contrast  Result Date: 02/14/2018 CLINICAL DATA:  Acute generalized abdominal pain. Pain is getting  worse. Painful urination. Being treated for urinary tract infection. EXAM: CT ABDOMEN AND PELVIS WITH CONTRAST TECHNIQUE: Multidetector CT imaging of the abdomen and pelvis was performed using the standard protocol following bolus administration of intravenous contrast. CONTRAST:  136mL ISOVUE-300 IOPAMIDOL (ISOVUE-300) INJECTION 61% COMPARISON:  12/10/2017 FINDINGS: Lower chest: Linear fibrosis or atelectasis demonstrated in the lung bases. Diffuse bronchial wall thickening suggesting chronic bronchitis. Nodule in the right lung base posteriorly associated with linear scarring and measuring about 6 mm diameter. This appears more prominent than the previous study and could be inflammatory or developing metastasis. Small esophageal hiatal hernia. Hepatobiliary: Multiple heterogeneous low-attenuation mass is demonstrated throughout the liver. The largest encompasses most of the posterior right lobe and measures up to about 6 cm in diameter. Appearances are similar to previous study and likely representing metastatic disease. Gallbladder and bile ducts are unremarkable. Pancreas: Unremarkable. No pancreatic ductal dilatation or surrounding inflammatory changes. Spleen: Calcified granuloma in the spleen.  No other focal lesions. Adrenals/Urinary Tract: No adrenal gland nodules. Surgical absence of the right kidney. No mass or fluid collection in the right renal fossa. Heterogeneous low-attenuation mass in the midpole of the left kidney measures about 3.2 cm in diameter and has peripheral enhancement. This is new since the previous study. Given the short interval time of development, this likely represents an abscess. Renal cell carcinoma would be another consideration but less likely given the timing. No hydronephrosis or hydroureter. Bladder is unremarkable. Stomach/Bowel: Stomach, small bowel, and colon are mostly decompressed. Scattered stool in the colon. Sigmoid colonic diverticula without evidence of  diverticulitis. Appendiceal stump. Surgical absence of the appendix. No inflammatory wall thickening present. Vascular/Lymphatic: Aortic atherosclerosis. No enlarged abdominal or pelvic lymph nodes. Reproductive: Status post hysterectomy. No adnexal masses. Other: No abdominal wall hernia or abnormality. No abdominopelvic ascites. Musculoskeletal: Degenerative changes in the spine. Postoperative right hip arthroplasty. No destructive bone lesions. IMPRESSION: 1. Fibrosis or atelectasis in the lung bases. Diffuse bronchial wall thickening suggesting chronic bronchitis. 2. 6 mm nodule in the right lung base associated with linear scarring is more prominent than previous study and could represent inflammatory process or developing metastatic nodule. 3. Multiple hypoenhancing liver lesions, largest measuring up to about 6 cm diameter. Similar to previous study. Likely metastatic disease. 4. There is a new 3.2 cm hypodense mass in the left kidney with peripheral enhancement. This is most likely to represent a renal abscess. Renal cell carcinoma  is less likely due to development of the lesion in a short interval since previous study. 5. Aortic atherosclerosis. 6. Diverticulosis of the colon without evidence of diverticulitis. Electronically Signed   By: Lucienne Capers M.D.   On: 02/14/2018 22:11    Time Spent in minutes  30   Jani Gravel M.D on 02/17/2018 at 8:11 AM  Between 7am to 7pm - Pager - [phone]  After 7pm go to [email or web address] - password [facility]  [facility] -  Office  [phone]

## 2018-02-17 NOTE — Care Management Important Message (Signed)
Important Message  Patient Details  Name: [name] MRN: [id number] Date of Birth: 02/21/1928   Medicare Important Message Given:  Yes    Kerin Salen 02/17/2018, 12:42 [place] Message  Patient Details  Name: [name] MRN: [id number] Date of Birth: July 15, 1928   Medicare Important Message Given:  Yes    Kerin Salen 02/17/2018, 12:42 PM

## 2018-02-17 NOTE — Progress Notes (Addendum)
ANTICOAGULATION CONSULT NOTE - Follow Up Consult  Pharmacy Consult for  Heparin --> xarelto Indication: hx pulmonary embolus  Allergies  Allergen Reactions  . Prednisone   . Penicillins Rash    Has patient had a PCN reaction causing immediate rash, facial/tongue/throat swelling, SOB or lightheadedness with hypotension: Unknown Has patient had a PCN reaction causing severe rash involving mucus membranes or skin necrosis: Unknown Has patient had a PCN reaction that required hospitalization: Unknown Has patient had a PCN reaction occurring within the last 10 years: Unknown If all of the above answers are "NO", then may proceed with Cephalosporin use.     Patient Measurements: Height: 5\' 6"  (167.6 cm) Weight: 153 lb 7 oz (69.6 kg) IBW/kg (Calculated) : 59.3 Heparin Dosing Weight: 70 kg  Vital Signs: Temp: 98.9 F (37.2 C) (05/01 0603) Temp Source: Oral (05/01 0603) BP: 101/55 (05/01 0623) Pulse Rate: 93 (05/01 0623)  Labs: Recent Labs    02/14/18 2025  02/15/18 0117 02/15/18 0604 02/15/18 0936 02/16/18 0716 02/16/18 1506 02/17/18 0148  HGB 11.8*  --   --  10.1*  --  10.5*  --  12.1  HCT 33.8*  --   --  29.7*  --  30.4*  --  35.0*  PLT 329  --   --  293  --  302  --  317  APTT  --   --  34  --  120* 92*  --   --   LABPROT  --   --   --   --  18.3*  --   --   --   INR  --   --   --   --  1.53  --   --   --   HEPARINUNFRC  --    < > 1.64*  --  0.98* 0.53 0.23* 0.44  CREATININE 1.00  --   --  0.90  --   --   --   --    < > = values in this interval not displayed.    Estimated Creatinine Clearance: 40.4 mL/min (by C-G formula based on SCr of 0.9 mg/dL).   Medications:  PTA: xarelto 20 mg daily (Last dose on 4/27 at 1830)  Assessment: Patient is an 84 y.o F with hx lung cancer and PE on xarelto 20 mg daily PTA. She presented to the ED on 4/28 with c/o weakness and was found to have a new hypodense mass in left kidney with suspicion for abscess.  PTA xarelto placed on  hold and she was started on heparin drip in case surgical intervention is needed. S/p aspiration of renal mass on 4/29 with heparin drip resumed 4 hrs post procedure.  To transition patient back to xarelto on 5/1  Today, 02/17/2018: - cbc relatively stable - no bleeding documented - scr om (crcl>50)   Goal of Therapy:  Heparin level 0.3-0.7 units/ml aPTT 66-102 seconds Monitor platelets by anticoagulation protocol: Yes   Plan:  - d/c heparin drip and give first dose of xarelto 20 mg daily - with good renal function, pharmacy will sign off for xarelto but will follow patient peripherally along with you.   Javier Gell P 02/17/2018,8:44 AM

## 2018-02-17 NOTE — Progress Notes (Signed)
LOC x 1-2 minutes while patient sitting on toilet attempting to move bowls(Nurse tech supporting pt).  Positive sternal rub/strong pulse on toilet.  Carried  back to bed, placed in trendelenburg position x 1 minute with immediate relief.  B/p 103/52 while in trendelenburg.  Alert x 3 at present.  Rapid response Rn at bedside

## 2018-02-17 NOTE — Progress Notes (Signed)
ANTICOAGULATION CONSULT NOTE - Follow Up Consult  Pharmacy Consult for  heparin Indication: hx pulmonary embolus; home xarelto on hold  Allergies  Allergen Reactions  . Prednisone   . Penicillins Rash    Has patient had a PCN reaction causing immediate rash, facial/tongue/throat swelling, SOB or lightheadedness with hypotension: Unknown Has patient had a PCN reaction causing severe rash involving mucus membranes or skin necrosis: Unknown Has patient had a PCN reaction that required hospitalization: Unknown Has patient had a PCN reaction occurring within the last 10 years: Unknown If all of the above answers are "NO", then may proceed with Cephalosporin use.     Patient Measurements: Height: 5\' 6"  (167.6 cm) Weight: 153 lb 7 oz (69.6 kg) IBW/kg (Calculated) : 59.3 Heparin Dosing Weight: 70 kg  Vital Signs:    Labs: Recent Labs    02/14/18 2025  02/15/18 0117 02/15/18 0604 02/15/18 0936 02/16/18 0716 02/16/18 1506 02/17/18 0148  HGB 11.8*  --   --  10.1*  --  10.5*  --  12.1  HCT 33.8*  --   --  29.7*  --  30.4*  --  35.0*  PLT 329  --   --  293  --  302  --  317  APTT  --   --  34  --  120* 92*  --   --   LABPROT  --   --   --   --  18.3*  --   --   --   INR  --   --   --   --  1.53  --   --   --   HEPARINUNFRC  --    < > 1.64*  --  0.98* 0.53 0.23* 0.44  CREATININE 1.00  --   --  0.90  --   --   --   --    < > = values in this interval not displayed.    Estimated Creatinine Clearance: 40.4 mL/min (by C-G formula based on SCr of 0.9 mg/dL).   Medications:  PTA: xarelto 20 mg daily (Last dose on 4/27 at 1830)  Assessment: Patient is an 84 y.o F with hx lung cancer and PE on xarelto 20 mg daily PTA. She presented to the ED on 4/28 with c/o weakness and was found to have a new hypodense mass in left kidney with suspicion for abscess.  PTA xarelto placed on hold and she was started on heparin drip in case surgical intervention is needed.  - 4/29: aspiration of renal  mass with heparin drip resumed 4 hrs post procedure  4/30 - heparin and aPTT are therapeutic at 0.53 and 92, respectively, with drip running at 950 units/hr - cbc relatively stable - no bleeding documented 2nd shift update: - heparin level = 0.23 with heparin infusing @ 950 units/hr (previous heparin level was therapeutic) - spoke with RN and no disruption in therapy - no signs/symptoms of bleeding noted  Today, 5/1 -0148 HL=0.44 at goal, no bleeding or IV issues per RN.   Goal of Therapy:  Heparin level 0.3-0.7 units/ml aPTT 66-102 seconds Monitor platelets by anticoagulation protocol: Yes   Plan:  -Continue heparin drip at 1100 units/hr - Recheck HL today to ensure stays in range - follow CBC daily - monitor for s/s bleeding  Dorrene German 02/17/2018,2:46 AM

## 2018-02-18 DIAGNOSIS — N151 Renal and perinephric abscess: Principal | ICD-10-CM

## 2018-02-18 DIAGNOSIS — D649 Anemia, unspecified: Secondary | ICD-10-CM

## 2018-02-18 LAB — CBC
HCT: 29 % — ABNORMAL LOW (ref 36.0–46.0)
Hemoglobin: 10 g/dL — ABNORMAL LOW (ref 12.0–15.0)
MCH: 30.2 pg (ref 26.0–34.0)
MCHC: 34.5 g/dL (ref 30.0–36.0)
MCV: 87.6 fL (ref 78.0–100.0)
PLATELETS: 279 10*3/uL (ref 150–400)
RBC: 3.31 MIL/uL — AB (ref 3.87–5.11)
RDW: 14.6 % (ref 11.5–15.5)
WBC: 14.4 10*3/uL — AB (ref 4.0–10.5)

## 2018-02-18 LAB — COMPREHENSIVE METABOLIC PANEL
ALK PHOS: 309 U/L — AB (ref 38–126)
ALT: 26 U/L (ref 14–54)
AST: 44 U/L — AB (ref 15–41)
Albumin: 2.2 g/dL — ABNORMAL LOW (ref 3.5–5.0)
Anion gap: 9 (ref 5–15)
BUN: 17 mg/dL (ref 6–20)
CHLORIDE: 103 mmol/L (ref 101–111)
CO2: 24 mmol/L (ref 22–32)
CREATININE: 0.95 mg/dL (ref 0.44–1.00)
Calcium: 8.5 mg/dL — ABNORMAL LOW (ref 8.9–10.3)
GFR calc Af Amer: >60 mL/min (ref >60)
GFR calc non Af Amer: 52 mL/min — ABNORMAL LOW (ref >60)
GLUCOSE: 123 mg/dL — AB (ref 65–99)
Potassium: 4.1 mmol/L (ref 3.5–5.1)
SODIUM: 136 mmol/L (ref 135–145)
Total Bilirubin: 1 mg/dL (ref 0.3–1.2)
Total Protein: 6.4 g/dL — ABNORMAL LOW (ref 6.5–8.1)

## 2018-02-18 MED ORDER — MINERAL OIL RE ENEM
1.0000 | ENEMA | Freq: Every day | RECTAL | Status: DC | PRN
  Administered 2018-02-18: 1 via RECTAL
  Filled 2018-02-18 (×2): qty 1

## 2018-02-18 NOTE — Care Management Obs Status (Signed)
[facility] NOTIFICATION   Patient Details  Name: [name] MRN: [id number] Date of Birth: 01/01/1928   Medicare Observation Status Notification Given:  Yes    Leeroy Cha, RN 02/18/2018, 4:24 PM

## 2018-02-18 NOTE — Progress Notes (Signed)
Pharmacy Antibiotic Note  [name] is a 84 y.o. female with lung cancer on afatinib PTA, admitted on 02/14/2018 with renal abscess.  Pharmacy has been consulted for meropenem dosing.  She got aztreonam 1 gm x 1 dose in ED. Per care everywhere her PCN allergy is rash- no record of previous cephalosporins in Epic.  Pt on cipro PTA.   - 4/29: aspiration of renal mass with noted purulent material  Today, 02/18/2018: - day #4 meropenem - afeb, wbc down 14.4 - scr 0.95 (crcl~38) - renal abscess cx with Ecoli (suscept. pending)  Plan: - continue Meropenem 1 gm IV q12h - f/u susceptibilities for ecoli and ability to de-escalate abx _______________________________________  Height: 5\' 6"  (167.6 cm) Weight: 153 lb 7 oz (69.6 kg) IBW/kg (Calculated) : 59.3  Temp (24hrs), Avg:98.6 F (37 C), Min:98.4 F (36.9 C), Max:99 F (37.2 C)  Recent Labs  Lab 02/14/18 2025 02/14/18 2032 02/15/18 0604 02/16/18 0716 02/17/18 0148 02/18/18 0634  WBC 9.2  --  9.9 12.0* 17.1* 14.4*  CREATININE 1.00  --  0.90  --   --  0.95  LATICACIDVEN  --  1.05  --   --   --   --     Estimated Creatinine Clearance: 38.3 mL/min (by C-G formula based on SCr of 0.95 mg/dL).    Allergies  Allergen Reactions  . Prednisone   . Penicillins Rash    Has patient had a PCN reaction causing immediate rash, facial/tongue/throat swelling, SOB or lightheadedness with hypotension: Unknown Has patient had a PCN reaction causing severe rash involving mucus membranes or skin necrosis: Unknown Has patient had a PCN reaction that required hospitalization: Unknown Has patient had a PCN reaction occurring within the last 10 years: Unknown If all of the above answers are "NO", then may proceed with Cephalosporin use.    Antimicrobials this admission: 4/28 aztreonam x 1 4/29 meropenem>>   Microbiology results: 4/28 Bcx x2: 4/28 Ucx: neg FINAL 4/29 renal abscess: abundant Ecoli   Thank you for allowing pharmacy to be a  part of this patient's care.  Dia Sitter, PharmD, BCPS 02/18/2018 8:08 AM

## 2018-02-18 NOTE — Progress Notes (Addendum)
Patient ID: [name], female   DOB: 12-19-1927, 84 y.o.   MRN: [id number]                                                                PROGRESS NOTE                                                                                                                                                                                                             Patient Demographics:    [name], is a 84 y.o. female, DOB - 1928-03-18, HKV:[id number]  Admit date - 02/14/2018   Admitting Physician Jani Gravel, MD  Outpatient Primary MD for the patient is Derinda Late, MD  LOS - 4  Outpatient Specialists:  Chief Complaint  Patient presents with  . Weakness       Brief Narrative 84 y.o.female,w stage 4 adenocarcinoma left lung, h/o PE, peripheral neuropathy, h/o unilateral nephrectomy 1994 apparently presents with c/o fatigue as well as left sided abdominal discomfort. Pt is somewhat of a poor historian. Pt denies fever, chills, cough, cp, palp, n/v, diarrhea, brbpr, black stool, dysuria, hematuria.   In ED,  CT scan abd/ pelvis IMPRESSION: 1. Fibrosis or atelectasis in the lung bases. Diffuse bronchial wall thickening suggesting chronic bronchitis. 2. 6 mm nodule in the right lung base associated with linear scarring is more prominent than previous study and could represent inflammatory process or developing metastatic nodule. 3. Multiple hypoenhancing liver lesions, largest measuring up to about 6 cm diameter. Similar to previous study. Likely metastatic disease. 4. There is a new 3.2 cm hypodense mass in the left kidney with peripheral enhancement. This is most likely to represent a renal abscess. Renal cell carcinoma is less likely due to development of the lesion in a short interval since previous study. 5. Aortic atherosclerosis. 6. Diverticulosis of the colon without evidence of diverticulitis.  CXR IMPRESSION: No evidence of active disease  Urinalysis negative  Na  136, K 3.8, Bun 12, Creatinine 1.00,  Ast 37, Alt 31 Lipase 38 Wbc 9.2, Hgb 11.8, Plt 329  Per Pa, she discussed case with Dr. Louis Meckel who recommended consulting IR for drainage.   Pt will be admitted for possible renal abscess       Subjective:    [name] today has been  feeling better  Afebrile overnite,  Awaiting culture.  Leukocytosis improving.    No headache, No chest pain, No abdominal pain - No Nausea, No new weakness tingling or numbness, No Cough - SOB.    Assessment  & Plan :    Principal Problem:   Renal lesion Active Problems:   Adenocarcinoma of left lung, stage 4 (HCC)   Aortic atherosclerosis (HCC)   Leukocytosis     Principal Problem: Left renal lesionconcerning for abscess  -CT abdomen pelvis showed new 3.2 cm hypodense mass in the left kidney with peripheral enhancement, likely renal abscess. 6 mm nodule in the right lung base could represent inflammatory process or developing metastatic nodule. -Interventional radiology was consulted, patient underwent ultrasound-guided aspiration of the left renal mass yielded 1 mL of purulent material, follow cultures => ngtd -Blood cultures negative to date, urine culture negative   Active Problems:  Leukocytosis improving,  CXR negative Awaiting culture of abscess Check cbc in am  Dizziness resolved ? Vasovagal episode while in bathroom 5/1 Monitor, no further episodes  Metastatic adenocarcinoma of left lung, stage 4 (HCC) -Follow outpatient with oncology  Aortic atherosclerosis ([place]) -No chest pain   Pulmonary embolism in 2011 -Heparin gtt=> Xarelto 5/1  Anemia stable Check cbc in am  Constipation Cont miralax Start mineral oil enema prn  Code Status:full DVT Prophylaxis:heparin=> xarelto Family Communication:Discussed in detail with the patient, all imaging results, lab results explained to the patient  Disposition Plan:  Time Spentin  minutes29minutes     RecentLabs       Lab Results  Component Value Date   PLT 317 02/17/2018      Antibiotics  :  Aztreonam 4/28,  Meropenem 4/29=>     Anti-infectives (From admission, onward)   Start     Dose/Rate Route Frequency Ordered Stop   02/15/18 0030  meropenem (MERREM) 1 g in sodium chloride 0.9 % 100 mL IVPB     1 g 200 mL/hr over 30 Minutes Intravenous Every 12 hours 02/15/18 0020     02/14/18 2300  aztreonam (AZACTAM) 1 g in sodium chloride 0.9 % 100 mL IVPB     1 g 200 mL/hr over 30 Minutes Intravenous  Once 02/14/18 2252 02/14/18 2350   02/14/18 2245  aztreonam (AZACTAM) 1 g in sodium chloride 0.9 % 100 mL IVPB  Status:  Discontinued     1 g 200 mL/hr over 30 Minutes Intravenous  Once 02/14/18 2244 02/14/18 2252        Objective:   Vitals:   02/17/18 1240 02/17/18 1408 02/17/18 2000 02/18/18 0536  BP: (!) 100/57 (!) 103/52 133/70 (!) 107/57  Pulse: 90 84 91 88  Resp:  16 17 (!) 23  Temp: 99 F (37.2 C) 98.4 F (36.9 C) 98.4 F (36.9 C) 98.6 F (37 C)  TempSrc: Oral Oral Oral Oral  SpO2: 97% 99% 93% 96%  Weight:      Height:        Wt Readings from Last 3 Encounters:  02/15/18 69.6 kg (153 lb 7 oz)  01/14/18 70.7 kg (155 lb 14.4 oz)  12/25/17 72.1 kg (159 lb)     Intake/Output Summary (Last 24 hours) at 02/18/2018 0807 Last data filed at 02/17/2018 1500 Gross per 24 hour  Intake 220 ml  Output -  Net 220 ml     Physical Exam  Awake Alert, Oriented X 3, No new F.N deficits, Normal affect [place].AT,PERRAL Supple Neck,No JVD, No cervical lymphadenopathy appriciated.  Symmetrical  Chest wall movement, Good air movement bilaterally, CTAB RRR,No Gallops,Rubs or new Murmurs, No Parasternal Heave +ve B.Sounds, Abd Soft, No tenderness, No organomegaly appriciated, No rebound - guarding or rigidity. No Cyanosis, Clubbing or edema, No new Rash or bruise      Data Review:    CBC Recent Labs  Lab 02/14/18 2025 02/15/18 0604  02/16/18 0716 02/17/18 0148 02/18/18 0634  WBC 9.2 9.9 12.0* 17.1* 14.4*  HGB 11.8* 10.1* 10.5* 12.1 10.0*  HCT 33.8* 29.7* 30.4* 35.0* 29.0*  PLT 329 293 302 317 279  MCV 87.8 87.9 87.9 87.7 87.6  MCH 30.6 29.9 30.3 30.3 30.2  MCHC 34.9 34.0 34.5 34.6 34.5  RDW 14.4 14.5 14.6 14.5 14.6  LYMPHSABS 1.5  --   --   --   --   MONOABS 1.3*  --   --   --   --   EOSABS 0.2  --   --   --   --   BASOSABS 0.0  --   --   --   --     Chemistries  Recent Labs  Lab 02/14/18 2025 02/15/18 0604 02/18/18 0634  NA 136 134* 136  K 3.8 3.8 4.1  CL 102 104 103  CO2 25 21* 24  GLUCOSE 119* 103* 123*  BUN 12 10 17   CREATININE 1.00 0.90 0.95  CALCIUM 8.8* 8.2* 8.5*  AST 37 33 44*  ALT 31 25 26   ALKPHOS 387* 308* 309*  BILITOT 0.9 1.1 1.0   ------------------------------------------------------------------------------------------------------------------ No results for input(s): CHOL, HDL, LDLCALC, TRIG, CHOLHDL, LDLDIRECT in the last 72 hours.  Lab Results  Component Value Date   HGBA1C 5.5 11/12/2017   ------------------------------------------------------------------------------------------------------------------ No results for input(s): TSH, T4TOTAL, T3FREE, THYROIDAB in the last 72 hours.  Invalid input(s): FREET3 ------------------------------------------------------------------------------------------------------------------ No results for input(s): VITAMINB12, FOLATE, FERRITIN, TIBC, IRON, RETICCTPCT in the last 72 hours.  Coagulation profile Recent Labs  Lab 02/15/18 0936  INR 1.53    No results for input(s): DDIMER in the last 72 hours.  Cardiac Enzymes No results for input(s): CKMB, TROPONINI, MYOGLOBIN in the last 168 hours.  Invalid input(s): CK ------------------------------------------------------------------------------------------------------------------ No results found for: BNP  Inpatient Medications  Scheduled Meds: . feeding supplement (PRO-STAT SUGAR  FREE 64)  30 mL Oral BID  . gabapentin  300 mg Oral QHS  . lactose free nutrition  237 mL Oral BID BM  . metoprolol succinate  12.5 mg Oral Daily  . polyethylene glycol  17 g Oral Daily  . rivaroxaban  20 mg Oral Q supper   Continuous Infusions: . meropenem (MERREM) IV Stopped (02/17/18 2136)   PRN Meds:.acetaminophen **OR** acetaminophen, ondansetron (ZOFRAN) IV  Micro Results Recent Results (from the past 240 hour(s))  Urine culture     Status: None   Collection Time: 02/14/18  7:51 PM  Result Value Ref Range Status   Specimen Description   Final    URINE, CLEAN CATCH Performed at [facility], [place] [address] [place], [place] [address]    Special Requests   Final    NONE Performed at [facility], [place] [address] [place], [place] [address]    Culture   Final    NO GROWTH Performed at [facility], [place] [address], [place], [place] [address]    Report Status 02/16/2018 FINAL  Final  Culture, blood (routine x 2)     Status: None (Preliminary result)   Collection Time: 02/14/18 11:29 PM  Result  Value Ref Range Status   Specimen Description   Final    BLOOD RIGHT HAND Performed at [facility] [address], [place], [place] [address]    Special Requests   Final    BOTTLES DRAWN AEROBIC ONLY Blood Culture results may not be optimal due to an inadequate volume of blood received in culture bottles Performed at [facility] [address] [place] [address], [place], [place] [address]    Culture   Final    NO GROWTH 2 DAYS Performed at [facility] [address], [place], [place] [address]    Report Status PENDING  Incomplete  Culture, blood (routine x 2)     Status: None (Preliminary result)   Collection Time: 02/15/18  1:17 AM  Result Value Ref Range Status   Specimen Description   Final    BLOOD RIGHT HAND Performed at [facility] [address], [place], [place] [address]     Special Requests   Final    BOTTLES DRAWN AEROBIC ONLY Blood Culture adequate volume Performed at [facility] [address] [place], [place] [address]    Culture   Final    NO GROWTH 2 DAYS Performed at [facility] [address] [place], [place] [address]    Report Status PENDING  Incomplete  Aerobic/Anaerobic Culture (surgical/deep wound)     Status: None (Preliminary result)   Collection Time: 02/15/18  4:59 PM  Result Value Ref Range Status   Specimen Description   Final    NONE Performed at [facility] [address], [place], [place] [address]    Special Requests   Final    RENAL Performed at [facility] [address] [place], [place] [address]    Gram Stain   Final    ABUNDANT WBC PRESENT, PREDOMINANTLY PMN NO SQUAMOUS EPITHELIAL CELLS SEEN NO ORGANISMS SEEN Performed at [facility], [place] [address] [place], [place] [address]    Culture   Final    ABUNDANT ESCHERICHIA COLI NO ANAEROBES ISOLATED; CULTURE IN PROGRESS FOR 5 DAYS    Report Status PENDING  Incomplete    Radiology Reports Dg Chest 2 View  Result Date: 02/17/2018 CLINICAL DATA:  Leukocytosis, weakness. EXAM: CHEST - 2 VIEW COMPARISON:  Radiograph of February 14, 2018. FINDINGS: The heart size and mediastinal contours are within normal limits. Atherosclerosis of thoracic aorta is noted. No pneumothorax is noted. No significant pleural effusion is noted. Mild bibasilar subsegmental atelectasis is noted. The visualized skeletal structures are unremarkable. IMPRESSION: Mild bibasilar subsegmental atelectasis. Aortic Atherosclerosis (ICD10-I70.0). Electronically Signed   By: Marijo Conception, M.D.   On: 02/17/2018 10:59   Dg Chest 2 View  Result Date: 02/14/2018 CLINICAL DATA:  Shortness of breath. EXAM: CHEST - 2 VIEW COMPARISON:  Chest CT 12/10/2017 FINDINGS: Borderline heart size. Negative aortic and hilar contours. Hazy nodule at  the left apex that is underestimated relative to comparison CT. There is no edema, consolidation, effusion, or pneumothorax. Exaggerated thoracic kyphosis. No acute osseous finding. IMPRESSION: No evidence of active disease Electronically Signed   By: Monte Fantasia M.D.   On: 02/14/2018 21:30   US Guided Needle Placement  Result Date: 02/15/2018 INDICATION: 84 year old female with a history of stage IV left lung adenocarcinoma and biopsy-proven liver metastasis. She has a new hypoechoic lesion versus fluid collection in the left kidney concerning for renal abscess, or less likely metastatic disease.  She presents for ultrasound-guided aspiration versus biopsy versus drain placement. EXAM: Ultrasound-guided aspiration MEDICATIONS: The patient is currently admitted to the hospital and receiving intravenous antibiotics. The antibiotics were administered within an appropriate time frame prior to the initiation of the procedure. ANESTHESIA/SEDATION: Fentanyl 50 mcg IV; Versed 1 mg IV Moderate Sedation Time:  17 minutes The patient was continuously monitored during the procedure by the interventional radiology nurse under my direct supervision. COMPLICATIONS: None immediate. PROCEDURE: Informed written consent was obtained from the patient after a thorough discussion of the procedural risks, benefits and alternatives. All questions were addressed. Maximal Sterile Barrier Technique was utilized including caps, mask, sterile gowns, sterile gloves, sterile drape, hand hygiene and skin antiseptic. A timeout was performed prior to the initiation of the procedure. The left flank was interrogated with ultrasound. The kidney is extremely difficult to visualize. Several different patient positions were tried. Ultimately, a region of relative hypoechogenicity within the left interpolar kidney was successfully identified. The overlying skin was marked. Local anesthesia was attained by infiltration with 1% lidocaine. A small  dermatotomy was made. Under real-time sonographic guidance, an 18 gauge trocar needle was advanced into the fluid collection. Aspiration was performed yielding approximately 1 mL of purulence bloody fluid. This was sent for Gram stain and culture. There appears to be insufficient liquid component to facilitate placement of a percutaneous drainage catheter. The trocar needle was removed. A Band-Aid was applied. IMPRESSION: Ultrasound-guided aspiration of vague hypoechoic region in the left interpolar kidney yields approximately 1 mL of bloody purulent fluid. Findings are most suggestive of renal abscess, however this is not well organized and there is insufficient fluid component to allow for percutaneous drain placement at this time. Electronically Signed   By: Jacqulynn Cadet M.D.   On: 02/15/2018 17:14   Ct Abdomen Pelvis W Contrast  Result Date: 02/14/2018 CLINICAL DATA:  Acute generalized abdominal pain. Pain is getting worse. Painful urination. Being treated for urinary tract infection. EXAM: CT ABDOMEN AND PELVIS WITH CONTRAST TECHNIQUE: Multidetector CT imaging of the abdomen and pelvis was performed using the standard protocol following bolus administration of intravenous contrast. CONTRAST:  168mL ISOVUE-300 IOPAMIDOL (ISOVUE-300) INJECTION 61% COMPARISON:  12/10/2017 FINDINGS: Lower chest: Linear fibrosis or atelectasis demonstrated in the lung bases. Diffuse bronchial wall thickening suggesting chronic bronchitis. Nodule in the right lung base posteriorly associated with linear scarring and measuring about 6 mm diameter. This appears more prominent than the previous study and could be inflammatory or developing metastasis. Small esophageal hiatal hernia. Hepatobiliary: Multiple heterogeneous low-attenuation mass is demonstrated throughout the liver. The largest encompasses most of the posterior right lobe and measures up to about 6 cm in diameter. Appearances are similar to previous study and likely  representing metastatic disease. Gallbladder and bile ducts are unremarkable. Pancreas: Unremarkable. No pancreatic ductal dilatation or surrounding inflammatory changes. Spleen: Calcified granuloma in the spleen.  No other focal lesions. Adrenals/Urinary Tract: No adrenal gland nodules. Surgical absence of the right kidney. No mass or fluid collection in the right renal fossa. Heterogeneous low-attenuation mass in the midpole of the left kidney measures about 3.2 cm in diameter and has peripheral enhancement. This is new since the previous study. Given the short interval time of development, this likely represents an abscess. Renal cell carcinoma would be another consideration but less likely given the timing. No hydronephrosis or hydroureter. Bladder is unremarkable. Stomach/Bowel: Stomach, small bowel, and colon are mostly decompressed. Scattered stool in the colon. Sigmoid colonic diverticula without evidence of diverticulitis. Appendiceal  stump. Surgical absence of the appendix. No inflammatory wall thickening present. Vascular/Lymphatic: Aortic atherosclerosis. No enlarged abdominal or pelvic lymph nodes. Reproductive: Status post hysterectomy. No adnexal masses. Other: No abdominal wall hernia or abnormality. No abdominopelvic ascites. Musculoskeletal: Degenerative changes in the spine. Postoperative right hip arthroplasty. No destructive bone lesions. IMPRESSION: 1. Fibrosis or atelectasis in the lung bases. Diffuse bronchial wall thickening suggesting chronic bronchitis. 2. 6 mm nodule in the right lung base associated with linear scarring is more prominent than previous study and could represent inflammatory process or developing metastatic nodule. 3. Multiple hypoenhancing liver lesions, largest measuring up to about 6 cm diameter. Similar to previous study. Likely metastatic disease. 4. There is a new 3.2 cm hypodense mass in the left kidney with peripheral enhancement. This is most likely to represent a  renal abscess. Renal cell carcinoma is less likely due to development of the lesion in a short interval since previous study. 5. Aortic atherosclerosis. 6. Diverticulosis of the colon without evidence of diverticulitis. Electronically Signed   By: Lucienne Capers M.D.   On: 02/14/2018 22:11    Time Spent in minutes  30   Jani Gravel M.D on 02/18/2018 at 8:07 AM  Between 7am to 7pm - Pager - [phone]  After 7pm go to [email or web address] - password [email or web address]  [facility] -  Office  [phone]

## 2018-02-19 ENCOUNTER — Telehealth: Payer: Self-pay | Admitting: Medical Oncology

## 2018-02-19 DIAGNOSIS — C3492 Malignant neoplasm of unspecified part of left bronchus or lung: Secondary | ICD-10-CM

## 2018-02-19 LAB — CBC
HCT: 30.1 % — ABNORMAL LOW (ref 36.0–46.0)
Hemoglobin: 10.3 g/dL — ABNORMAL LOW (ref 12.0–15.0)
MCH: 30.1 pg (ref 26.0–34.0)
MCHC: 34.2 g/dL (ref 30.0–36.0)
MCV: 88 fL (ref 78.0–100.0)
Platelets: 299 10*3/uL (ref 150–400)
RBC: 3.42 MIL/uL — ABNORMAL LOW (ref 3.87–5.11)
RDW: 14.7 % (ref 11.5–15.5)
WBC: 12.9 10*3/uL — ABNORMAL HIGH (ref 4.0–10.5)

## 2018-02-19 MED ORDER — POLYETHYLENE GLYCOL 3350 17 G PO PACK
17.0000 g | PACK | Freq: Once | ORAL | Status: AC
  Administered 2018-02-19: 17 g via ORAL

## 2018-02-19 MED ORDER — CIPROFLOXACIN HCL 500 MG PO TABS: 500.0000 mg | ORAL_TABLET | Freq: Two times a day (BID) | ORAL | 0 refills | Status: AC

## 2018-02-19 MED ORDER — PRO-STAT SUGAR FREE PO LIQD: 30.0000 mL | Freq: Two times a day (BID) | ORAL | 0 refills | Status: DC

## 2018-02-19 NOTE — Telephone Encounter (Signed)
Son requests wheelchair-I contacted Leward Quan and she will arrange for wheelchair-son notified.

## 2018-02-19 NOTE — Progress Notes (Signed)
Date: Feb 19, 2018 Velva Harman, BSN, [place], [place] Chart and notes review for patient progress and needs. Will follow for case management and discharge needs. Wheechair obtained through [facility] Next review date: [id number]

## 2018-02-19 NOTE — Discharge Summary (Addendum)
[name], is a 84 y.o. female  DOB 03-16-1928  MRN [id number].  Admission date:  02/14/2018  Admitting Physician  Jani Gravel, MD  Discharge Date:  02/19/2018   Primary MD  Derinda Late, MD  Recommendations for primary care physician for things to follow:    Left renal lesionconcerning for abscess  -CT abdomen pelvis showed new 3.2 cm hypodense mass in the left kidney with peripheral enhancement, likely renal abscess. 6 mm nodule in the right lung base could represent inflammatory process or developing metastatic nodule. -Interventional radiology was consulted, patient underwent ultrasound-guided aspiration (4/29) of the left renal mass yielded 1 mL of purulent material, Cultures (4/29)=> E. Coli, pansensitive -Blood cultures negative Cipro 500mg  po bid x 10 days Please f/u with pcp and oncology in 1 week    Dizziness resolved ? Vasovagal episode while in bathroom 5/1 Monitor, no further episodes  Metastatic adenocarcinoma of left lung, stage 4 (HCC) -Follow outpatient with oncology to restart on Gilotrif in 1 week  Pulmonary embolism in 2011 Cont Xarelto  Anemia stable Check cbc in 1 week with pcp  Abnormal liver function Check cmp in  1 weekwith pcp     Admission Diagnosis  Renal lesion [N28.9]   Discharge Diagnosis  Renal lesion [N28.9]      Principal Problem:   Abscess of left kidney Active Problems:   Adenocarcinoma of left lung, stage 4 (HCC)   Aortic atherosclerosis (HCC)   Leukocytosis      Past Medical History:  Diagnosis Date  . Adenocarcinoma of left lung, stage 4 ([name]) 03/18/2017  . Arthritis    Neck,shoulder  . Brain metastases ([name]) 04/06/2017  . Colonic polyp   . Difficulty sleeping   . Encounter for antineoplastic chemotherapy 04/06/2017  . History of radiation therapy 12/04/15-12/14/15   left upper lobe 60 Gy  . Hx of unilateral nephrectomy  1994   right   . Hyperlipemia   . Lung cancer ([name])   . Numbness in feet   . Peripheral neuropathy   . Personal history of PE (pulmonary embolism) 2011    Past Surgical History:  Procedure Laterality Date  . ABDOMINAL HYSTERECTOMY    . APPENDECTOMY    . cataracts Bilateral 09/2014   removal  . CESAREAN SECTION    . NEPHRECTOMY  1993   RT -   . TOTAL HIP ARTHROPLASTY Right 02/25/2013   Procedure: RIGHT TOTAL HIP ARTHROPLASTY ANTERIOR APPROACH;  Surgeon: Mcarthur Rossetti, MD;  Location: [facility];  Service: Orthopedics;  Laterality: Right;       HPI  from the history and physical done on the day of admission:     84 y.o.female,w stage 4 adenocarcinoma left lung, h/o PE, peripheral neuropathy, h/o unilateral nephrectomy 1994 apparently presents with c/o fatigue as well as left sided abdominal discomfort. Pt is somewhat of a poor historian. Pt denies fever, chills, cough, cp, palp, n/v, diarrhea, brbpr, black stool, dysuria, hematuria.   In ED,  CT scan abd/ pelvis  IMPRESSION: 1. Fibrosis or atelectasis in the lung bases. Diffuse bronchial wall thickening suggesting chronic bronchitis. 2. 6 mm nodule in the right lung base associated with linear scarring is more prominent than previous study and could represent inflammatory process or developing metastatic nodule. 3. Multiple hypoenhancing liver lesions, largest measuring up to about 6 cm diameter. Similar to previous study. Likely metastatic disease. 4. There is a new 3.2 cm hypodense mass in the left kidney with peripheral enhancement. This is most likely to represent a renal abscess. Renal cell carcinoma is less likely due to development of the lesion in a short interval since previous study. 5. Aortic atherosclerosis. 6. Diverticulosis of the colon without evidence of diverticulitis.  CXR IMPRESSION: No evidence of active disease  Urinalysis negative  Na 136, K 3.8, Bun 12, Creatinine 1.00,  Ast 37, Alt  31 Lipase 38 Wbc 9.2, Hgb 11.8, Plt 329  Per Pa, she discussed case with Dr. Louis Meckel who recommended consulting IR for drainage.   Pt will be admitted for possible renal abscess       Hospital Course:     Pt was admitted and her Gilitronib was held due to infection and IR aspiration of abscess ultrasound guided was performed on 4/29 yielding puss.  Pt was continued on  Meropenem .  Culture eventually grew out E. Coli pan sensitive.  Pt had leukocytosis on 5/1 .  CXR showed atelectasis.  Wbc has since improved to 14.4 (5/2) .  Pt had a vagal episode with dizziness while going to the bathroom on 5/1 but no further episodes.  Pt has been afebrile for 48 hours and feeling well and will be discharge to home.    Follow UP  Follow-up Information    Derinda Late, MD Follow up in 1 week(s).   Specialty:  Family Medicine Contact information: [place] [address] [phone]        Curt Bears, MD Follow up in 1 week(s).   Specialty:  Oncology Contact information: [place] [address] [phone]            Consults obtained - IR  Discharge Condition: stable  Diet and Activity recommendation: See Discharge Instructions below  Discharge Instructions         Discharge Medications     Allergies as of 02/19/2018      Reactions   Prednisone    Penicillins Rash   Has patient had a PCN reaction causing immediate rash, facial/tongue/throat swelling, SOB or lightheadedness with hypotension: Unknown Has patient had a PCN reaction causing severe rash involving mucus membranes or skin necrosis: Unknown Has patient had a PCN reaction that required hospitalization: Unknown Has patient had a PCN reaction occurring within the last 10 years: Unknown If all of the above answers are "NO", then may proceed with Cephalosporin use.      Medication List    STOP taking these medications   GILOTRIF 30 MG tablet Generic  drug:  afatinib dimaleate   methylPREDNISolone 4 MG Tbpk tablet Commonly known as:  MEDROL DOSEPAK     TAKE these medications   ciprofloxacin 500 MG tablet Commonly known as:  CIPRO Take 1 tablet (500 mg total) by mouth 2 (two) times daily for 10 days. What changed:    medication strength  how much to take  when to take this   feeding supplement (PRO-STAT SUGAR FREE 64) Liqd Take 30 mLs by mouth 2 (two) times daily.   gabapentin 100 MG  capsule Commonly known as:  NEURONTIN Take 300 mg by mouth at bedtime.   metoprolol succinate 25 MG 24 hr tablet Commonly known as:  TOPROL-XL Take 0.5 tablets (12.5 mg total) by mouth daily.   rivaroxaban 20 MG Tabs tablet Commonly known as:  XARELTO Take 20 mg by mouth daily with supper.       Major procedures and Radiology Reports - PLEASE review detailed and final reports for all details, in brief -       Dg Chest 2 View  Result Date: 02/17/2018 CLINICAL DATA:  Leukocytosis, weakness. EXAM: CHEST - 2 VIEW COMPARISON:  Radiograph of February 14, 2018. FINDINGS: The heart size and mediastinal contours are within normal limits. Atherosclerosis of thoracic aorta is noted. No pneumothorax is noted. No significant pleural effusion is noted. Mild bibasilar subsegmental atelectasis is noted. The visualized skeletal structures are unremarkable. IMPRESSION: Mild bibasilar subsegmental atelectasis. Aortic Atherosclerosis (ICD10-I70.0). Electronically Signed   By: Marijo Conception, M.D.   On: 02/17/2018 10:59   Dg Chest 2 View  Result Date: 02/14/2018 CLINICAL DATA:  Shortness of breath. EXAM: CHEST - 2 VIEW COMPARISON:  Chest CT 12/10/2017 FINDINGS: Borderline heart size. Negative aortic and hilar contours. Hazy nodule at the left apex that is underestimated relative to comparison CT. There is no edema, consolidation, effusion, or pneumothorax. Exaggerated thoracic kyphosis. No acute osseous finding. IMPRESSION: No evidence of active disease  Electronically Signed   By: Monte Fantasia M.D.   On: 02/14/2018 21:30   US Guided Needle Placement  Result Date: 02/15/2018 INDICATION: 84 year old female with a history of stage IV left lung adenocarcinoma and biopsy-proven liver metastasis. She has a new hypoechoic lesion versus fluid collection in the left kidney concerning for renal abscess, or less likely metastatic disease. She presents for ultrasound-guided aspiration versus biopsy versus drain placement. EXAM: Ultrasound-guided aspiration MEDICATIONS: The patient is currently admitted to the hospital and receiving intravenous antibiotics. The antibiotics were administered within an appropriate time frame prior to the initiation of the procedure. ANESTHESIA/SEDATION: Fentanyl 50 mcg IV; Versed 1 mg IV Moderate Sedation Time:  17 minutes The patient was continuously monitored during the procedure by the interventional radiology nurse under my direct supervision. COMPLICATIONS: None immediate. PROCEDURE: Informed written consent was obtained from the patient after a thorough discussion of the procedural risks, benefits and alternatives. All questions were addressed. Maximal Sterile Barrier Technique was utilized including caps, mask, sterile gowns, sterile gloves, sterile drape, hand hygiene and skin antiseptic. A timeout was performed prior to the initiation of the procedure. The left flank was interrogated with ultrasound. The kidney is extremely difficult to visualize. Several different patient positions were tried. Ultimately, a region of relative hypoechogenicity within the left interpolar kidney was successfully identified. The overlying skin was marked. Local anesthesia was attained by infiltration with 1% lidocaine. A small dermatotomy was made. Under real-time sonographic guidance, an 18 gauge trocar needle was advanced into the fluid collection. Aspiration was performed yielding approximately 1 mL of purulence bloody fluid. This was sent for  Gram stain and culture. There appears to be insufficient liquid component to facilitate placement of a percutaneous drainage catheter. The trocar needle was removed. A Band-Aid was applied. IMPRESSION: Ultrasound-guided aspiration of vague hypoechoic region in the left interpolar kidney yields approximately 1 mL of bloody purulent fluid. Findings are most suggestive of renal abscess, however this is not well organized and there is insufficient fluid component to allow for percutaneous drain placement at this time. Electronically Signed  By: Jacqulynn Cadet M.D.   On: 02/15/2018 17:14   Ct Abdomen Pelvis W Contrast  Result Date: 02/14/2018 CLINICAL DATA:  Acute generalized abdominal pain. Pain is getting worse. Painful urination. Being treated for urinary tract infection. EXAM: CT ABDOMEN AND PELVIS WITH CONTRAST TECHNIQUE: Multidetector CT imaging of the abdomen and pelvis was performed using the standard protocol following bolus administration of intravenous contrast. CONTRAST:  151mL ISOVUE-300 IOPAMIDOL (ISOVUE-300) INJECTION 61% COMPARISON:  12/10/2017 FINDINGS: Lower chest: Linear fibrosis or atelectasis demonstrated in the lung bases. Diffuse bronchial wall thickening suggesting chronic bronchitis. Nodule in the right lung base posteriorly associated with linear scarring and measuring about 6 mm diameter. This appears more prominent than the previous study and could be inflammatory or developing metastasis. Small esophageal hiatal hernia. Hepatobiliary: Multiple heterogeneous low-attenuation mass is demonstrated throughout the liver. The largest encompasses most of the posterior right lobe and measures up to about 6 cm in diameter. Appearances are similar to previous study and likely representing metastatic disease. Gallbladder and bile ducts are unremarkable. Pancreas: Unremarkable. No pancreatic ductal dilatation or surrounding inflammatory changes. Spleen: Calcified granuloma in the spleen.  No other  focal lesions. Adrenals/Urinary Tract: No adrenal gland nodules. Surgical absence of the right kidney. No mass or fluid collection in the right renal fossa. Heterogeneous low-attenuation mass in the midpole of the left kidney measures about 3.2 cm in diameter and has peripheral enhancement. This is new since the previous study. Given the short interval time of development, this likely represents an abscess. Renal cell carcinoma would be another consideration but less likely given the timing. No hydronephrosis or hydroureter. Bladder is unremarkable. Stomach/Bowel: Stomach, small bowel, and colon are mostly decompressed. Scattered stool in the colon. Sigmoid colonic diverticula without evidence of diverticulitis. Appendiceal stump. Surgical absence of the appendix. No inflammatory wall thickening present. Vascular/Lymphatic: Aortic atherosclerosis. No enlarged abdominal or pelvic lymph nodes. Reproductive: Status post hysterectomy. No adnexal masses. Other: No abdominal wall hernia or abnormality. No abdominopelvic ascites. Musculoskeletal: Degenerative changes in the spine. Postoperative right hip arthroplasty. No destructive bone lesions. IMPRESSION: 1. Fibrosis or atelectasis in the lung bases. Diffuse bronchial wall thickening suggesting chronic bronchitis. 2. 6 mm nodule in the right lung base associated with linear scarring is more prominent than previous study and could represent inflammatory process or developing metastatic nodule. 3. Multiple hypoenhancing liver lesions, largest measuring up to about 6 cm diameter. Similar to previous study. Likely metastatic disease. 4. There is a new 3.2 cm hypodense mass in the left kidney with peripheral enhancement. This is most likely to represent a renal abscess. Renal cell carcinoma is less likely due to development of the lesion in a short interval since previous study. 5. Aortic atherosclerosis. 6. Diverticulosis of the colon without evidence of diverticulitis.  Electronically Signed   By: Lucienne Capers M.D.   On: 02/14/2018 22:11    Micro Results      Recent Results (from the past 240 hour(s))  Urine culture     Status: None   Collection Time: 02/14/18  7:51 PM  Result Value Ref Range Status   Specimen Description   Final    URINE, CLEAN CATCH Performed at [facility], [place] [address], [place], [place] [address]    Special Requests   Final    NONE Performed at [facility], [place] [address], [place], [place] [address]    Culture   Final    NO GROWTH Performed at [facility],  [address], [place], [place] [address]    Report Status 02/16/2018 FINAL  Final  Culture, blood (routine x 2)     Status: None (Preliminary result)   Collection Time: 02/14/18 11:29 PM  Result Value Ref Range Status   Specimen Description   Final    BLOOD RIGHT HAND Performed at [facility] [address], [place], [place] [address]    Special Requests   Final    BOTTLES DRAWN AEROBIC ONLY Blood Culture results may not be optimal due to an inadequate volume of blood received in culture bottles Performed at [facility] [address], [place], [place] [address]    Culture   Final    NO GROWTH 3 DAYS Performed at [facility], [place] [address], [name], [place] [address]    Report Status PENDING  Incomplete  Culture, blood (routine x 2)     Status: None (Preliminary result)   Collection Time: 02/15/18  1:17 AM  Result Value Ref Range Status   Specimen Description   Final    BLOOD RIGHT HAND Performed at [facility] [address], [place], [place] [address]    Special Requests   Final    BOTTLES DRAWN AEROBIC ONLY Blood Culture adequate volume Performed at [facility] [address] [place], [place] [address]    Culture   Final    NO GROWTH 3 DAYS Performed at [facility], [place] [address], [place], [place]  [address]    Report Status PENDING  Incomplete  Aerobic/Anaerobic Culture (surgical/deep wound)     Status: None (Preliminary result)   Collection Time: 02/15/18  4:59 PM  Result Value Ref Range Status   Specimen Description NONE  Final   Special Requests RENAL  Final   Gram Stain   Final    ABUNDANT WBC PRESENT, PREDOMINANTLY PMN NO SQUAMOUS EPITHELIAL CELLS SEEN NO ORGANISMS SEEN    Culture   Final    ABUNDANT ESCHERICHIA COLI NO ANAEROBES ISOLATED; CULTURE IN PROGRESS FOR 5 DAYS    Report Status PENDING  Incomplete   Organism ID, Bacteria ESCHERICHIA COLI  Final      Susceptibility   Escherichia coli - MIC*    AMPICILLIN 4 SENSITIVE Sensitive     CEFAZOLIN <=4 SENSITIVE Sensitive     CEFEPIME <=1 SENSITIVE Sensitive     CEFTAZIDIME <=1 SENSITIVE Sensitive     CEFTRIAXONE <=1 SENSITIVE Sensitive     CIPROFLOXACIN <=0.25 SENSITIVE Sensitive     GENTAMICIN <=1 SENSITIVE Sensitive     IMIPENEM <=0.25 SENSITIVE Sensitive     TRIMETH/SULFA <=20 SENSITIVE Sensitive     AMPICILLIN/SULBACTAM <=2 SENSITIVE Sensitive     PIP/TAZO <=4 SENSITIVE Sensitive     Extended ESBL Value in next row Sensitive      NEGATIVEPerformed at [facility] [address] [place], [place] [address]    * ABUNDANT ESCHERICHIA COLI       Today   Subjective    Ariel Dimitri today has been afebrile.  no headache,no chest abdominal pain,no sob, no new weakness tingling or numbness, feels much better wants to go home today.     Objective   Blood pressure (!) 107/57, pulse 81, temperature 98.4 F (36.9 C), temperature source Oral, resp. rate 19, height 5\' 6"  (1.676 m), weight 69.6 kg (153 lb 7 oz), SpO2 100 %.   Intake/Output Summary (Last 24 hours) at 02/19/2018 4742 Last  data filed at 02/19/2018 0600 Gross per 24 hour  Intake 780 ml  Output -  Net 780 ml    Exam Awake Alert, Oriented x 3, No new F.N deficits, Normal affect [place].AT,PERRAL Supple Neck,No JVD, No cervical lymphadenopathy  appriciated.  Symmetrical Chest wall movement, Good air movement bilaterally, CTAB RRR,No Gallops,Rubs or new Murmurs, No Parasternal Heave +ve B.Sounds, Abd Soft, Non tender, No organomegaly appriciated, No rebound -guarding or rigidity. No Cyanosis, Clubbing or edema, No new Rash or bruise   Data Review   CBC w Diff:  Lab Results  Component Value Date   WBC 14.4 (H) 02/18/2018   HGB 10.0 (L) 02/18/2018   HGB 12.6 01/14/2018   HGB 13.6 10/08/2017   HCT 29.0 (L) 02/18/2018   HCT 40.2 10/08/2017   PLT 279 02/18/2018   PLT 204 01/14/2018   PLT 215 10/08/2017   LYMPHOPCT 16 02/14/2018   LYMPHOPCT 28.0 10/08/2017   MONOPCT 14 02/14/2018   MONOPCT 12.6 10/08/2017   EOSPCT 2 02/14/2018   EOSPCT 4.1 10/08/2017   BASOPCT 0 02/14/2018   BASOPCT 1.3 10/08/2017    CMP:  Lab Results  Component Value Date   NA 136 02/18/2018   NA 138 10/08/2017   K 4.1 02/18/2018   K 4.3 10/08/2017   CL 103 02/18/2018   CO2 24 02/18/2018   CO2 24 10/08/2017   BUN 17 02/18/2018   BUN 16.7 10/08/2017   CREATININE 0.95 02/18/2018   CREATININE 1.26 (H) 01/14/2018   CREATININE 1.1 10/08/2017   PROT 6.4 (L) 02/18/2018   PROT 7.6 10/08/2017   ALBUMIN 2.2 (L) 02/18/2018   ALBUMIN 3.1 (L) 10/08/2017   BILITOT 1.0 02/18/2018   BILITOT 0.7 01/14/2018   BILITOT 0.99 10/08/2017   ALKPHOS 309 (H) 02/18/2018   ALKPHOS 339 (H) 10/08/2017   AST 44 (H) 02/18/2018   AST 41 (H) 01/14/2018   AST 55 (H) 10/08/2017   ALT 26 02/18/2018   ALT 37 01/14/2018   ALT 39 10/08/2017  .   Total Time in preparing paper work, data evaluation and todays exam - 17 minutes  Jani Gravel M.D on 02/19/2018 at 6:52 AM  [facility]   Office  [phone]

## 2018-02-19 NOTE — Care Management Note (Signed)
Case Management Note  Patient Details  Name: CLEA DUBACH MRN: [id number] Date of Birth: May 24, 1928  Subjective/Objective:                  Renal abcess drained and po abx therapy at home  Action/Plan: Date: Feb 19, 2018 Chart review for discharge needs:  None found for case management. Patient has no questions concerning post hospital care. Velva Harman, BSN, [place], [place]  Expected Discharge Date:  02/18/18               Expected Discharge Plan:  Home/Self Care  In-House Referral:     Discharge planning Services  CM Consult  Post Acute Care Choice:    Choice offered to:     DME Arranged:    DME Agency:     HH Arranged:    HH Agency:     Status of Service:  Completed, signed off  If discussed at [employer] of Stay Meetings, dates discussed:    Additional Comments:  Leeroy Cha, RN 02/19/2018, 9:57 AM

## 2018-02-20 LAB — CULTURE, BLOOD (ROUTINE X 2)
CULTURE: NO GROWTH
CULTURE: NO GROWTH
Special Requests: ADEQUATE

## 2018-02-20 LAB — AEROBIC/ANAEROBIC CULTURE (SURGICAL/DEEP WOUND)

## 2018-02-20 LAB — AEROBIC/ANAEROBIC CULTURE W GRAM STAIN (SURGICAL/DEEP WOUND)

## 2018-02-22 ENCOUNTER — Other Ambulatory Visit: Payer: Self-pay | Admitting: *Deleted

## 2018-02-22 DIAGNOSIS — C3492 Malignant neoplasm of unspecified part of left bronchus or lung: Secondary | ICD-10-CM

## 2018-02-22 NOTE — Progress Notes (Signed)
Pt son called [facility] with request for Palliative care.  Order entered. Spoke with son discussed referral  Placed and to expect a call from Palliative care team. No further concerns at this time.

## 2018-02-24 ENCOUNTER — Telehealth: Payer: Self-pay | Admitting: Medical Oncology

## 2018-02-24 ENCOUNTER — Other Ambulatory Visit: Payer: Medicare Other

## 2018-02-24 NOTE — Telephone Encounter (Signed)
Calling about Palliative care -the has not heard from anyone. I called palliative and they cannot find referral . Pt referred again spoke to [place] . Son notified. Son said pt has been very weak  , nauseated from cipro and PCP called in antiemetic. Missed last appt due to UTI . Next appt 5/20.

## 2018-02-24 NOTE — Telephone Encounter (Signed)
No unless there is any urgent issues.  Thank you

## 2018-02-26 ENCOUNTER — Telehealth: Payer: Self-pay

## 2018-02-26 NOTE — Telephone Encounter (Signed)
Phone call placed to patient to offer to schedule visit with Palliative Care. Visit scheduled for Monday 03/01/18.

## 2018-02-26 NOTE — Telephone Encounter (Signed)
Called palliative care to f/u on referral after receiving VM from pt son, Richardson Landry, that he and the pt had not yet received a call from [facility]. Left VM on Palliative Care line for Lelan Pons to notify Abelina Bachelor, RN [phone] of appt and to call pt son, Richardson Landry, at [phone]. Called pt son to f/u and he had already received appt for Monday.

## 2018-02-27 ENCOUNTER — Encounter (HOSPITAL_COMMUNITY): Payer: Self-pay

## 2018-02-27 ENCOUNTER — Other Ambulatory Visit: Payer: Self-pay

## 2018-02-27 ENCOUNTER — Emergency Department (HOSPITAL_COMMUNITY): Payer: Medicare Other

## 2018-02-27 ENCOUNTER — Inpatient Hospital Stay (HOSPITAL_COMMUNITY)
Admission: EM | Admit: 2018-02-27 | Discharge: 2018-02-27 | DRG: 064 | Disposition: A | Discharge status: Home / Self Care | Payer: Medicare Other | Attending: Family Medicine | Admitting: Family Medicine

## 2018-02-27 DIAGNOSIS — Z9842 Cataract extraction status, left eye: Secondary | ICD-10-CM

## 2018-02-27 DIAGNOSIS — Z880 Allergy status to penicillin: Secondary | ICD-10-CM

## 2018-02-27 DIAGNOSIS — N151 Renal and perinephric abscess: Secondary | ICD-10-CM | POA: Diagnosis not present

## 2018-02-27 DIAGNOSIS — E785 Hyperlipidemia, unspecified: Secondary | ICD-10-CM | POA: Diagnosis not present

## 2018-02-27 DIAGNOSIS — I639 Cerebral infarction, unspecified: Secondary | ICD-10-CM | POA: Diagnosis present

## 2018-02-27 DIAGNOSIS — C7931 Secondary malignant neoplasm of brain: Secondary | ICD-10-CM | POA: Diagnosis present

## 2018-02-27 DIAGNOSIS — M19019 Primary osteoarthritis, unspecified shoulder: Secondary | ICD-10-CM | POA: Diagnosis present

## 2018-02-27 DIAGNOSIS — Z905 Acquired absence of kidney: Secondary | ICD-10-CM

## 2018-02-27 DIAGNOSIS — Z800 Family history of malignant neoplasm of digestive organs: Secondary | ICD-10-CM

## 2018-02-27 DIAGNOSIS — Z90710 Acquired absence of both cervix and uterus: Secondary | ICD-10-CM

## 2018-02-27 DIAGNOSIS — Z86711 Personal history of pulmonary embolism: Secondary | ICD-10-CM | POA: Diagnosis not present

## 2018-02-27 DIAGNOSIS — Z9841 Cataract extraction status, right eye: Secondary | ICD-10-CM

## 2018-02-27 DIAGNOSIS — Z8673 Personal history of transient ischemic attack (TIA), and cerebral infarction without residual deficits: Secondary | ICD-10-CM

## 2018-02-27 DIAGNOSIS — C3492 Malignant neoplasm of unspecified part of left bronchus or lung: Secondary | ICD-10-CM | POA: Diagnosis not present

## 2018-02-27 DIAGNOSIS — C787 Secondary malignant neoplasm of liver and intrahepatic bile duct: Secondary | ICD-10-CM | POA: Diagnosis present

## 2018-02-27 DIAGNOSIS — Z9221 Personal history of antineoplastic chemotherapy: Secondary | ICD-10-CM | POA: Diagnosis not present

## 2018-02-27 DIAGNOSIS — Z8249 Family history of ischemic heart disease and other diseases of the circulatory system: Secondary | ICD-10-CM

## 2018-02-27 DIAGNOSIS — I700 Atherosclerosis of aorta: Secondary | ICD-10-CM | POA: Diagnosis present

## 2018-02-27 DIAGNOSIS — Z96641 Presence of right artificial hip joint: Secondary | ICD-10-CM | POA: Diagnosis present

## 2018-02-27 DIAGNOSIS — Z7901 Long term (current) use of anticoagulants: Secondary | ICD-10-CM

## 2018-02-27 DIAGNOSIS — G629 Polyneuropathy, unspecified: Secondary | ICD-10-CM | POA: Diagnosis present

## 2018-02-27 DIAGNOSIS — Z888 Allergy status to other drugs, medicaments and biological substances status: Secondary | ICD-10-CM

## 2018-02-27 DIAGNOSIS — Z86010 Personal history of colonic polyps: Secondary | ICD-10-CM | POA: Diagnosis not present

## 2018-02-27 DIAGNOSIS — Z79899 Other long term (current) drug therapy: Secondary | ICD-10-CM

## 2018-02-27 DIAGNOSIS — G92 Toxic encephalopathy: Secondary | ICD-10-CM | POA: Diagnosis not present

## 2018-02-27 DIAGNOSIS — Z87891 Personal history of nicotine dependence: Secondary | ICD-10-CM

## 2018-02-27 DIAGNOSIS — Z923 Personal history of irradiation: Secondary | ICD-10-CM | POA: Diagnosis not present

## 2018-02-27 DIAGNOSIS — M21372 Foot drop, left foot: Secondary | ICD-10-CM | POA: Diagnosis present

## 2018-02-27 DIAGNOSIS — Z833 Family history of diabetes mellitus: Secondary | ICD-10-CM

## 2018-02-27 LAB — CBC WITH DIFFERENTIAL/PLATELET
BASOS ABS: 0 10*3/uL (ref 0.0–0.1)
BASOS PCT: 0 %
EOS ABS: 0.2 10*3/uL (ref 0.0–0.7)
EOS PCT: 1 %
HCT: 30.6 % — ABNORMAL LOW (ref 36.0–46.0)
Hemoglobin: 10.4 g/dL — ABNORMAL LOW (ref 12.0–15.0)
Lymphocytes Relative: 11 %
Lymphs Abs: 1.2 10*3/uL (ref 0.7–4.0)
MCH: 29.7 pg (ref 26.0–34.0)
MCHC: 34 g/dL (ref 30.0–36.0)
MCV: 87.4 fL (ref 78.0–100.0)
MONO ABS: 1.4 10*3/uL — AB (ref 0.1–1.0)
Monocytes Relative: 13 %
NEUTROS ABS: 8.2 10*3/uL — AB (ref 1.7–7.7)
Neutrophils Relative %: 75 %
PLATELETS: 195 10*3/uL (ref 150–400)
RBC: 3.5 MIL/uL — ABNORMAL LOW (ref 3.87–5.11)
RDW: 14.7 % (ref 11.5–15.5)
WBC: 10.9 10*3/uL — ABNORMAL HIGH (ref 4.0–10.5)

## 2018-02-27 LAB — COMPREHENSIVE METABOLIC PANEL
ALBUMIN: 2.4 g/dL — AB (ref 3.5–5.0)
ALT: 20 U/L (ref 14–54)
AST: 52 U/L — AB (ref 15–41)
Alkaline Phosphatase: 305 U/L — ABNORMAL HIGH (ref 38–126)
Anion gap: 10 (ref 5–15)
BUN: 13 mg/dL (ref 6–20)
CHLORIDE: 101 mmol/L (ref 101–111)
CO2: 24 mmol/L (ref 22–32)
Calcium: 8.6 mg/dL — ABNORMAL LOW (ref 8.9–10.3)
Creatinine, Ser: 1.16 mg/dL — ABNORMAL HIGH (ref 0.44–1.00)
GFR calc Af Amer: 47 mL/min — ABNORMAL LOW (ref >60)
GFR calc non Af Amer: 41 mL/min — ABNORMAL LOW (ref >60)
GLUCOSE: 98 mg/dL (ref 65–99)
POTASSIUM: 4.2 mmol/L (ref 3.5–5.1)
Sodium: 135 mmol/L (ref 135–145)
Total Bilirubin: 1.6 mg/dL — ABNORMAL HIGH (ref 0.3–1.2)
Total Protein: 6.5 g/dL (ref 6.5–8.1)

## 2018-02-27 LAB — URINALYSIS, ROUTINE W REFLEX MICROSCOPIC
Bilirubin Urine: NEGATIVE
GLUCOSE, UA: NEGATIVE mg/dL
Hgb urine dipstick: NEGATIVE
Ketones, ur: NEGATIVE mg/dL
LEUKOCYTES UA: NEGATIVE
NITRITE: NEGATIVE
PH: 6 (ref 5.0–8.0)
PROTEIN: NEGATIVE mg/dL
Specific Gravity, Urine: 1.006 (ref 1.005–1.030)

## 2018-02-27 LAB — TROPONIN I: Troponin I: 0.06 ng/mL (ref <0.03)

## 2018-02-27 LAB — LIPASE, BLOOD: LIPASE: 29 U/L (ref 11–51)

## 2018-02-27 MED ORDER — ASPIRIN 81 MG PO CHEW: 81.0000 mg | CHEWABLE_TABLET | Freq: Every day | ORAL | Status: AC

## 2018-02-27 MED ORDER — RIVAROXABAN 20 MG PO TABS
20.0000 mg | ORAL_TABLET | Freq: Once | ORAL | Status: DC
  Filled 2018-02-27: qty 1

## 2018-02-27 MED ORDER — SODIUM CHLORIDE 0.9 % IV BOLUS
1000.0000 mL | Freq: Once | INTRAVENOUS | Status: AC
  Administered 2018-02-27: 1000 mL via INTRAVENOUS

## 2018-02-27 NOTE — ED Provider Notes (Signed)
contacted by hospitalist neurologist who had a conversation with patient and the family regarding goals of care.  Patient apparently was already in the process of establishing hospice care at home.  They do not feel that admission would be beneficial to the patient.  Will discuss case with social work to expedite hospice care at home.   The patient is safe for discharge with strict return precautions.   Disposition: Discharge  Condition: Good  I have discussed the results, Dx and Tx plan with the patient and family who expressed understanding and agree(s) with the plan. Discharge instructions discussed at great length.    ED Discharge Orders        Ordered    aspirin 81 MG chewable tablet  Daily     02/27/18 1948       Follow Up: Palliative care  Call        Fatima Blank, MD 02/27/18 2345

## 2018-02-27 NOTE — Care Management Note (Addendum)
-  Case Management Note  Patient Details  Name: [name] MRN: [id number] Date of Birth: 01-24-1928  Subjective/Objective:    Pt presented to ED today for CVA.  Pt is in the process of being admitted to [facility] and has admission appointment on Monday.  Pt's son, Richardson Landry [phone]), would like for patient to go home with Hospice services tonight.    Pt has been seen by hospitalist and admitted but son states he would rather go home if hospice is available.  He requests on-call information to have in case of pt's further deterioration between now and Monday.   Pt does not need any new equipment and son feels like he can care for her if her condition does not become suddenly worse.     Action/Plan: Harmon Pier with [facility] contacted and states they are unable to do the patient's admission until Monday.  Harmon Pier will follow up with son, Richardson Landry tomorrow if they are able to see them tomorrow.  [facility] is unable to provide emergency care until pt's admission appointment on Monday.  Richardson Landry advised and understanding of plan.  Richardson Landry will discuss with patient and decide if they will be admitted or discharged.  ED provider advised.  Expected Discharge Date:                  Expected Discharge Plan:  Home/Self Care  In-House Referral:  Hospice / Palliative Care  Discharge planning Services  CM Consult  Post Acute Care Choice:  Hospice Choice offered to:  ([facility] already arranged)  DME Arranged:    DME Agency:     HH Arranged:    HH Agency:     Status of Service:  Completed, signed off  If discussed at [place] of Stay Meetings, dates discussed:    Additional Comments:  Claudie Leach, RN 02/27/2018, 6:00 PM

## 2018-02-27 NOTE — H&P (Signed)
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 -see last progress note 01/14/2018 for details of her cancer therapy--is been referred apparently to palliative care Prior CVAs January 23 with language deficit work-up at the time showed no remarkable findings and is already anticoagulated secondary to PE aspirin 325 was added, unilateral nephrectomy 1994, pulmonary embolism 2011, peripheral neuropathy aortic atherosclerosis, degenerative arthritis hip status post right hip repair 5/14  Patient was recently admitted 428-5/3 with 3.2 hypodense mass in the kidney with a renal abscess had ultrasound-guided aspiration treated with Cipro 10 days for E. coli and was sent home and is still continuing the treatment She tells me that when she was discharged from the hospital after her last stroke she was seen by her physical therapist and it was felt that she was unsteady on her feet and her primary care physician at the time determined that she should not be on aspirin and Xarelto  She had a 15-minute episode 5:11 AM wherein she felt "dizzy" as well as felt "my left I was blacking out" and she could not see temporarily-that was the only deficit she has had no seizure activity, no fall, no weakness, dark stool tarry stool fever chills or other issues at this time  She stopped taking her chemotherapy about 3 weeks prior secondary to intolerances  At baseline she is somewhat functional able to ambulate and do ADLs however she is not able to [place] or do anything differently otherwise and needs a total assistance from her son who is her most of the time at her house  ED Course: Given a sodium chloride bolus and work-up was performed  Review of Systems:   Negative for fever, visual changes, sore throat, rash, new  muscle aches, chest pain, SOB, dysuria, bleeding, n/v/abdominal pain.  Past Medical History:  Diagnosis Date  . Adenocarcinoma of left lung, stage 4 ([place]) 03/18/2017  . Arthritis    Neck,shoulder  . Brain metastases ([place]) 04/06/2017  . Colonic polyp   . Difficulty sleeping   . Encounter for antineoplastic chemotherapy 04/06/2017  . History of radiation therapy 12/04/15-12/14/15   left upper lobe 60 Gy  . Hx of unilateral nephrectomy 1994   right   . Hyperlipemia   . Lung cancer ([place])   . Numbness in feet   . Peripheral neuropathy   . Personal history of PE (pulmonary embolism) 2011    Past Surgical History:  Procedure Laterality Date  . ABDOMINAL HYSTERECTOMY    . APPENDECTOMY    . cataracts Bilateral 09/2014   removal  . CESAREAN SECTION    . NEPHRECTOMY  1993   RT -   . TOTAL HIP ARTHROPLASTY Right 02/25/2013   Procedure: RIGHT TOTAL HIP ARTHROPLASTY ANTERIOR APPROACH;  Surgeon: Mcarthur Rossetti, MD;  Location: [facility];  Service: Orthopedics;  Laterality: Right;     reports that she quit smoking about 28 years ago. Her smoking use included cigarettes. She has a 5.00 pack-year smoking history. She has never used smokeless tobacco. She reports that she does not drink alcohol or use drugs. Mobility: Ambulates independently  Allergies  Allergen Reactions  . Prednisone     Unknown to patient  . Penicillins Rash    Has patient had a PCN reaction causing immediate rash, facial/tongue/throat swelling, SOB or lightheadedness with hypotension:  Unknown Has patient had a PCN reaction causing severe rash involving mucus membranes or skin necrosis: Unknown Has patient had a PCN reaction that required hospitalization: Unknown Has patient had a PCN reaction occurring within the last 10 years: Unknown If all of the above answers are "NO", then may proceed with Cephalosporin use.     Family History  Problem Relation Age of Onset  . Hypertension Father   . Diabetes Father   .  Hypertension Mother   . Colon cancer Sister      Prior to Admission medications   Medication Sig Start Date End Date Taking? Authorizing Provider  ciprofloxacin (CIPRO) 500 MG tablet Take 1 tablet (500 mg total) by mouth 2 (two) times daily for 10 days. 02/19/18 03/01/18 Yes Jani Gravel, MD  gabapentin (NEURONTIN) 100 MG capsule Take 300 mg by mouth at bedtime.    Yes [provider]  ondansetron (ZOFRAN-ODT) 4 MG disintegrating tablet Take 4 mg by mouth 2 (two) times daily as needed for nausea/vomiting. 02/24/18  Yes [provider]  rivaroxaban (XARELTO) 20 MG TABS tablet Take 20 mg by mouth daily with supper.   Yes [provider]    Physical Exam:  Vitals:   02/27/18 1300 02/27/18 1401  BP: 130/71 128/70  Pulse: 88 88  Resp: (!) 25 18  Temp:    SpO2: 100% 100%     EOMI NCAT no pallor no icterus S1-S2 no murmur rub or gallop chest is clinically clear with no added sound extraocular movements are intact vision by direct confrontation has normalized she has no nystagmus or sick aids, tongue protrudes midline Mallampati 1 poor dentition no JVD no bruit  Power is 5/5 upper extremities bilaterally biceps triceps she has foot drop on the left side and she is slightly weaker because of a hip fracture on the right side however 5 out of 5 power overall  Reflexes are 2/3  Coordination is intact finger-nose-finger on both sides about equal I did not examine gait nor did I examined shin test  She has no drift  S1-S2 no murmur rub or gallop EKG as below  Abdomen is soft no rebound no guarding  Skin is intact without deficit rash or edema  Euthymic congruent pleasant  She is able to answer most of the questions on the MMSE and I would anticipate that she has a 3030 MMSE score  I have personally reviewed following labs and imaging studies  Labs:   BUN/creatinine 13/1.16 alk phos 305 AST ALT 52/20 [baseline creatinine is less than 1]  WBC 10 hemoglobin 10  platelet 195  Point-of-care troponin 0 0.06  Imaging studies:   CT and MRI confirms multifocal strokes embolic in phenomenon  Medical tests:   EKG independently reviewed: Patent PR interval 0.04 QRS access about 50  Test discussed with performing physician:  With Dr. Dayna Barker  Decision to obtain old records:   Reviewed  Review and summation of old records:   Reviewed extensively  Active Problems:   CVA (cerebral vascular accident) ([place])   Assessment/Plan CVA-multifocal CVAs-I am not sure what the etiology is but it could be related to the fact that patient was discontinued off of aspirin 325 after hospital admission 10/2017 for unclear reasons Patient should continue Xarelto I will defer to neurologist for further work-up Please note that the patient has end-stage lung cancer and is now off chemotherapy and I would argue that it is not going to really change her management aggressive management of the  stroke and we would not probably intervene in terms of any type of procedures like endarterectomy as if something was found  Addendum-Dr. Cheral Marker saw the patient and felt home hospice would be most appropriate and agrees with the above findings therefore we would not admit the patient and I appreciate the input from Dr. Leonette Monarch informing me of the same   Time spent: 34 minutes  Cicely Ortner, MD  [facility] Direct contact: [phone] --[place]  --[email or web address]; password [email or web address]  7PM-7AM contact night coverage as above  02/27/2018, 3:53 PM

## 2018-02-27 NOTE — ED Notes (Signed)
ED TO INPATIENT HANDOFF REPORT  Name/Age/Gender [name] 84 y.o. female  Code Status Code Status History    Date Active Date Inactive Code Status Order ID Comments User Context   02/14/2018 2353 02/19/2018 1849 Full Code [id number]  Jani Gravel, MD ED   11/12/2017 0117 11/13/2017 1626 Full Code [id number]  Jani Gravel, MD Inpatient   02/25/2013 1353 02/28/2013 1703 Full Code [id number]  Mcarthur Rossetti, MD Inpatient      Home/SNF/Other Home  Chief Complaint dehydration  Level of Care/Admitting Diagnosis ED Disposition    ED Disposition Condition [facility] Area: [place] [287867]  Level of Care: Telemetry [5]  Diagnosis: CVA (cerebral vascular accident) [facility]) [[id number]]  Admitting Physician: Nita Sells [phone]  Attending Physician: Nita Sells [phone]  Estimated length of stay: past midnight tomorrow  Certification:: I certify this patient will need inpatient services for at least 2 midnights  PT Class (Do Not Modify): Inpatient [101]  PT Acc Code (Do Not Modify): Private [1]       Medical History Past Medical History:  Diagnosis Date  . Adenocarcinoma of left lung, stage 4 ([place]) 03/18/2017  . Arthritis    Neck,shoulder  . Brain metastases ([place]) 04/06/2017  . Colonic polyp   . Difficulty sleeping   . Encounter for antineoplastic chemotherapy 04/06/2017  . History of radiation therapy 12/04/15-12/14/15   left upper lobe 60 Gy  . Hx of unilateral nephrectomy 1994   right   . Hyperlipemia   . Lung cancer ([place])   . Numbness in feet   . Peripheral neuropathy   . Personal history of PE (pulmonary embolism) 2011    Allergies Allergies  Allergen Reactions  . Prednisone     Unknown to patient  . Penicillins Rash    Has patient had a PCN reaction causing immediate rash, facial/tongue/throat swelling, SOB or lightheadedness with hypotension: Unknown Has patient had a PCN reaction causing severe rash involving mucus  membranes or skin necrosis: Unknown Has patient had a PCN reaction that required hospitalization: Unknown Has patient had a PCN reaction occurring within the last 10 years: Unknown If all of the above answers are "NO", then may proceed with Cephalosporin use.     IV Location/Drains/Wounds Patient Lines/Drains/Airways Status   Active Line/Drains/Airways    Name:   Placement date:   Placement time:   Site:   Days:   Peripheral IV 02/27/18 Left Forearm   02/27/18    0953    Forearm   less than 1          Labs/Imaging Results for orders placed or performed during the hospital encounter of 02/27/18 (from the past 48 hour(s))  CBC with Differential     Status: Abnormal   Collection Time: 02/27/18 10:40 AM  Result Value Ref Range   WBC 10.9 (H) 4.0 - 10.5 K/uL   RBC 3.50 (L) 3.87 - 5.11 MIL/uL   Hemoglobin 10.4 (L) 12.0 - 15.0 g/dL   HCT 30.6 (L) 36.0 - 46.0 %   MCV 87.4 78.0 - 100.0 fL   MCH 29.7 26.0 - 34.0 pg   MCHC 34.0 30.0 - 36.0 g/dL   RDW 14.7 11.5 - 15.5 %   Platelets 195 150 - 400 K/uL   Neutrophils Relative % 75 %   Neutro Abs 8.2 (H) 1.7 - 7.7 K/uL   Lymphocytes Relative 11 %   Lymphs Abs 1.2 0.7 - 4.0 K/uL   Monocytes Relative 13 %  Monocytes Absolute 1.4 (H) 0.1 - 1.0 K/uL   Eosinophils Relative 1 %   Eosinophils Absolute 0.2 0.0 - 0.7 K/uL   Basophils Relative 0 %   Basophils Absolute 0.0 0.0 - 0.1 K/uL    Comment: Performed at [facility], [place] [address], [place], [place] [address]  Comprehensive metabolic panel     Status: Abnormal   Collection Time: 02/27/18 10:40 AM  Result Value Ref Range   Sodium 135 135 - 145 mmol/L   Potassium 4.2 3.5 - 5.1 mmol/L   Chloride 101 101 - 111 mmol/L   CO2 24 22 - 32 mmol/L   Glucose, Bld 98 65 - 99 mg/dL   BUN 13 6 - 20 mg/dL   Creatinine, Ser 1.16 (H) 0.44 - 1.00 mg/dL   Calcium 8.6 (L) 8.9 - 10.3 mg/dL   Total Protein 6.5 6.5 - 8.1 g/dL   Albumin 2.4 (L) 3.5 - 5.0 g/dL   AST 52 (H) 15 - 41 U/L    ALT 20 14 - 54 U/L   Alkaline Phosphatase 305 (H) 38 - 126 U/L   Total Bilirubin 1.6 (H) 0.3 - 1.2 mg/dL   GFR calc non Af Amer 41 (L) >60 mL/min   GFR calc Af Amer 47 (L) >60 mL/min    Comment: (NOTE) The eGFR has been calculated using the CKD EPI equation. This calculation has not been validated in all clinical situations. eGFR's persistently <60 mL/min signify possible Chronic Kidney Disease.    Anion gap 10 5 - 15    Comment: Performed at [facility], [place] [address], [place], [place] [address]  Lipase, blood     Status: None   Collection Time: 02/27/18 10:40 AM  Result Value Ref Range   Lipase 29 11 - 51 U/L    Comment: Performed at [facility], [place] [address], [place], [place] [address]  Troponin I     Status: Abnormal   Collection Time: 02/27/18 10:40 AM  Result Value Ref Range   Troponin I 0.06 (HH) <0.03 ng/mL    Comment: CRITICAL RESULT CALLED TO, READ BACK BY AND VERIFIED WITH: ROYCE AT 1153 ON 02/27/2018 BY MOSLEY,J Performed at [facility], [place] [address], [place], [place] [address]   Urinalysis, Routine w reflex microscopic     Status: None   Collection Time: 02/27/18 10:41 AM  Result Value Ref Range   Color, Urine YELLOW YELLOW   APPearance CLEAR CLEAR   Specific Gravity, Urine 1.006 1.005 - 1.030   pH 6.0 5.0 - 8.0   Glucose, UA NEGATIVE NEGATIVE mg/dL   Hgb urine dipstick NEGATIVE NEGATIVE   Bilirubin Urine NEGATIVE NEGATIVE   Ketones, ur NEGATIVE NEGATIVE mg/dL   Protein, ur NEGATIVE NEGATIVE mg/dL   Nitrite NEGATIVE NEGATIVE   Leukocytes, UA NEGATIVE NEGATIVE    Comment: Performed at [facility] [address], [place], [place] [address]   Ct Head Wo Contrast  Result Date: 02/27/2018 CLINICAL DATA:  Weakness and dizziness.  Failure to thrive. EXAM: CT HEAD WITHOUT CONTRAST TECHNIQUE: Contiguous axial images were obtained from the base of the skull through the vertex without  intravenous contrast. COMPARISON:  MRI brain November 11, 2017 FINDINGS: Brain: No subdural, epidural, or subarachnoid hemorrhage. Cerebellum, brainstem, and basal cisterns are normal. Ventricles and sulci are unremarkable. Mild white matter changes. More focal white matter changes in the left frontal white matter not seen in January 2019. The overlying cortex is  intact. No acute cortical ischemia. No midline shift. Vascular: Calcified atherosclerosis in the intracranial carotids. Skull: Normal. Negative for fracture or focal lesion. Sinuses/Orbits: No acute finding. Other: None. IMPRESSION: 1. New focal white matter changes in the left frontal lobe with no overlying cortical involvement. No abnormality was seen in this location on the November 11, 2017 MRI. The finding is otherwise age indeterminate. This could represent an infarct within the white matter of this region. No cortical involvement. An MRI could better assess acuity. 2. No other acute abnormalities are identified. Electronically Signed   By: Dorise Bullion III M.D   On: 02/27/2018 11:20   Mr Brain Wo Contrast  Result Date: 02/27/2018 CLINICAL DATA:  84 year old female with weakness, dizziness, confusion. Additionally, history of stage IV lung cancer status post SRS treatment of a solitary left superior frontal gyrus lesion in July 2018. EXAM: MRI HEAD WITHOUT CONTRAST TECHNIQUE: Multiplanar, multiecho pulse sequences of the brain and surrounding structures were obtained without intravenous contrast. COMPARISON:  Head CT without contrast 1056 hours today. Brain MRI 11/11/2017, and earlier. FINDINGS: Brain: There are 12-14 scattered small foci of restricted diffusion in both cerebral hemispheres. These are primarily limited to the bilateral MCA territories, with cortical involvement along the bilateral middle frontal gyri, left insula, and left operculum. Two of the small foci border the bilateral PCA territories in the inferior left parietal lobe and  posterior right temporal or lateral occipital lobe (series 4, image 18). Associated mild T2 and FLAIR hyperintensity in the larger areas of involvement with no associated hemorrhage or mass effect. Since January, a confluent area of cystic encephalomalacia has developed in the left corona radiata (series 6, image 16), with no hemorrhage or mass effect. There is also a small focus of cortical encephalomalacia in the right parietal lobe which is new on series 6, image 19. The small enhancing right cerebellar lesion seen in January seems regressed on the T2 and FLAIR images today (would beyond series 6, image 6). No contrast was administered today. No new or increased cerebellar signal abnormality. The brainstem and deep gray matter nuclei remain stable. No midline shift, mass effect, ventriculomegaly, extra-axial collection or acute intracranial hemorrhage. Cervicomedullary junction and pituitary are within normal limits. Vascular: Major intracranial vascular flow voids are stable. Skull and upper cervical spine: Negative visible cervical spine. Visualized bone marrow signal is within normal limits. Sinuses/Orbits: Stable and negative. Other: Mastoid air cells remain clear. Visible internal auditory structures appear normal. Scalp and face soft tissues appear negative. IMPRESSION: 1. Progressed scattered ischemic disease in both hemispheres since January, and there are 12-14 scattered small acute or early subacute infarcts in both MCA territories. 2. No associated hemorrhage or mass effect. Substantial left corona radiata encephalomalacia has developed since January. 3. The small enhancing right cerebellar lesion seen in January seems regressed on the noncontrast images today and was likely a small infarct. 4. Overall this progressive ischemia in multiple vascular territory suggests recurrent embolic events from the heart or proximal aorta. 5. No new or recurrent metastatic disease is evident today in the absence of  contrast. Electronically Signed   By: Genevie Ann M.D.   On: 02/27/2018 14:12    Pending Labs Unresulted Labs (From admission, onward)   None      Vitals/Pain Today's Vitals   02/27/18 1200 02/27/18 1230 02/27/18 1300 02/27/18 1401  BP: 117/61 109/82 130/71 128/70  Pulse: 82 80 88 88  Resp: 20 (!) 25 (!) 25 18  Temp:  TempSrc:      SpO2: 100% 100% 100% 100%  Weight:      Height:      PainSc:        Isolation Precautions No active isolations  Medications Medications  sodium chloride 0.9 % bolus 1,000 mL (0 mLs Intravenous Stopped 02/27/18 1223)    Mobility walks

## 2018-02-27 NOTE — ED Provider Notes (Signed)
Emergency Department Provider Note   I have reviewed the triage vital signs and the nursing notes.   HISTORY  Chief Complaint Fatigue   HPI [name] is a 84 y.o. female with multiple medical problems as documented below the presents the emergency department today secondary to dizziness, fatigue and difficulty moving her eyes earlier today.  Patient and her son give the history and states that she was just in the hospital for few days where she was found to have a perinephric abscess status post IR drainage and on ciprofloxacin.  She been having decreased appetite and feeling weak since she left the hospital however initially today she was feeling okay and is actually more energetic than previously.  She bent over and went to stand up and she got really lightheaded and then she states she could not move her eyes to the right.  She states she cannot see anything on the right could not move her eyes that way.  The son states that when this happened her eyes were deviated to the left and to stay there.  He does relate on resting over the next hour this progressively improved to now where she only has dizziness.  She has no neurologic changes.  She does have residual left leg weakness from previous stroke. No other associated or modifying symptoms.    Past Medical History:  Diagnosis Date  . Adenocarcinoma of left lung, stage 4 ([place]) 03/18/2017  . Arthritis    Neck,shoulder  . Brain metastases ([place]) 04/06/2017  . Colonic polyp   . Difficulty sleeping   . Encounter for antineoplastic chemotherapy 04/06/2017  . History of radiation therapy 12/04/15-12/14/15   left upper lobe 60 Gy  . Hx of unilateral nephrectomy 1994   right   . Hyperlipemia   . Lung cancer ([place])   . Numbness in feet   . Peripheral neuropathy   . Personal history of PE (pulmonary embolism) 2011    Patient Active Problem List   Diagnosis Date Noted  . CVA (cerebral vascular accident) ([place]) 02/27/2018  .  Leukocytosis   . Aortic atherosclerosis ([place]) 02/15/2018  . Abscess of left kidney 02/14/2018  . Metastatic adenocarcinoma to brain ([place]) 11/18/2017  . UTI (urinary tract infection) 11/12/2017  . Malnutrition of moderate degree 11/12/2017  . Stroke ([place]) 11/11/2017  . Physical deconditioning 10/27/2017  . Encounter for antineoplastic chemotherapy 04/06/2017  . Goals of care, counseling/discussion 04/06/2017  . Brain metastases ([place]) 04/06/2017  . Adenocarcinoma of left lung, stage 4 ([place]) 03/18/2017  . Liver mass 03/09/2017  . Solitary pulmonary nodule 10/17/2015  . Tachycardia 06/19/2014  . Chronic anticoagulation 06/19/2014  . History of pulmonary embolism 06/19/2014  . Degenerative arthritis of hip 02/25/2013  . Hereditary and idiopathic peripheral neuropathy 01/05/2013  . Hip pain 01/05/2013    Past Surgical History:  Procedure Laterality Date  . ABDOMINAL HYSTERECTOMY    . APPENDECTOMY    . cataracts Bilateral 09/2014   removal  . CESAREAN SECTION    . NEPHRECTOMY  1993   RT -   . TOTAL HIP ARTHROPLASTY Right 02/25/2013   Procedure: RIGHT TOTAL HIP ARTHROPLASTY ANTERIOR APPROACH;  Surgeon: Mcarthur Rossetti, MD;  Location: [facility];  Service: Orthopedics;  Laterality: Right;    Current Outpatient Rx  . Order #: [id number] Class: Print  . Order #: [id number] Class: Historical Med  . Order #: [id number] Class: Historical Med  . Order #: [id number] Class: Historical Med    Allergies Prednisone and Penicillins  Family  History  Problem Relation Age of Onset  . Hypertension Father   . Diabetes Father   . Hypertension Mother   . Colon cancer Sister     Social History Social History   Tobacco Use  . Smoking status: Former Smoker    Packs/day: 0.25    Years: 20.00    Pack years: 5.00    Types: Cigarettes    Last attempt to quit: 10/20/1989    Years since quitting: 28.3  . Smokeless tobacco: Never Used  Substance Use Topics  . Alcohol use: No    Alcohol/week: 0.0  oz  . Drug use: No    Review of Systems  All other systems negative except as documented in the HPI. All pertinent positives and negatives as reviewed in the HPI. ____________________________________________   PHYSICAL EXAM:  VITAL SIGNS: ED Triage Vitals  Enc Vitals Group     BP 02/27/18 0947 125/78     Pulse Rate 02/27/18 0947 85     Resp 02/27/18 0947 16     Temp 02/27/18 0947 98.1 F (36.7 C)     Temp Source 02/27/18 0947 Oral     SpO2 02/27/18 0947 99 %     Weight 02/27/18 0952 153 lb (69.4 kg)     Height 02/27/18 0952 5\' 6"  (1.676 m)     Head Circumference --      Peak Flow --      Pain Score 02/27/18 0952 0     Pain Loc --      Pain Edu? --      Excl. in [place]? --     Constitutional: Alert and oriented. Well appearing and in no acute distress. Eyes: Conjunctivae are normal. PERRL. EOMI. Head: Atraumatic. Nose: No congestion/rhinnorhea. Mouth/Throat: Mucous membranes are moist.  Oropharynx non-erythematous. Neck: No stridor.  No meningeal signs.   Cardiovascular: Normal rate, regular rhythm. Good peripheral circulation. Grossly normal heart sounds.   Respiratory: Normal respiratory effort.  No retractions. Lungs CTAB. Gastrointestinal: Soft and nontender. No distention.  Musculoskeletal: No lower extremity tenderness nor edema. No gross deformities of extremities. Neurologic:  Normal speech and language. No gross focal neurologic deficits are appreciated. Left foot dorsiflexion is weak. Skin:  Skin is warm, dry and intact. No rash noted.  ____________________________________________   LABS (all labs ordered are listed, but only abnormal results are displayed)  Labs Reviewed  CBC WITH DIFFERENTIAL/PLATELET - Abnormal; Notable for the following components:      Result Value   WBC 10.9 (*)    RBC 3.50 (*)    Hemoglobin 10.4 (*)    HCT 30.6 (*)    Neutro Abs 8.2 (*)    Monocytes Absolute 1.4 (*)    All other components within normal limits  COMPREHENSIVE  METABOLIC PANEL - Abnormal; Notable for the following components:   Creatinine, Ser 1.16 (*)    Calcium 8.6 (*)    Albumin 2.4 (*)    AST 52 (*)    Alkaline Phosphatase 305 (*)    Total Bilirubin 1.6 (*)    GFR calc non Af Amer 41 (*)    GFR calc Af Amer 47 (*)    All other components within normal limits  TROPONIN I - Abnormal; Notable for the following components:   Troponin I 0.06 (*)    All other components within normal limits  LIPASE, BLOOD  URINALYSIS, ROUTINE W REFLEX MICROSCOPIC   ____________________________________________  EKG   EKG Interpretation  Date/Time:  Saturday Feb 27 2018  12:00:14 EDT Ventricular Rate:  80 PR Interval:    QRS Duration: 94 QT Interval:  395 QTC Calculation: 456 R Axis:   38 Text Interpretation:  Sinus rhythm Borderline repolarization abnormality No significant change since last tracing Confirmed by Merrily Pew [id number]) on 02/27/2018 12:13:32 PM       ____________________________________________  RADIOLOGY  Ct Head Wo Contrast  Result Date: 02/27/2018 CLINICAL DATA:  Weakness and dizziness.  Failure to thrive. EXAM: CT HEAD WITHOUT CONTRAST TECHNIQUE: Contiguous axial images were obtained from the base of the skull through the vertex without intravenous contrast. COMPARISON:  MRI brain November 11, 2017 FINDINGS: Brain: No subdural, epidural, or subarachnoid hemorrhage. Cerebellum, brainstem, and basal cisterns are normal. Ventricles and sulci are unremarkable. Mild white matter changes. More focal white matter changes in the left frontal white matter not seen in January 2019. The overlying cortex is intact. No acute cortical ischemia. No midline shift. Vascular: Calcified atherosclerosis in the intracranial carotids. Skull: Normal. Negative for fracture or focal lesion. Sinuses/Orbits: No acute finding. Other: None. IMPRESSION: 1. New focal white matter changes in the left frontal lobe with no overlying cortical involvement. No abnormality  was seen in this location on the November 11, 2017 MRI. The finding is otherwise age indeterminate. This could represent an infarct within the white matter of this region. No cortical involvement. An MRI could better assess acuity. 2. No other acute abnormalities are identified. Electronically Signed   By: Dorise Bullion III M.D   On: 02/27/2018 11:20   Mr Brain Wo Contrast  Result Date: 02/27/2018 CLINICAL DATA:  84 year old female with weakness, dizziness, confusion. Additionally, history of stage IV lung cancer status post SRS treatment of a solitary left superior frontal gyrus lesion in July 2018. EXAM: MRI HEAD WITHOUT CONTRAST TECHNIQUE: Multiplanar, multiecho pulse sequences of the brain and surrounding structures were obtained without intravenous contrast. COMPARISON:  Head CT without contrast 1056 hours today. Brain MRI 11/11/2017, and earlier. FINDINGS: Brain: There are 12-14 scattered small foci of restricted diffusion in both cerebral hemispheres. These are primarily limited to the bilateral MCA territories, with cortical involvement along the bilateral middle frontal gyri, left insula, and left operculum. Two of the small foci border the bilateral PCA territories in the inferior left parietal lobe and posterior right temporal or lateral occipital lobe (series 4, image 18). Associated mild T2 and FLAIR hyperintensity in the larger areas of involvement with no associated hemorrhage or mass effect. Since January, a confluent area of cystic encephalomalacia has developed in the left corona radiata (series 6, image 16), with no hemorrhage or mass effect. There is also a small focus of cortical encephalomalacia in the right parietal lobe which is new on series 6, image 19. The small enhancing right cerebellar lesion seen in January seems regressed on the T2 and FLAIR images today (would beyond series 6, image 6). No contrast was administered today. No new or increased cerebellar signal abnormality. The  brainstem and deep gray matter nuclei remain stable. No midline shift, mass effect, ventriculomegaly, extra-axial collection or acute intracranial hemorrhage. Cervicomedullary junction and pituitary are within normal limits. Vascular: Major intracranial vascular flow voids are stable. Skull and upper cervical spine: Negative visible cervical spine. Visualized bone marrow signal is within normal limits. Sinuses/Orbits: Stable and negative. Other: Mastoid air cells remain clear. Visible internal auditory structures appear normal. Scalp and face soft tissues appear negative. IMPRESSION: 1. Progressed scattered ischemic disease in both hemispheres since January, and there are 12-14 scattered small acute  or early subacute infarcts in both MCA territories. 2. No associated hemorrhage or mass effect. Substantial left corona radiata encephalomalacia has developed since January. 3. The small enhancing right cerebellar lesion seen in January seems regressed on the noncontrast images today and was likely a small infarct. 4. Overall this progressive ischemia in multiple vascular territory suggests recurrent embolic events from the heart or proximal aorta. 5. No new or recurrent metastatic disease is evident today in the absence of contrast. Electronically Signed   By: Genevie Ann M.D.   On: 02/27/2018 14:12    ____________________________________________   PROCEDURES  Procedure(s) performed:   Procedures   ____________________________________________   INITIAL IMPRESSION / ASSESSMENT AND PLAN / ED COURSE  eval for persistent UTI vs stroke vs dehydration.   Abnormal CT scans and MRI performed shows multiple new infarcts in bilateral hemispheres since January. Considered tPA but symptoms resolved and unknown onset, also on xarelto. Discussed this with Dr. Cheral Marker with neurology who recommends admission to [facility] for further work-up of stroke.  Also discussed with palliative care and he was seen in the hospital  regarding her wishes to possibly pursue palliative care. Discussed with Dr. Shauna Hugh with medicine who will admit to hospital.   Pertinent labs & imaging results that were available during my care of the patient were reviewed by me and considered in my medical decision making (see chart for details).  ____________________________________________  FINAL CLINICAL IMPRESSION(S) / ED DIAGNOSES  Final diagnoses:  Cerebrovascular accident (CVA), unspecified mechanism ([place])     MEDICATIONS GIVEN DURING THIS VISIT:  Medications  sodium chloride 0.9 % bolus 1,000 mL (0 mLs Intravenous Stopped 02/27/18 1223)     NEW OUTPATIENT MEDICATIONS STARTED DURING THIS VISIT:  Current Discharge Medication List      Note:  This note was prepared with assistance of Dragon voice recognition software. Occasional wrong-word or sound-a-like substitutions may have occurred due to the inherent limitations of voice recognition software.   Merrily Pew, MD 02/27/18 1616

## 2018-02-27 NOTE — ED Triage Notes (Signed)
EMS reports from home,recently Dx with a UTI a week ago, weakness and dizziness since this morning. Failure to thrive, Pt states "havent had a decent meal in a long time".  BP 125/62 Hr 90 Reap 18 Sp02 96 RA CBG 94  20 L forearm  791ml Lactated Ringers enroute

## 2018-03-01 ENCOUNTER — Ambulatory Visit: Payer: Medicare Other | Admitting: Radiation Oncology

## 2018-03-01 ENCOUNTER — Telehealth: Payer: Self-pay | Admitting: Medical Oncology

## 2018-03-01 ENCOUNTER — Other Ambulatory Visit: Payer: Self-pay | Admitting: Internal Medicine

## 2018-03-01 DIAGNOSIS — Z7189 Other specified counseling: Secondary | ICD-10-CM

## 2018-03-01 NOTE — Telephone Encounter (Signed)
Palliative care referral scheduled visit today,howvever, pt is in the hospital and theres is discussion of Hospice .

## 2018-03-03 ENCOUNTER — Encounter: Payer: Self-pay | Admitting: Internal Medicine

## 2018-03-03 NOTE — Progress Notes (Signed)
03/01/2018  PALLIATIVE CARE CONSULT [name] DOB:  01/16/1918. [address] [place], [place] [address] [phone] REFERRING PROVIDER: Dr. Maia Breslow (contact Diane [phone]) last OV 01/14/18. PCP Dr.Rupashree Fara Olden   [facility] patient POA: [name] [phone]  BILLABLE  ICD-10: Weakness IMPRESSION/RECOMMENDATIONS: 1. Transient visual field changes from recent MCA (prob embolic) stroke; symptoms continued resolved since 02/27/2018; at her baseline. 2.  Constipation. LBM 4 days ago. Baseline pattern qd to qod. Typical regimen are prunes/ prn MOM;   a. restart MOM 30-67m; mg qd to bid; titrate to response 3. UTI: Completing course of Cipro tomorrow. MS changes typically associated with UTIs resolved. 4. Weakness: Progressive weakness since late Janurary when suffered a stroke. Previouse use of cane but now needs walker; wheelchair for outings. No falls for 5 months. Needs assistance for bathing; mostly able to dress herself. Has had PT (can't find their exercise list)/ST in early March. Ambulates about home to exercise.   a. Discussed getting back to the exercise regimen left by [facility] (Kasey/Chris). I will check with them  to see if they can e-mail those to the family ([email or web address]).  Ask Dr. Harlon Flor office if they could send another referral to resume PT after this recent event;  I am not sure about insurance coverage but [facility] could probably sort that out. 5. Poor appetite, though improved. Previously just eating bites, now 75% of 3 small meals/d.  Alb 2.4 (5/15) Wt 147.5 lbs; loss of 5.5 lbs over last month or so, down22.5 lbs over previous 2 years. Ht  5'6", BMI 23.8. Occasional nausea when taking Gilotrif (currently on hold for almost 3 weeks).  6. Bilateral LE swelling over the last 6 months; dependent in nature. Discussed probable multifactorial causes (low serum albumin, sedentary, force of gravity). 7. Follow up:  a.LCSW consult; counseling setting  stress of terminal illness  a. NP visit in 2 weeks. 8. HTN: BP meds continue to be held; home measurements fine; rangng 93/61 to more rarely 132/65. 9. Advanced Care Planning:  DNR/MOST form reviewed/completed: DNR/DNI. If hospitalized: limited scope of medical intervention. We did discuss scope of hospice care services, and their criteria for admission (forgoing further chemotherapy/PT for example).  I spent 75 minutes providing this consultation (from 1pm to 2:15pm). More than 50% of that time was spent coordinating communication.  HPI: Very nice 84 yo AA female, with h/o recurrent, metastatic (liver/brain) non-small cell lung cancer, current treatment with Gilotrif. She is s/p ER visit 02/27/2018 with symptoms of visual field changes. Head CT: MCA stroke. Symptoms resolved and patient discharge from the ER as neither her or her son wished her to be hospitalized. She and her son are considering the risks vs burdens of continuing ongoing chemotherapy with Gilotrif (on hold for last 3 weeks). She has tolerated this med in the past but recently has had symptoms of nausea and weakness that she and her son are not certain are related to her recent UTI / strokes, or to the Gilotrif. She plans to resource Dr. Earlie Server (with guidance from f/u scans) to help sort this out. She is scheduled to see him May 20th. Dr. Mckinley Jewel has referred her to Palliative Care for assistance with symptom management, help with coordination of resources, and ongoing discussions regarding goals of care/advanced directives  CODE STATUS: DNR PPS: 50%.  HOSPICE ELIGIBILITY: yes, if defers further chemotherapy/PT MEDICATIONS :  Gilotrif (on hold), Cipro bid x 10d  (? Completed), Methylprednisolone 4mg  dose pack (completed), prostat 30mg  bid,  gabapentin 100mg  3 caps qhs, metoprolol 25mg   tab qd (on hold), rivaroxaban (Xarelto) 20mg  qd. asa 81mg  qd. Ondansetron ODT 4mg  (not taking for last few day) ALLERGIES: Penicillins (rash) PAST  MEDICAL HISTORY: Adenocarcinoma left lung stage 4 dx 02/2016 (brain/liver metastases; chemo/radiation), Arthritis (neck/shoulder), insomnia, unilateral nephrectomy, hyperlipemia, peripheral neuropathy, pulmonary embolism, moderate malnutrition, UTI, left kidney abscess, h/o R nephrectomy, R hip replacement. 02/27/18: ER visit CVA; right visual field changes now at baseline. Heat CT MCA multi strokes (?embolic) 40/08-67/61/95: hosp adm: left renal abscess (drained/antbx Meropenem), dizziness,   PHYSICAL EXAM: Pleasantly conversant AA female; progressively fatigued during visit and needed to lay down. VS: VS: BP 120/70, 98% sats room air, HR 83 HEENT: [place], AT LUNGS:  Lung fields clear bilaterally CARDIAC: RRR without MRG ABD: soft, non-distended. NABS EXTREMITIES: bilateral LE swelling to low calf; non-pitting MUSCULOSKELETAL: No contractures SKIN: Warm, dry and intact NEURO: A & O X 3  Violeta Gelinas, NP-C

## 2018-03-05 ENCOUNTER — Other Ambulatory Visit: Payer: Medicare Other

## 2018-03-08 ENCOUNTER — Inpatient Hospital Stay: Payer: Medicare Other | Admitting: Oncology

## 2018-03-08 ENCOUNTER — Inpatient Hospital Stay: Payer: Medicare Other

## 2018-03-08 ENCOUNTER — Ambulatory Visit: Admission: RE | Admit: 2018-03-08 | Payer: Medicare Other | Source: Ambulatory Visit | Admitting: Radiation Oncology

## 2018-03-08 ENCOUNTER — Telehealth: Payer: Self-pay | Admitting: Medical Oncology

## 2018-03-08 NOTE — Telephone Encounter (Signed)
Pt at home on palliative care. Had recent stroke, UTI,completed antibiotics.  At home but too weak to come in today. She is not taking GIlotrif. Had recent brain scans MRI-multifocal strokes, no mets. Son wants to keep her on palliative care if she is going to continue gilotrif.  Does Mohamed want her to continue Gilotrif? I told him we need to reschedule her appt with Julien Nordmann to discuss.

## 2018-03-09 ENCOUNTER — Telehealth: Payer: Self-pay | Admitting: Oncology

## 2018-03-09 NOTE — Telephone Encounter (Signed)
Scheduled appt per 5/20 sch message - left message for pt son with appt date and time.

## 2018-03-13 ENCOUNTER — Emergency Department (HOSPITAL_COMMUNITY)
Admission: EM | Admit: 2018-03-13 | Discharge: 2018-03-14 | Disposition: A | Discharge status: Home / Self Care | Payer: Medicare Other | Attending: Emergency Medicine | Admitting: Emergency Medicine

## 2018-03-13 ENCOUNTER — Encounter (HOSPITAL_COMMUNITY): Payer: Self-pay | Admitting: *Deleted

## 2018-03-13 ENCOUNTER — Other Ambulatory Visit: Payer: Self-pay

## 2018-03-13 DIAGNOSIS — G893 Neoplasm related pain (acute) (chronic): Secondary | ICD-10-CM | POA: Diagnosis present

## 2018-03-13 DIAGNOSIS — Z79899 Other long term (current) drug therapy: Secondary | ICD-10-CM | POA: Diagnosis not present

## 2018-03-13 DIAGNOSIS — Z87891 Personal history of nicotine dependence: Secondary | ICD-10-CM | POA: Diagnosis not present

## 2018-03-13 DIAGNOSIS — Z96641 Presence of right artificial hip joint: Secondary | ICD-10-CM | POA: Diagnosis not present

## 2018-03-13 DIAGNOSIS — Z7982 Long term (current) use of aspirin: Secondary | ICD-10-CM | POA: Diagnosis not present

## 2018-03-13 DIAGNOSIS — C3490 Malignant neoplasm of unspecified part of unspecified bronchus or lung: Secondary | ICD-10-CM | POA: Diagnosis not present

## 2018-03-13 DIAGNOSIS — Z7901 Long term (current) use of anticoagulants: Secondary | ICD-10-CM | POA: Diagnosis not present

## 2018-03-13 LAB — CBC
HEMATOCRIT: 32.2 % — AB (ref 36.0–46.0)
Hemoglobin: 10.9 g/dL — ABNORMAL LOW (ref 12.0–15.0)
MCH: 28.9 pg (ref 26.0–34.0)
MCHC: 33.9 g/dL (ref 30.0–36.0)
MCV: 85.4 fL (ref 78.0–100.0)
Platelets: 231 10*3/uL (ref 150–400)
RBC: 3.77 MIL/uL — ABNORMAL LOW (ref 3.87–5.11)
RDW: 15.5 % (ref 11.5–15.5)
WBC: 16.8 10*3/uL — AB (ref 4.0–10.5)

## 2018-03-13 LAB — COMPREHENSIVE METABOLIC PANEL
ALT: 27 U/L (ref 14–54)
ANION GAP: 12 (ref 5–15)
AST: 107 U/L — AB (ref 15–41)
Albumin: 2.9 g/dL — ABNORMAL LOW (ref 3.5–5.0)
Alkaline Phosphatase: 371 U/L — ABNORMAL HIGH (ref 38–126)
BILIRUBIN TOTAL: 2.4 mg/dL — AB (ref 0.3–1.2)
BUN: 13 mg/dL (ref 6–20)
CO2: 23 mmol/L (ref 22–32)
Calcium: 8.9 mg/dL (ref 8.9–10.3)
Chloride: 100 mmol/L — ABNORMAL LOW (ref 101–111)
Creatinine, Ser: 0.99 mg/dL (ref 0.44–1.00)
GFR calc Af Amer: 57 mL/min — ABNORMAL LOW (ref >60)
GFR, EST NON AFRICAN AMERICAN: 49 mL/min — AB (ref >60)
Glucose, Bld: 91 mg/dL (ref 65–99)
POTASSIUM: 4.2 mmol/L (ref 3.5–5.1)
Sodium: 135 mmol/L (ref 135–145)
TOTAL PROTEIN: 7.4 g/dL (ref 6.5–8.1)

## 2018-03-13 LAB — I-STAT CG4 LACTIC ACID, ED: Lactic Acid, Venous: 1.59 mmol/L (ref 0.5–1.9)

## 2018-03-13 LAB — LIPASE, BLOOD: Lipase: 26 U/L (ref 11–51)

## 2018-03-13 MED ORDER — ACETAMINOPHEN 325 MG PO TABS
650.0000 mg | ORAL_TABLET | Freq: Once | ORAL | Status: AC
  Administered 2018-03-13: 650 mg via ORAL
  Filled 2018-03-13: qty 2

## 2018-03-13 MED ORDER — SODIUM CHLORIDE 0.9 % IV BOLUS
1000.0000 mL | Freq: Once | INTRAVENOUS | Status: AC
  Administered 2018-03-13: 1000 mL via INTRAVENOUS

## 2018-03-13 MED ORDER — OXYCODONE-ACETAMINOPHEN 5-325 MG PO TABS: 1.0000 | ORAL_TABLET | Freq: Four times a day (QID) | ORAL | 0 refills | Status: DC | PRN

## 2018-03-13 MED ORDER — SODIUM CHLORIDE 0.9 % IV BOLUS
500.0000 mL | Freq: Once | INTRAVENOUS | Status: AC
  Administered 2018-03-13: 500 mL via INTRAVENOUS

## 2018-03-13 MED ORDER — OXYCODONE-ACETAMINOPHEN 5-325 MG PO TABS
1.0000 | ORAL_TABLET | Freq: Once | ORAL | Status: AC
  Administered 2018-03-13: 1 via ORAL
  Filled 2018-03-13: qty 1

## 2018-03-13 NOTE — Discharge Instructions (Addendum)
You are seen today with pain likely stemming from your cancer.  We have given you some oral pain medications to help with this.  We talked to [facility] and they will try to see you tomorrow.  They will call your son.  Your urine showed  no sign of infection.

## 2018-03-13 NOTE — ED Provider Notes (Signed)
[facility] DEPT Provider Note   CSN: [id number] Arrival date & time: 03/13/18  2022     History   Chief Complaint Chief Complaint  Patient presents with  . Abdominal Pain    HPI [name] is a 84 y.o. female.  HPI  Patient is a 84 year old female with stage IV cancer, currently looking for hospice.  According to family patient was unable to get hospice because of the long weekend and looking to get into it on Tuesday.  She is here for pain control.  Have offered patient and family multiple different levels of care including home with oral medications, IV here, and further work-up for source of fever, likely sepsis, and dehydration.  Patient family are curious whether she has a urinary tract infection, otherwise does not want chest x-ray, or further work-up including does not want a CAT scan.  Will touch base with hospice and palliative care of [facility] to see if they are able to have involvement of palliative care prior to Tuesday.  Past Medical History:  Diagnosis Date  . Adenocarcinoma of left lung, stage 4 ([place]) 03/18/2017  . Arthritis    Neck,shoulder  . Brain metastases ([place]) 04/06/2017  . Colonic polyp   . Difficulty sleeping   . Encounter for antineoplastic chemotherapy 04/06/2017  . History of radiation therapy 12/04/15-12/14/15   left upper lobe 60 Gy  . Hx of unilateral nephrectomy 1994   right   . Hyperlipemia   . Lung cancer ([place])   . Numbness in feet   . Peripheral neuropathy   . Personal history of PE (pulmonary embolism) 2011    Patient Active Problem List   Diagnosis Date Noted  . CVA (cerebral vascular accident) ([place]) 02/27/2018  . Leukocytosis   . Aortic atherosclerosis ([place]) 02/15/2018  . Abscess of left kidney 02/14/2018  . Metastatic adenocarcinoma to brain ([place]) 11/18/2017  . UTI (urinary tract infection) 11/12/2017  . Malnutrition of moderate degree 11/12/2017  . Stroke ([place]) 11/11/2017  . Physical  deconditioning 10/27/2017  . Encounter for antineoplastic chemotherapy 04/06/2017  . Goals of care, counseling/discussion 04/06/2017  . Brain metastases ([place]) 04/06/2017  . Adenocarcinoma of left lung, stage 4 ([place]) 03/18/2017  . Liver mass 03/09/2017  . Solitary pulmonary nodule 10/17/2015  . Tachycardia 06/19/2014  . Chronic anticoagulation 06/19/2014  . History of pulmonary embolism 06/19/2014  . Degenerative arthritis of hip 02/25/2013  . Hereditary and idiopathic peripheral neuropathy 01/05/2013  . Hip pain 01/05/2013    Past Surgical History:  Procedure Laterality Date  . ABDOMINAL HYSTERECTOMY    . APPENDECTOMY    . cataracts Bilateral 09/2014   removal  . CESAREAN SECTION    . NEPHRECTOMY  1993   RT -   . TOTAL HIP ARTHROPLASTY Right 02/25/2013   Procedure: RIGHT TOTAL HIP ARTHROPLASTY ANTERIOR APPROACH;  Surgeon: Mcarthur Rossetti, MD;  Location: [facility];  Service: Orthopedics;  Laterality: Right;     OB History   None      Home Medications    Prior to Admission medications   Medication Sig Start Date End Date Taking? Authorizing Provider  aspirin 81 MG chewable tablet Chew 1 tablet (81 mg total) by mouth daily. 02/27/18   Fatima Blank, MD  gabapentin (NEURONTIN) 100 MG capsule Take 300 mg by mouth at bedtime.     [provider]  ondansetron (ZOFRAN-ODT) 4 MG disintegrating tablet Take 4 mg by mouth 2 (two) times daily as needed for nausea/vomiting.  02/24/18   [provider]  rivaroxaban (XARELTO) 20 MG TABS tablet Take 20 mg by mouth daily with supper.    [provider]    Family History Family History  Problem Relation Age of Onset  . Hypertension Father   . Diabetes Father   . Hypertension Mother   . Colon cancer Sister     Social History Social History   Tobacco Use  . Smoking status: Former Smoker    Packs/day: 0.25    Years: 20.00    Pack years: 5.00    Types: Cigarettes    Last attempt to quit:  10/20/1989    Years since quitting: 28.4  . Smokeless tobacco: Never Used  Substance Use Topics  . Alcohol use: No    Alcohol/week: 0.0 oz  . Drug use: No     Allergies   Prednisone and Penicillins   Review of Systems Review of Systems  Constitutional: Positive for activity change, appetite change, fatigue and fever.  Respiratory: Negative for shortness of breath.   Cardiovascular: Negative for chest pain.  Gastrointestinal: Positive for abdominal pain and nausea.  All other systems reviewed and are negative.    Physical Exam Updated Vital Signs BP 140/65   Pulse (!) 102   Temp 98.7 F (37.1 C) (Oral)   Resp 17   SpO2 98%   Physical Exam  Constitutional: She is oriented to person, place, and time. She appears well-developed and well-nourished.  Diffusely jaundiced.  HENT:  Head: Normocephalic and atraumatic.  Eyes: Conjunctivae are normal. Right eye exhibits no discharge.  Neck: Neck supple.  Cardiovascular: Regular rhythm and normal heart sounds.  No murmur heard. Tachycardic.  Pulmonary/Chest: Effort normal and breath sounds normal. She has no wheezes. She has no rales.  Abdominal: Soft. She exhibits distension. There is tenderness.  Musculoskeletal: Normal range of motion. She exhibits no edema.  Neurological: She is oriented to person, place, and time. No cranial nerve deficit.  Skin: Skin is warm and dry. No rash noted.  Psychiatric: She has a normal mood and affect. Her behavior is normal.  Nursing note and vitals reviewed.    ED Treatments / Results  Labs (all labs ordered are listed, but only abnormal results are displayed) Labs Reviewed  COMPREHENSIVE METABOLIC PANEL - Abnormal; Notable for the following components:      Result Value   Chloride 100 (*)    Albumin 2.9 (*)    AST 107 (*)    Alkaline Phosphatase 371 (*)    Total Bilirubin 2.4 (*)    GFR calc non Af Amer 49 (*)    GFR calc Af Amer 57 (*)    All other components within normal limits   CBC - Abnormal; Notable for the following components:   WBC 16.8 (*)    RBC 3.77 (*)    Hemoglobin 10.9 (*)    HCT 32.2 (*)    All other components within normal limits  URINE CULTURE  LIPASE, BLOOD  URINALYSIS, ROUTINE W REFLEX MICROSCOPIC  I-STAT CG4 LACTIC ACID, ED    EKG None  Radiology No results found.  Procedures Procedures (including critical care time)  Medications Ordered in ED Medications  oxyCODONE-acetaminophen (PERCOCET/ROXICET) 5-325 MG per tablet 1 tablet (1 tablet Oral Given 03/13/18 2146)  acetaminophen (TYLENOL) tablet 650 mg (650 mg Oral Given 03/13/18 2146)  sodium chloride 0.9 % bolus 1,000 mL (1,000 mLs Intravenous New Bag/Given 03/13/18 2151)     Initial Impression / Assessment and Plan /  ED Course  I have reviewed the triage vital signs and the nursing notes.  Pertinent labs & imaging results that were available during my care of the patient were reviewed by me and considered in my medical decision making (see chart for details).     Patient is a 84 year old female with stage IV cancer, currently looking for hospice.  According to family patient was unable to get hospice because of the long weekend and looking to get into it on Tuesday.  She is here for pain control.  Have offered patient and family multiple different levels of care including home with oral medications, IV here, and further work-up for source of fever, likely sepsis, and dehydration.  Patient family are curious whether she has a urinary tract infection, otherwise does not want chest x-ray, or further work-up including does not want a CAT scan.  Will touch base with hospice and palliative care of [facility] to see if they are able to have involvement of palliative care prior to Tuesday  9:46 PM Discussed with hospice, and gave them information for patient's son, they said they will likely be able to start caring for patient starting tomorrow.  11:20 PM Patient very comfortable  after percocet.  Awaiting urine.   Will plan to DC with oral pain medications and follow up with hospice and PCP.    Final Clinical Impressions(s) / ED Diagnoses   Final diagnoses:  None    ED Discharge Orders    None       Macarthur Critchley, MD 03/13/18 2321

## 2018-03-13 NOTE — ED Triage Notes (Signed)
Pt bib EMS and coming from home and presents with lower abd pain x 3-4 days.  Pt denies n/v. Pt reports constipation issues but had a small bowel movement today.

## 2018-03-13 NOTE — ED Notes (Signed)
Pt. States she does not want a rectal temp.

## 2018-03-13 NOTE — ED Notes (Signed)
Pt states they cannot provide urine specimen at this time.

## 2018-03-13 NOTE — ED Notes (Signed)
Pt declined to have a rectal temperature.

## 2018-03-13 NOTE — ED Notes (Signed)
Pt's son, Richardson Landry, [phone].

## 2018-03-13 NOTE — ED Notes (Signed)
Bed: WT21 Expected date:  Expected time:  Means of arrival:  Comments: EMS 84 yo female lower abd pain x 3-4 days/N&V-constipated

## 2018-03-14 LAB — URINALYSIS, ROUTINE W REFLEX MICROSCOPIC
BILIRUBIN URINE: NEGATIVE
GLUCOSE, UA: NEGATIVE mg/dL
KETONES UR: 20 mg/dL — AB
LEUKOCYTES UA: NEGATIVE
NITRITE: NEGATIVE
PH: 5 (ref 5.0–8.0)
Protein, ur: NEGATIVE mg/dL
SPECIFIC GRAVITY, URINE: 1.011 (ref 1.005–1.030)

## 2018-03-14 MED ORDER — SODIUM CHLORIDE 0.9 % IV BOLUS
1000.0000 mL | Freq: Once | INTRAVENOUS | Status: AC
  Administered 2018-03-14: 1000 mL via INTRAVENOUS

## 2018-03-14 NOTE — ED Provider Notes (Signed)
Care assumed from Dr. Thomasene Lot, patient with pain from terminal cancer, pending urinalysis to see if she needs to be on antibiotics.  She received 2 L of fluid in the ED, and eventually was able to give a urine sample which showed no evidence of infection.  She is discharged with prescription for oxycodone-acetaminophen, and hospice will be seeing her later today.   Delora Fuel, MD 24/46/95 [phone]

## 2018-03-14 NOTE — ED Notes (Signed)
Pt had one episode of n/v.

## 2018-03-15 LAB — URINE CULTURE

## 2018-03-16 ENCOUNTER — Telehealth: Payer: Self-pay | Admitting: Medical Oncology

## 2018-03-16 NOTE — Telephone Encounter (Signed)
  Pt referred to [facility] . Called back and told nurse that  [name] will be attending.

## 2018-03-25 ENCOUNTER — Other Ambulatory Visit: Payer: Self-pay | Admitting: Medical Oncology

## 2018-03-25 ENCOUNTER — Telehealth: Payer: Self-pay | Admitting: Medical Oncology

## 2018-03-25 DIAGNOSIS — R630 Anorexia: Secondary | ICD-10-CM

## 2018-03-25 NOTE — Telephone Encounter (Addendum)
Nausea not eating. Got dizzy and could not complete her bath. Requests appetite stimulant. PT allergic to prednisone. I asked Hospice RN to find out what reaction she has.

## 2018-03-26 MED ORDER — METHYLPREDNISOLONE 4 MG PO TBPK: ORAL_TABLET | ORAL | 0 refills | Status: AC

## 2018-03-26 NOTE — Addendum Note (Signed)
Addended by: Ardeen Garland on: 03/26/2018 10:24 AM   Modules accepted: Orders

## 2018-03-26 NOTE — Telephone Encounter (Signed)
[name] stated pts son told her the pt is not allergic to prednisone that he knows of . She is allergic only to Penicillin. Called order for medrol dose pack to [name] and she will call it to the hospice pharmacy.

## 2018-03-30 ENCOUNTER — Inpatient Hospital Stay: Payer: Medicare Other | Admitting: Oncology

## 2018-03-30 ENCOUNTER — Inpatient Hospital Stay: Payer: Medicare Other

## 2018-04-09 ENCOUNTER — Telehealth: Payer: Self-pay | Admitting: Medical Oncology

## 2018-04-09 DIAGNOSIS — B370 Candidal stomatitis: Secondary | ICD-10-CM

## 2018-04-09 MED ORDER — NYSTATIN 100000 UNIT/ML MT SUSP: 5.0000 mL | Freq: Four times a day (QID) | OROMUCOSAL | 0 refills | Status: AC

## 2018-04-09 NOTE — Telephone Encounter (Signed)
Nurse reports pt has thrush. Per Cyril Mourning I called in nystatin.

## 2018-04-12 ENCOUNTER — Telehealth: Payer: Self-pay | Admitting: *Deleted

## 2018-04-12 NOTE — Telephone Encounter (Signed)
Received vm call from Nancy/RN/Hospice asking for refill on percocet.  Call back # is [phone].  Message routed to Dr Mohamed/Pod RN

## 2018-04-13 ENCOUNTER — Other Ambulatory Visit: Payer: Self-pay | Admitting: Medical Oncology

## 2018-04-13 DIAGNOSIS — C3492 Malignant neoplasm of unspecified part of left bronchus or lung: Secondary | ICD-10-CM

## 2018-04-13 MED ORDER — OXYCODONE-ACETAMINOPHEN 5-325 MG PO TABS: 1.0000 | ORAL_TABLET | Freq: Four times a day (QID) | ORAL | 0 refills | Status: AC | PRN

## 2018-04-14 ENCOUNTER — Telehealth: Payer: Self-pay | Admitting: Medical Oncology

## 2018-04-14 NOTE — Telephone Encounter (Signed)
Can pt be switched from Xarelto to coumadin . [facility] can do INR and call results to [facility]. Message to [place].

## 2018-04-14 NOTE — Progress Notes (Signed)
PA for oxycodone-acetaminophen 5-325 mg tablets has been submitted.

## 2018-04-14 NOTE — Telephone Encounter (Signed)
Can pt be switched to coumadin from xarelto?  PE diagnosed in 2011.

## 2018-04-15 NOTE — Telephone Encounter (Signed)
Left message on sons VM that pain med is ready at pharmacy.

## 2018-04-15 NOTE — Telephone Encounter (Signed)
Notified RN Lovey Newcomer.

## 2018-04-15 NOTE — Telephone Encounter (Signed)
No stay on Xarelto. Coumadin will need very close monitoring which is not good in her condition. I did not start her on Anticoagulation. She may need to consult with the ordering physician. Thank you.

## 2018-04-28 ENCOUNTER — Telehealth: Payer: Self-pay | Admitting: Medical Oncology

## 2018-04-28 NOTE — Telephone Encounter (Signed)
P died 2018-05-09

## 2018-04-28 NOTE — Telephone Encounter (Signed)
Expressed the staff  condolences to son .

## 2018-05-20 DEATH — deceased

## 2019-01-16 IMAGING — CT CT HEAD W/O CM
3 series · 14 of 47 positions shown, 16 images · non-contrast
Comparison: MRI brain November 11, 2017

CLINICAL DATA: Weakness and dizziness.  Failure to thrive.

EXAM:
CT HEAD WITHOUT CONTRAST
TECHNIQUE: Contiguous axial images were obtained from the base of the skull
through the vertex without intravenous contrast.

[Series 2: head wo · axial · 0.47mm/px · z∈[-149,-24]mm · 8 of 31 slices shown, 10 images]
[im 3/31  brain]
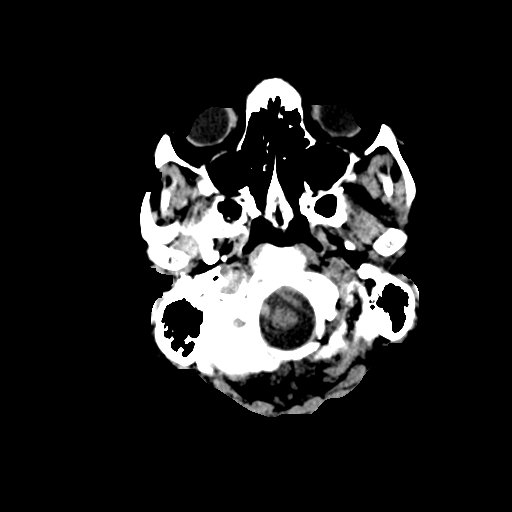
[im 3/31  bone]
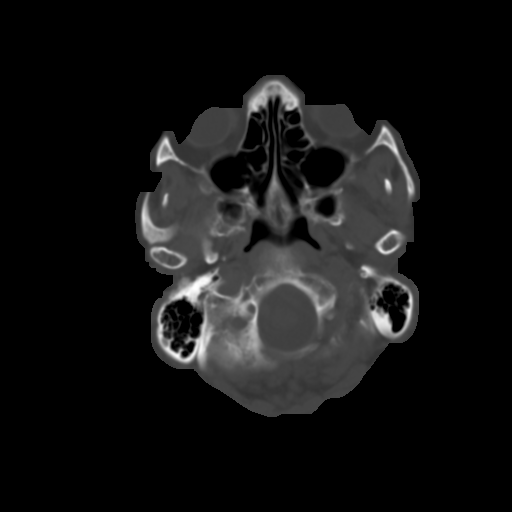
[im 7/31  brain]
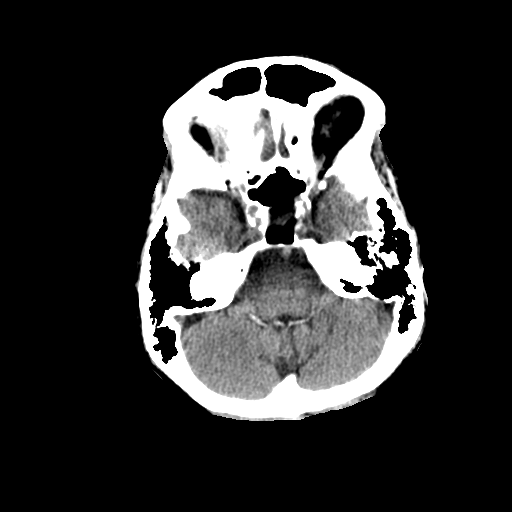
[im 10/31  brain]
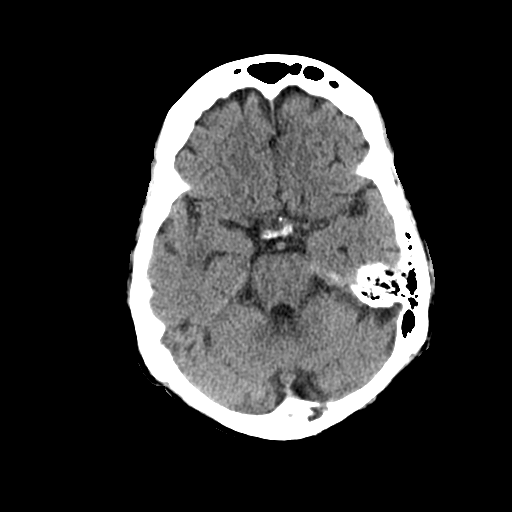
[im 14/31  brain]
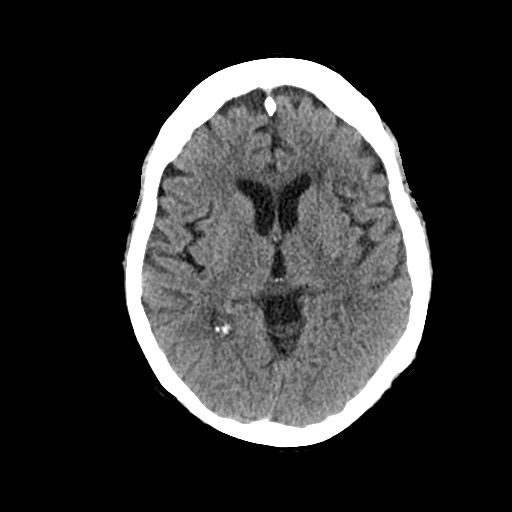
[im 17/31  brain]
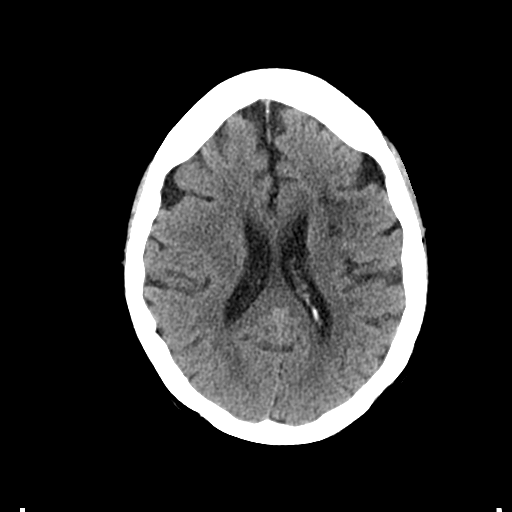
[im 17/31  bone]
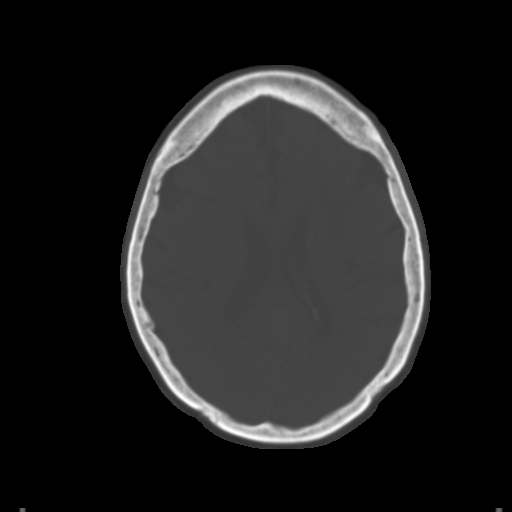
[im 21/31  brain]
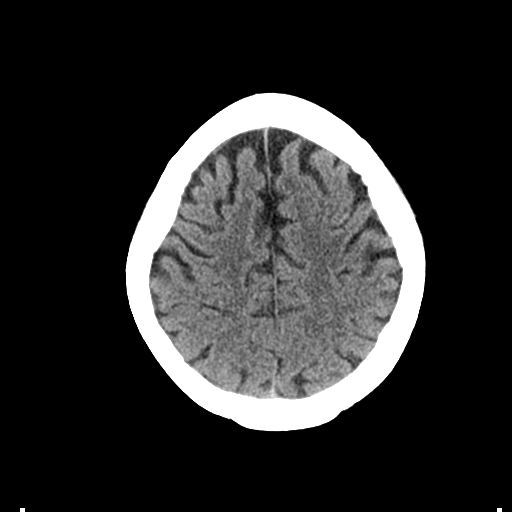
[im 24/31  brain]
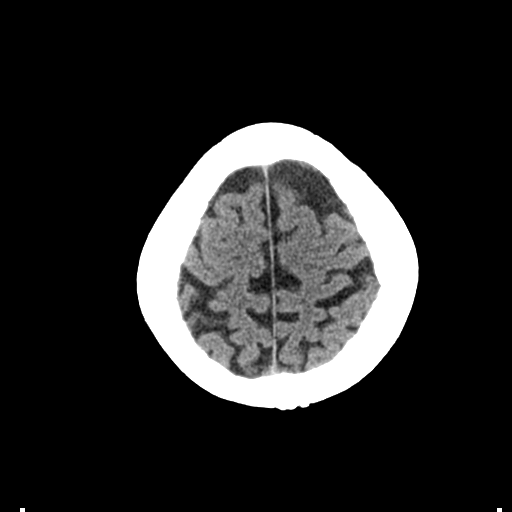
[im 28/31  brain]
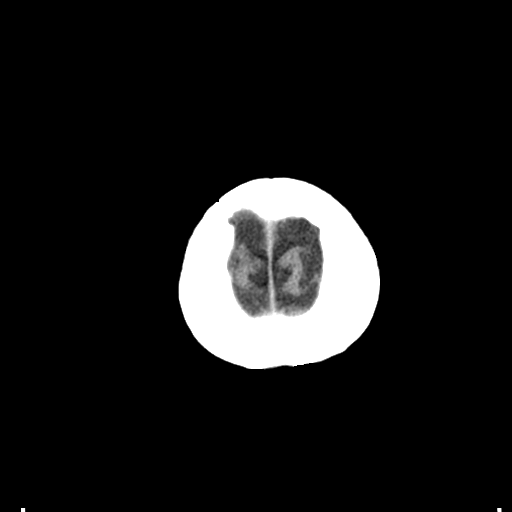

[Series 4: coronal soft tissue · coronal · 0.37mm/px · 3 of 63 slices shown]
[im 21/63  brain]
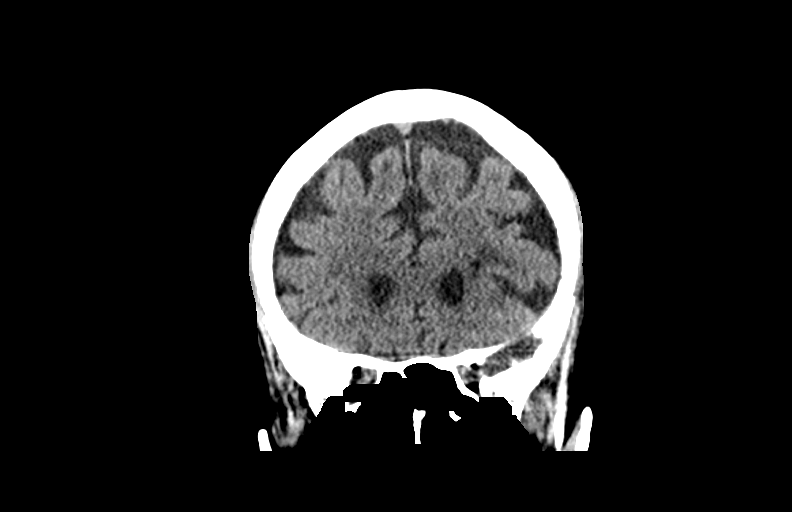
[im 28/63  brain]
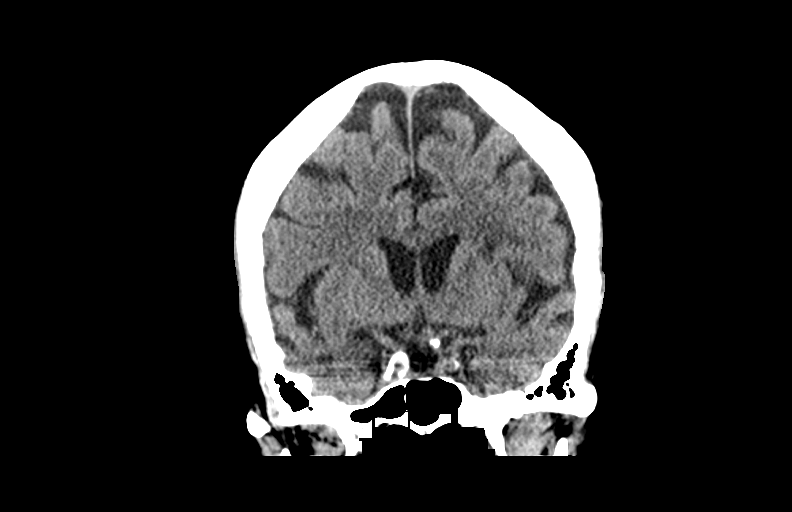
[im 35/63  brain]
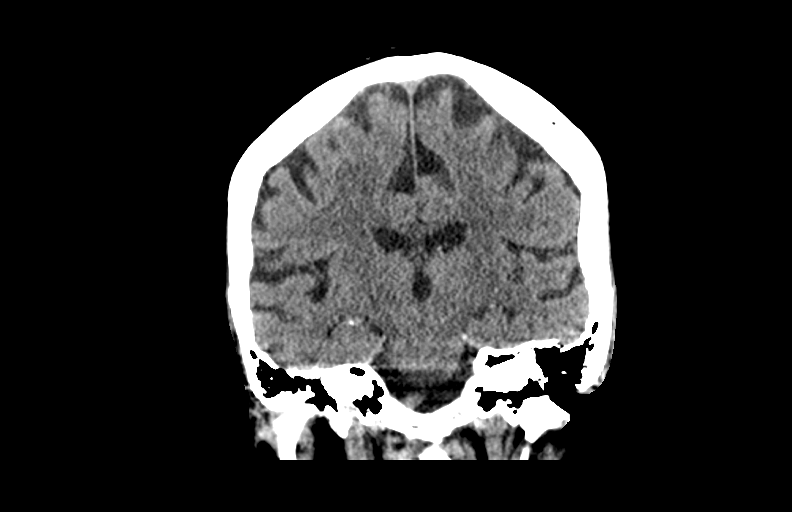

[Series 5: sagittal soft tissue · sagittal · 0.39mm/px · 3 of 50 slices shown]
[im 17/50  brain]
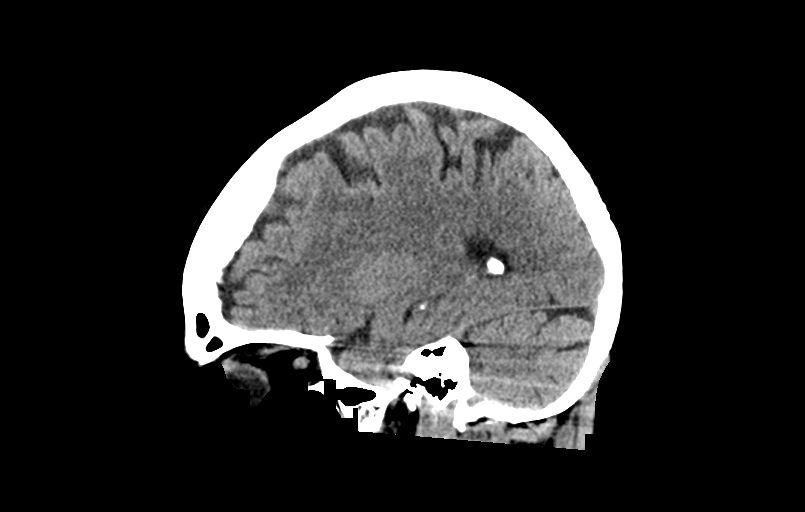
[im 25/50  brain]
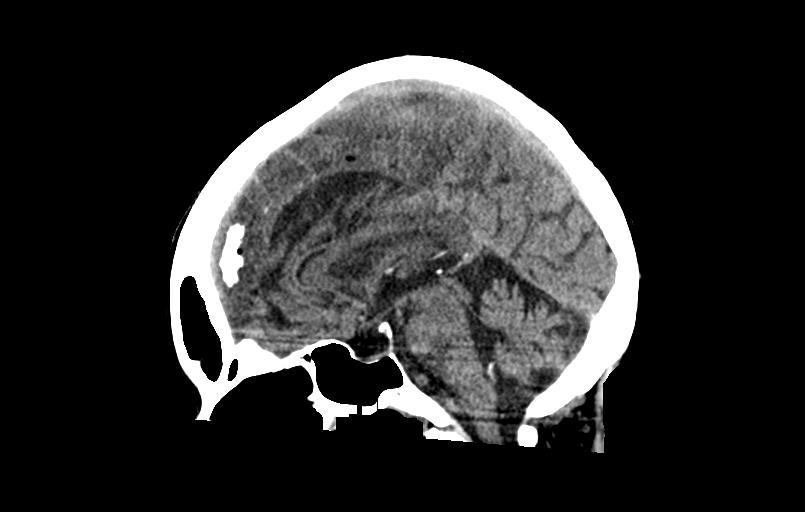
[im 33/50  brain]
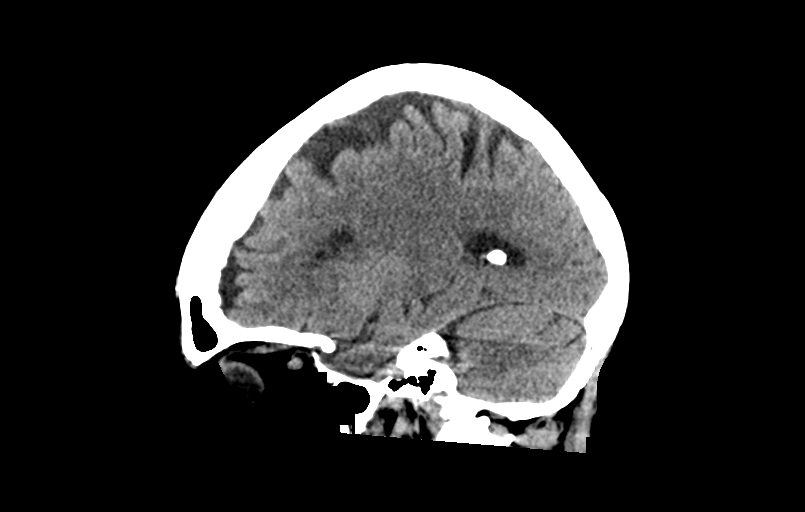

[14 of 47 positions shown; findings below may reference images not displayed]

FINDINGS: Brain: No subdural, epidural, or subarachnoid hemorrhage.
Cerebellum, brainstem, and basal cisterns are normal. Ventricles and
sulci are unremarkable. Mild white matter changes. More focal white
matter changes in the left frontal white matter not seen in October 2017. The overlying cortex is intact. No acute cortical ischemia. No
midline shift.

Vascular: Calcified atherosclerosis in the intracranial carotids.

Skull: Normal. Negative for fracture or focal lesion.

Sinuses/Orbits: No acute finding.

Other: None.
IMPRESSION: 1. New focal white matter changes in the left frontal lobe with no
overlying cortical involvement. No abnormality was seen in this
location on the November 11, 2017 MRI. The finding is otherwise age
indeterminate. This could represent an infarct within the white
matter of this region. No cortical involvement. An MRI could better
assess acuity.
2. No other acute abnormalities are identified.
